# Patient Record
Sex: Female | Born: 1950 | Hispanic: No | Marital: Married | Smoking: Former smoker
Health system: Southern US, Community
[De-identification: ages and names within clinical notes are randomized; demographics above are authoritative.]

## PROBLEM LIST (undated history)

## (undated) DIAGNOSIS — Z87442 Personal history of urinary calculi: Secondary | ICD-10-CM

## (undated) DIAGNOSIS — I10 Essential (primary) hypertension: Secondary | ICD-10-CM

## (undated) DIAGNOSIS — F329 Major depressive disorder, single episode, unspecified: Secondary | ICD-10-CM

## (undated) DIAGNOSIS — F0280 Dementia in other diseases classified elsewhere without behavioral disturbance: Secondary | ICD-10-CM

## (undated) DIAGNOSIS — M069 Rheumatoid arthritis, unspecified: Secondary | ICD-10-CM

## (undated) DIAGNOSIS — C801 Malignant (primary) neoplasm, unspecified: Secondary | ICD-10-CM

## (undated) DIAGNOSIS — B029 Zoster without complications: Secondary | ICD-10-CM

## (undated) DIAGNOSIS — M1990 Unspecified osteoarthritis, unspecified site: Secondary | ICD-10-CM

## (undated) DIAGNOSIS — K769 Liver disease, unspecified: Secondary | ICD-10-CM

## (undated) DIAGNOSIS — H9190 Unspecified hearing loss, unspecified ear: Secondary | ICD-10-CM

## (undated) DIAGNOSIS — G309 Alzheimer's disease, unspecified: Secondary | ICD-10-CM

## (undated) DIAGNOSIS — E785 Hyperlipidemia, unspecified: Secondary | ICD-10-CM

## (undated) DIAGNOSIS — F32A Depression, unspecified: Secondary | ICD-10-CM

## (undated) DIAGNOSIS — N189 Chronic kidney disease, unspecified: Secondary | ICD-10-CM

## (undated) HISTORY — PX: WRIST SURGERY: SHX841

## (undated) HISTORY — PX: BUNIONECTOMY: SHX129

## (undated) HISTORY — DX: Dementia in other diseases classified elsewhere, unspecified severity, without behavioral disturbance, psychotic disturbance, mood disturbance, and anxiety: F02.80

## (undated) HISTORY — DX: Dementia in other diseases classified elsewhere, unspecified severity, without behavioral disturbance, psychotic disturbance, mood disturbance, and anxiety: G30.9

## (undated) HISTORY — PX: TONSILLECTOMY: SUR1361

## (undated) HISTORY — PX: URETHRAL DILATION: SUR417

## (undated) HISTORY — PX: FACIAL LACERATION REPAIR: SHX6589

## (undated) HISTORY — PX: ROTATOR CUFF REPAIR: SHX139

## (undated) HISTORY — PX: BREAST EXCISIONAL BIOPSY: SUR124

## (undated) HISTORY — PX: TRIGGER FINGER RELEASE: SHX641

## (undated) HISTORY — PX: FOOT SURGERY: SHX648

## (undated) HISTORY — PX: FACIAL FRACTURE SURGERY: SHX1570

## (undated) HISTORY — PX: KIDNEY SURGERY: SHX687

## (undated) HISTORY — PX: BACK SURGERY: SHX140

---

## 2004-04-24 ENCOUNTER — Ambulatory Visit: Payer: Self-pay | Admitting: Podiatry

## 2004-06-07 ENCOUNTER — Emergency Department: Payer: Self-pay | Admitting: Emergency Medicine

## 2005-12-15 ENCOUNTER — Ambulatory Visit: Payer: Self-pay | Admitting: General Practice

## 2007-05-09 ENCOUNTER — Ambulatory Visit: Payer: Self-pay | Admitting: Obstetrics and Gynecology

## 2007-05-30 ENCOUNTER — Ambulatory Visit: Payer: Self-pay | Admitting: Obstetrics and Gynecology

## 2007-12-21 ENCOUNTER — Ambulatory Visit: Payer: Self-pay | Admitting: Obstetrics and Gynecology

## 2008-02-28 ENCOUNTER — Emergency Department: Payer: Self-pay | Admitting: Emergency Medicine

## 2008-03-02 ENCOUNTER — Inpatient Hospital Stay: Payer: Self-pay | Admitting: Internal Medicine

## 2008-03-02 ENCOUNTER — Ambulatory Visit: Payer: Self-pay | Admitting: Cardiovascular Disease

## 2008-03-08 ENCOUNTER — Ambulatory Visit: Payer: Self-pay | Admitting: Family Medicine

## 2008-06-26 ENCOUNTER — Ambulatory Visit: Payer: Self-pay | Admitting: Unknown Physician Specialty

## 2009-07-01 ENCOUNTER — Ambulatory Visit: Payer: Self-pay | Admitting: Unknown Physician Specialty

## 2010-03-14 ENCOUNTER — Ambulatory Visit: Payer: Self-pay | Admitting: Sports Medicine

## 2010-03-27 ENCOUNTER — Ambulatory Visit: Payer: Self-pay | Admitting: Anesthesiology

## 2010-04-01 ENCOUNTER — Ambulatory Visit: Payer: Self-pay | Admitting: Unknown Physician Specialty

## 2010-07-06 ENCOUNTER — Ambulatory Visit: Payer: Self-pay | Admitting: Unknown Physician Specialty

## 2011-03-18 ENCOUNTER — Ambulatory Visit: Payer: Self-pay | Admitting: Anesthesiology

## 2011-03-18 LAB — URINALYSIS, COMPLETE
Bacteria: NONE SEEN
Blood: NEGATIVE
Glucose,UR: NEGATIVE mg/dL (ref 0–75)
Ph: 6 (ref 4.5–8.0)
Specific Gravity: 1.023 (ref 1.003–1.030)
Squamous Epithelial: 1

## 2011-03-18 LAB — CBC
HCT: 38.6 % (ref 35.0–47.0)
HGB: 13 g/dL (ref 12.0–16.0)
Platelet: 324 10*3/uL (ref 150–440)
WBC: 6.5 10*3/uL (ref 3.6–11.0)

## 2011-03-18 LAB — POTASSIUM: Potassium: 3.8 mmol/L (ref 3.5–5.1)

## 2011-03-22 ENCOUNTER — Ambulatory Visit: Payer: Self-pay | Admitting: Unknown Physician Specialty

## 2011-09-27 ENCOUNTER — Ambulatory Visit: Payer: Self-pay | Admitting: Gastroenterology

## 2012-07-12 ENCOUNTER — Ambulatory Visit: Payer: Self-pay | Admitting: Obstetrics and Gynecology

## 2014-06-03 ENCOUNTER — Encounter
Admit: 2014-06-03 | Disposition: A | Discharge status: Home / Self Care | Payer: Self-pay | Attending: Rheumatology | Admitting: Rheumatology

## 2014-06-23 NOTE — Op Note (Signed)
PATIENT NAME:  Meghan Myers, Meghan Myers MR#:  [id number] DATE OF BIRTH:  02-08-1951  DATE OF PROCEDURE:  03/22/2011  PREOPERATIVE DIAGNOSIS: Carpal tunnel syndrome, right wrist.   POSTOPERATIVE DIAGNOSIS: Carpal tunnel syndrome, right wrist.   PROCEDURE PERFORMED: Open carpal tunnel release on the right.   SURGEON: Kathrene Alu., M.D.   ANESTHESIA: General.   HISTORY: The patient has a long history of carpal tunnel-type symptoms referable to her right wrist. Nerve conductions verified the fact that there was compression of her median nerve, on the right. She was ultimately brought in for an open carpal tunnel release due to her refractoriness to conservative treatment.   DESCRIPTION OF PROCEDURE: The patient was taken to the Operating Room where satisfactory general anesthesia was achieved. A tourniquet was applied to the right upper arm. The right upper extremity was then prepped and draped in the usual fashion for a wrist procedure. Next, the right upper extremity was exsanguinated and the tourniquet was inflated. A slightly curved incision was made over the volar aspect of the distal half of the transverse carpal ligament. Dissection was carried down to the palmar fascia and the distal edge of the transverse carpal ligament. A hemostat was inserted underneath the ligament to protect the median nerve and then the ligament was completely divided. The distal half was divided with a knife and the proximal half was divided with scissors. The ligament was felt to have been completely divided. The nerve was somewhat flattened underneath the ligament. There was no evidence for proliferative synovitis or mass within the carpal tunnel space.   I went ahead and injected the subcutaneous tissue, in the incision, with about 10 mL of 0.5% Marcaine without epinephrine. The tourniquet was released. It had been up 9 minutes. Bleeding was controlled with digital pressure. The wound was irrigated with saline and then  closed the wound with 4-0 nylon sutures in vertical mattress fashion. Betadine was applied to the wound followed by a sterile dressing. A fiberglass volar splint was applied to the wrist slightly dorsiflexed.   The patient was then awakened and transferred to her stretcher bed. She was taken to the Recovery Room in satisfactory condition. Blood loss was negligible.  ____________________________ Kathrene Alu., MD hbk:slb D: 03/22/2011 14:20:47 ET T: 03/22/2011 16:15:01 ET JOB#: [id number]  cc: Kathrene Alu., MD, <Dictator> Vilinda Flake, Brooke Bonito MD ELECTRONICALLY SIGNED 03/28/2011 15:24

## 2014-10-08 ENCOUNTER — Observation Stay
Admission: EM | Admit: 2014-10-08 | Discharge: 2014-10-10 | Disposition: A | Discharge status: Home / Self Care | Payer: BLUE CROSS/BLUE SHIELD | Attending: Internal Medicine | Admitting: Internal Medicine

## 2014-10-08 ENCOUNTER — Emergency Department: Payer: BLUE CROSS/BLUE SHIELD

## 2014-10-08 ENCOUNTER — Encounter: Payer: Self-pay | Admitting: Urgent Care

## 2014-10-08 DIAGNOSIS — W5501XA Bitten by cat, initial encounter: Secondary | ICD-10-CM | POA: Diagnosis not present

## 2014-10-08 DIAGNOSIS — D72829 Elevated white blood cell count, unspecified: Secondary | ICD-10-CM | POA: Diagnosis not present

## 2014-10-08 DIAGNOSIS — Z87891 Personal history of nicotine dependence: Secondary | ICD-10-CM | POA: Diagnosis not present

## 2014-10-08 DIAGNOSIS — R51 Headache: Secondary | ICD-10-CM | POA: Diagnosis not present

## 2014-10-08 DIAGNOSIS — R600 Localized edema: Secondary | ICD-10-CM | POA: Diagnosis not present

## 2014-10-08 DIAGNOSIS — I10 Essential (primary) hypertension: Secondary | ICD-10-CM | POA: Insufficient documentation

## 2014-10-08 DIAGNOSIS — S51851A Open bite of right forearm, initial encounter: Secondary | ICD-10-CM | POA: Diagnosis not present

## 2014-10-08 DIAGNOSIS — S41151A Open bite of right upper arm, initial encounter: Secondary | ICD-10-CM | POA: Diagnosis present

## 2014-10-08 DIAGNOSIS — F329 Major depressive disorder, single episode, unspecified: Secondary | ICD-10-CM | POA: Insufficient documentation

## 2014-10-08 DIAGNOSIS — L03113 Cellulitis of right upper limb: Secondary | ICD-10-CM | POA: Insufficient documentation

## 2014-10-08 DIAGNOSIS — Z888 Allergy status to other drugs, medicaments and biological substances status: Secondary | ICD-10-CM | POA: Insufficient documentation

## 2014-10-08 DIAGNOSIS — M069 Rheumatoid arthritis, unspecified: Secondary | ICD-10-CM | POA: Insufficient documentation

## 2014-10-08 DIAGNOSIS — R509 Fever, unspecified: Secondary | ICD-10-CM | POA: Diagnosis not present

## 2014-10-08 DIAGNOSIS — Z882 Allergy status to sulfonamides status: Secondary | ICD-10-CM | POA: Insufficient documentation

## 2014-10-08 DIAGNOSIS — E785 Hyperlipidemia, unspecified: Secondary | ICD-10-CM | POA: Diagnosis present

## 2014-10-08 DIAGNOSIS — E876 Hypokalemia: Secondary | ICD-10-CM | POA: Diagnosis not present

## 2014-10-08 DIAGNOSIS — I891 Lymphangitis: Secondary | ICD-10-CM | POA: Diagnosis present

## 2014-10-08 DIAGNOSIS — R000 Tachycardia, unspecified: Secondary | ICD-10-CM | POA: Diagnosis not present

## 2014-10-08 DIAGNOSIS — F32A Depression, unspecified: Secondary | ICD-10-CM | POA: Diagnosis present

## 2014-10-08 DIAGNOSIS — Z7982 Long term (current) use of aspirin: Secondary | ICD-10-CM | POA: Insufficient documentation

## 2014-10-08 HISTORY — DX: Unspecified osteoarthritis, unspecified site: M19.90

## 2014-10-08 HISTORY — DX: Liver disease, unspecified: K76.9

## 2014-10-08 HISTORY — DX: Hyperlipidemia, unspecified: E78.5

## 2014-10-08 HISTORY — DX: Rheumatoid arthritis, unspecified: M06.9

## 2014-10-08 HISTORY — DX: Zoster without complications: B02.9

## 2014-10-08 HISTORY — DX: Major depressive disorder, single episode, unspecified: F32.9

## 2014-10-08 HISTORY — DX: Depression, unspecified: F32.A

## 2014-10-08 HISTORY — DX: Essential (primary) hypertension: I10

## 2014-10-08 LAB — COMPREHENSIVE METABOLIC PANEL
ALBUMIN: 4 g/dL (ref 3.5–5.0)
ALK PHOS: 62 U/L (ref 38–126)
ALT: 23 U/L (ref 14–54)
ANION GAP: 9 (ref 5–15)
AST: 37 U/L (ref 15–41)
BUN: 22 mg/dL — AB (ref 6–20)
CHLORIDE: 99 mmol/L — AB (ref 101–111)
CO2: 29 mmol/L (ref 22–32)
Calcium: 9.5 mg/dL (ref 8.9–10.3)
Creatinine, Ser: 0.71 mg/dL (ref 0.44–1.00)
GFR calc Af Amer: >60 mL/min (ref >60)
Glucose, Bld: 130 mg/dL — ABNORMAL HIGH (ref 65–99)
POTASSIUM: 3.7 mmol/L (ref 3.5–5.1)
Sodium: 137 mmol/L (ref 135–145)
Total Bilirubin: 0.6 mg/dL (ref 0.3–1.2)
Total Protein: 7.1 g/dL (ref 6.5–8.1)

## 2014-10-08 LAB — CBC WITH DIFFERENTIAL/PLATELET
Basophils Absolute: 0 10*3/uL (ref 0–0.1)
Basophils Relative: 0 %
EOS PCT: 0 %
Eosinophils Absolute: 0 10*3/uL (ref 0–0.7)
HEMATOCRIT: 39.8 % (ref 35.0–47.0)
Hemoglobin: 13.4 g/dL (ref 12.0–16.0)
LYMPHS ABS: 1 10*3/uL (ref 1.0–3.6)
Lymphocytes Relative: 8 %
MCH: 29.1 pg (ref 26.0–34.0)
MCHC: 33.7 g/dL (ref 32.0–36.0)
MCV: 86.3 fL (ref 80.0–100.0)
MONO ABS: 0.6 10*3/uL (ref 0.2–0.9)
MONOS PCT: 5 %
NEUTROS PCT: 87 %
Neutro Abs: 11.3 10*3/uL — ABNORMAL HIGH (ref 1.4–6.5)
PLATELETS: 247 10*3/uL (ref 150–440)
RBC: 4.62 MIL/uL (ref 3.80–5.20)
RDW: 13 % (ref 11.5–14.5)
WBC: 13 10*3/uL — ABNORMAL HIGH (ref 3.6–11.0)

## 2014-10-08 MED ORDER — PIPERACILLIN-TAZOBACTAM 3.375 G IVPB
3.3750 g | Freq: Once | INTRAVENOUS | Status: AC
  Administered 2014-10-08: 3.375 g via INTRAVENOUS

## 2014-10-08 MED ORDER — PIPERACILLIN SOD-TAZOBACTAM SO 3.375 (3-0.375) G IV SOLR
INTRAVENOUS | Status: AC
  Filled 2014-10-08: qty 3.38

## 2014-10-08 MED ORDER — CLINDAMYCIN PHOSPHATE 600 MG/50ML IV SOLN: 600.0000 mg | Freq: Once | INTRAVENOUS | Status: DC

## 2014-10-08 NOTE — ED Notes (Signed)
Patient presents with cat bite to RFA that happened yesterday. Arm with redness noted around puncture area. (+) fever at home with a tmax of 101.3. Patient was started on Augmentin this afternoon.

## 2014-10-08 NOTE — ED Notes (Signed)
Patient transported to Ultrasound 

## 2014-10-08 NOTE — ED Notes (Addendum)
Patient presents to with cat bite on right forearm. Patient reports her cat bit her yesterday evening, was seen at [facility] today and prescribed Augmentin. Patient reports redness and pain increasing this evening. Redness and swelling noted to right forearm, red streak noticed going up right arm. Skin hot to touch, reports fevers at home up to 101. Patient also c/o headache since today. Patient able to open and close hand but reports painful to do so. Patient states "my lymph nodes feel swollen," patient pointing to right armpit. Patient states cat has had his vaccines. Patient alert and oriented x 4, respirations even and unlabored. Call bell within reach, family at bedside.

## 2014-10-08 NOTE — H&P (Signed)
[facility] NAME: Felesha Moncrieffe    MR#:  [id number]  DATE OF BIRTH:  1950-03-09  DATE OF ADMISSION:  10/08/2014  PRIMARY CARE PHYSICIAN: Dion Body, MD   REQUESTING/REFERRING PHYSICIAN: Jacqualine Code, MD  CHIEF COMPLAINT:   Chief Complaint  Patient presents with  . Animal Bite    HISTORY OF PRESENT ILLNESS:  Meghan Myers  is a 64 y.o. female who presents with Cat Bite of the right upper extremity. Patient states that she was bitten by her cat just over 24 hours ago. She washed the puncture wound site with soap and water. However, earlier this morning she started to notice erythema around puncture sites. She went to a clinic to be seen, and was given Augmentin. However, her cellulitis progressed from a small area of about 2 cm in diameter this morning to a large confluent area covering his portion of her forearm and tracking up past her antecubital fossa to her proximal upper arm. She also noticed that she developed axillary lymph nodes on that side. So she decided to come to the ED for evaluation. She states that she did have a fever at home to 101.3, and here in the ED she was found to be mildly tachycardic at 103, although her temp here was only 99. Hospitalists were called for admission for cellulitis.  PAST MEDICAL HISTORY:   Past Medical History  Diagnosis Date  . Hypertension   . HLD (hyperlipidemia)   . Rheumatoid arthritis   . Liver disease   . Shingles   . Depression   . Osteoarthritis     PAST SURGICAL HISTORY:   Past Surgical History  Procedure Laterality Date  . Wrist surgery    . Bunionectomy Bilateral     SOCIAL HISTORY:   History  Substance Use Topics  . Smoking status: Former Smoker -- 0.50 packs/day for 4 years    Types: Cigarettes    Quit date: 10/07/1985  . Smokeless tobacco: Not on file  . Alcohol Use: 0.0 oz/week    0 Standard drinks or equivalent per week    FAMILY HISTORY:   Family  History  Problem Relation Age of Onset  . Hypertension Mother   . COPD Mother   . Stroke Mother   . Hypertension Father   . COPD Father   . Heart attack Father     DRUG ALLERGIES:   Allergies  Allergen Reactions  . Meloxicam Other (See Comments)    "disturbs my liver"   . Sulfasalazine     MEDICATIONS AT HOME:   Prior to Admission medications   Medication Sig Start Date End Date Taking? Authorizing Provider  amphetamine-dextroamphetamine (ADDERALL XR) 25 MG 24 hr capsule Take 25 mg by mouth every morning.   Yes Historical Provider, MD  aspirin 81 MG tablet Take 81 mg by mouth daily.   Yes Historical Provider, MD  atorvastatin (LIPITOR) 20 MG tablet Take 20 mg by mouth daily.   Yes Historical Provider, MD  hydrochlorothiazide (MICROZIDE) 12.5 MG capsule Take 12.5 mg by mouth daily.   Yes Historical Provider, MD  sertraline (ZOLOFT) 100 MG tablet Take 100 mg by mouth daily.   Yes Historical Provider, MD    REVIEW OF SYSTEMS:  Review of Systems  Constitutional: Positive for fever. Negative for chills, weight loss and malaise/fatigue.  HENT: Negative for ear pain, hearing loss and tinnitus.   Eyes: Negative for blurred vision, double vision, pain and redness.  Respiratory:  Negative for cough, hemoptysis and shortness of breath.   Cardiovascular: Negative for chest pain, palpitations, orthopnea and leg swelling.  Gastrointestinal: Negative for nausea, vomiting, abdominal pain, diarrhea and constipation.  Genitourinary: Negative for dysuria, frequency and hematuria.  Musculoskeletal: Negative for back pain, joint pain and neck pain.  Skin:       Right upper extremity erythema, No acne, or lesions  Neurological: Negative for dizziness, tremors, focal weakness and weakness.  Endo/Heme/Allergies: Negative for polydipsia. Does not bruise/bleed easily.  Psychiatric/Behavioral: Negative for depression. The patient is not nervous/anxious and does not have insomnia.      VITAL  SIGNS:   Filed Vitals:   10/08/14 2148  BP: 133/78  Pulse: 103  Temp: 99.4 F (37.4 C)  TempSrc: Oral  Resp: 18  Height: 5\' 2"  (1.575 m)  Weight: 63.504 kg (140 lb)  SpO2: 100%   Wt Readings from Last 3 Encounters:  10/08/14 63.504 kg (140 lb)    PHYSICAL EXAMINATION:  Physical Exam  Vitals reviewed. Constitutional: She is oriented to person, place, and time. She appears well-developed and well-nourished. No distress.  HENT:  Head: Normocephalic and atraumatic.  Mouth/Throat: Oropharynx is clear and moist.  Eyes: Conjunctivae and EOM are normal. Pupils are equal, round, and reactive to light. No scleral icterus.  Neck: Normal range of motion. Neck supple. No JVD present. No thyromegaly present.  Cardiovascular: Regular rhythm and intact distal pulses.  Exam reveals no gallop and no friction rub.   No murmur heard. Mild tachycardia  Respiratory: Effort normal and breath sounds normal. No respiratory distress. She has no wheezes. She has no rales.  GI: Soft. Bowel sounds are normal. She exhibits no distension. There is no tenderness.  Musculoskeletal: Normal range of motion. She exhibits no edema.  No arthritis, no gout  Lymphadenopathy:    She has no cervical adenopathy.  Neurological: She is alert and oriented to person, place, and time. No cranial nerve deficit.  No dysarthria, no aphasia  Skin: Skin is warm and dry. No rash noted. There is erythema (right upper extremity).  Psychiatric: She has a normal mood and affect. Her behavior is normal. Judgment and thought content normal.    LABORATORY PANEL:   CBC No results for input(s): WBC, HGB, HCT, PLT in the last 168 hours. ------------------------------------------------------------------------------------------------------------------  Chemistries  No results for input(s): NA, K, CL, CO2, GLUCOSE, BUN, CREATININE, CALCIUM, MG, AST, ALT, ALKPHOS, BILITOT in the last 168 hours.  Invalid input(s):  GFRCGP ------------------------------------------------------------------------------------------------------------------  Cardiac Enzymes No results for input(s): TROPONINI in the last 168 hours. ------------------------------------------------------------------------------------------------------------------  RADIOLOGY:  No results found.  EKG:   Orders placed or performed in visit on 03/27/10  . EKG 12-Lead    IMPRESSION AND PLAN:  Principal Problem:   Cellulitis of arm, right - ultrasound ordered in the ED to rule out any abscesses, Zosyn ordered, will continue this for now, blood culture sent. Active Problems:   Cat bite of right forearm - Zosyn to cover for feline oral bacteria   Depression - home meds   HLD (hyperlipidemia) - home meds   HTN (hypertension) - home dose HCTZ  All the records are reviewed and case discussed with ED provider. Management plans discussed with the patient and/or family.  DVT PROPHYLAXIS: mechanical only with early ambulation  ADMISSION STATUS: Observation  CODE STATUS: full  TOTAL TIME TAKING CARE OF THIS PATIENT: 40 minutes.    Eymi Lipuma FIELDING 10/08/2014, 11:07 PM  [facility]  Office  [phone]  CC: Primary care physician; Dion Body, MD

## 2014-10-08 NOTE — ED Provider Notes (Signed)
Medical screening examination/treatment/procedure(s) were conducted as a shared visit with non-physician practitioner(s) and myself.  I personally evaluated the patient during the encounter.  Personally saw to evaluate the patient. She has a low-grade fever associated with a cat bite. She has streaking cellulitis from the right forearm moving up into the left axilla with associated left axillary tenderness and adenopathy. Based on the rapid advancement sialitis, also already on Augmentin outpatient from a rabies immune cat. We will initiate her on IV abx admitted to the hospitalist for further observation of cellulitis.  Appears significant cellulitis likely due to cat scratch fever, with associated lymphangitic spread. Based on the rapid advancement of her illness, the patient's self report of fever to 101 and tachycardia in the ER we will admit her for IV antibiotic's.  Delman Kitten, MD 10/08/14 2239

## 2014-10-08 NOTE — ED Provider Notes (Signed)
[facility] Emergency Department Provider Note  ____________________________________________  Time seen: Approximately 10:36 PM  I have reviewed the triage vital signs and the nursing notes.   HISTORY  Chief Complaint Animal Bite   HPI Meghan Myers is a 64 y.o. female presenting to the emergency department with a cat bite to the right forearm sustained last night. She was seen at [facility] clinic earlier today and was started on Augmentin.  She noticed that the area around the bite was growing increasingly red and spreading as the day went on.  She has noticed a red streak up her forearm and pus coming from the initial wound.  She now has pain and swelling of the right axillary lymph node region, headache, and self-reported fever of 101.  She has full ROM of her hand and arm with some pain.   Past Medical History  Diagnosis Date  . Hypertension   . HLD (hyperlipidemia)   . Rheumatoid arthritis   . Liver disease   . Shingles   . Depression   . Osteoarthritis     Patient Active Problem List   Diagnosis Date Noted  . Cat bite of right forearm 10/08/2014  . Cellulitis of arm, right 10/08/2014  . Depression 10/08/2014  . HLD (hyperlipidemia) 10/08/2014  . HTN (hypertension) 10/08/2014    Past Surgical History  Procedure Laterality Date  . Wrist surgery    . Bunionectomy Bilateral     Current Outpatient Rx  Name  Route  Sig  Dispense  Refill  . amphetamine-dextroamphetamine (ADDERALL XR) 25 MG 24 hr capsule   Oral   Take 25 mg by mouth every morning.         Marland Kitchen aspirin 81 MG tablet   Oral   Take 81 mg by mouth daily.         Marland Kitchen atorvastatin (LIPITOR) 20 MG tablet   Oral   Take 20 mg by mouth daily.         . hydrochlorothiazide (MICROZIDE) 12.5 MG capsule   Oral   Take 12.5 mg by mouth daily.         . sertraline (ZOLOFT) 100 MG tablet   Oral   Take 100 mg by mouth daily.           Allergies Meloxicam and  Sulfasalazine  Family History  Problem Relation Age of Onset  . Hypertension Mother   . COPD Mother   . Stroke Mother   . Hypertension Father   . COPD Father   . Heart attack Father     Social History History  Substance Use Topics  . Smoking status: Former Smoker -- 0.50 packs/day for 4 years    Types: Cigarettes    Quit date: 10/07/1985  . Smokeless tobacco: Not on file  . Alcohol Use: 0.0 oz/week    0 Standard drinks or equivalent per week    Review of Systems Constitutional: Self-reported fever of 101 and headache. Eyes: No visual changes. ENT: No sore throat. Cardiovascular: Denies chest pain. Respiratory: Denies shortness of breath. Gastrointestinal: No abdominal pain.  No nausea, no vomiting.  No diarrhea.  No constipation. Genitourinary: Negative for dysuria. Musculoskeletal: Negative for back pain. Skin: Cat bite to the right forearm. Increasing redness and swelling of the right forearm with red streaking up toward the right armpit.  Neurological: Positive for headache. Negative for focal weakness or numbness.  10-point ROS otherwise negative.  ____________________________________________   PHYSICAL EXAM:  VITAL SIGNS: ED Triage Vitals  Enc Vitals Group     BP 10/08/14 2148 133/78 mmHg     Pulse Rate 10/08/14 2148 103     Resp 10/08/14 2148 18     Temp 10/08/14 2148 99.4 F (37.4 C)     Temp Source 10/08/14 2148 Oral     SpO2 10/08/14 2148 100 %     Weight 10/08/14 2148 140 lb (63.504 kg)     Height 10/08/14 2148 5\' 2"  (1.575 m)     Head Cir --      Peak Flow --      Pain Score 10/08/14 2149 6     Pain Loc --      Pain Edu? --      Excl. in [place]? --    Constitutional: Alert and oriented. Well appearing and in no acute distress. Eyes: Conjunctivae are normal. PERRL. EOMI. Head: Atraumatic. Nose: No congestion/rhinnorhea. Mouth/Throat: Mucous membranes are moist.  Oropharynx non-erythematous. Neck: No stridor.    Hematological/Lymphatic/Immunilogical: Right axillary lymphadenopathy with tenderness to palpation. Cardiovascular: Normal rate, regular rhythm. Grossly normal heart sounds.  Good peripheral circulation. Respiratory: Normal respiratory effort.  No retractions. Lungs CTAB. Gastrointestinal: Soft and nontender. No distention. No abdominal bruits. No CVA tenderness. Musculoskeletal: No lower extremity tenderness nor edema.  No joint effusions. Neurologic:  Normal speech and language. No gross focal neurologic deficits are appreciated. No gait instability. Skin:  Cat bite to the right forearm.  Cellulitis of the right forearm with erythema and warmth.  Lymphangitis noted up the right forearm toward the right axilla. Psychiatric: Mood and affect are normal. Speech and behavior are normal.  ____________________________________________   LABS (all labs ordered are listed, but only abnormal results are displayed)  Labs Reviewed  CULTURE, BLOOD (ROUTINE X 2)  CULTURE, BLOOD (ROUTINE X 2)  COMPREHENSIVE METABOLIC PANEL  CBC WITH DIFFERENTIAL/PLATELET   ____________________________________________   RADIOLOGY  Ultrasound right arm limited pending at the time of this note. ____________________________________________   PROCEDURES  Procedure(s) performed: None  Critical Care performed: No  ____________________________________________   INITIAL IMPRESSION / ASSESSMENT AND PLAN / ED COURSE  Pertinent labs & imaging results that were available during my care of the patient were reviewed by me and considered in my medical decision making (see chart for details).  Patient presented to the emergency room with worsening symptoms from a cat bite sustained last night. She started her first dose of Augmentin after being seen at [facility].  She now presents with cellulitis and lymphangitis of the right forearm.  Labs collected as above. IV Zosyn started in the emergency room, [place] of right  forearm completed to rule out abscess formation, and Hospitalist consulted for admission to the floor for further evaluation and observation. ____________________________________________   FINAL CLINICAL IMPRESSION(S) / ED DIAGNOSES  Final diagnoses:  Cat bite of right upper arm, initial encounter  Lymphangitis      Arlyss Repress, PA-C 10/08/14 [id number]  Delman Kitten, MD 10/08/14 2359

## 2014-10-09 LAB — CBC
HCT: 39.6 % (ref 35.0–47.0)
Hemoglobin: 13.2 g/dL (ref 12.0–16.0)
MCH: 28.9 pg (ref 26.0–34.0)
MCHC: 33.3 g/dL (ref 32.0–36.0)
MCV: 87 fL (ref 80.0–100.0)
Platelets: 232 10*3/uL (ref 150–440)
RBC: 4.55 MIL/uL (ref 3.80–5.20)
RDW: 12.9 % (ref 11.5–14.5)
WBC: 10.7 10*3/uL (ref 3.6–11.0)

## 2014-10-09 LAB — BASIC METABOLIC PANEL
Anion gap: 9 (ref 5–15)
BUN: 17 mg/dL (ref 6–20)
CO2: 29 mmol/L (ref 22–32)
CREATININE: 0.73 mg/dL (ref 0.44–1.00)
Calcium: 9.2 mg/dL (ref 8.9–10.3)
Chloride: 99 mmol/L — ABNORMAL LOW (ref 101–111)
GLUCOSE: 120 mg/dL — AB (ref 65–99)
Potassium: 3.1 mmol/L — ABNORMAL LOW (ref 3.5–5.1)
Sodium: 137 mmol/L (ref 135–145)

## 2014-10-09 LAB — MAGNESIUM: Magnesium: 1.6 mg/dL — ABNORMAL LOW (ref 1.7–2.4)

## 2014-10-09 MED ORDER — SERTRALINE HCL 50 MG PO TABS
100.0000 mg | ORAL_TABLET | Freq: Every day | ORAL | Status: DC
  Administered 2014-10-09: 09:00:00 100 mg via ORAL
  Filled 2014-10-09 (×2): qty 2

## 2014-10-09 MED ORDER — ONDANSETRON HCL 4 MG/2ML IJ SOLN: 4.0000 mg | Freq: Four times a day (QID) | INTRAMUSCULAR | Status: DC | PRN

## 2014-10-09 MED ORDER — HYDROCHLOROTHIAZIDE 12.5 MG PO CAPS
12.5000 mg | ORAL_CAPSULE | Freq: Every day | ORAL | Status: DC
  Administered 2014-10-09: 12.5 mg via ORAL
  Filled 2014-10-09 (×2): qty 1

## 2014-10-09 MED ORDER — ACETAMINOPHEN 325 MG PO TABS: 650.0000 mg | ORAL_TABLET | Freq: Four times a day (QID) | ORAL | Status: DC | PRN

## 2014-10-09 MED ORDER — POTASSIUM CHLORIDE 20 MEQ PO PACK
40.0000 meq | PACK | Freq: Once | ORAL | Status: AC
  Administered 2014-10-09: 10:00:00 40 meq via ORAL

## 2014-10-09 MED ORDER — PIPERACILLIN-TAZOBACTAM 4.5 G IVPB
4.5000 g | Freq: Three times a day (TID) | INTRAVENOUS | Status: DC
  Administered 2014-10-09 (×2): 4.5 g via INTRAVENOUS
  Filled 2014-10-09 (×5): qty 100

## 2014-10-09 MED ORDER — ATORVASTATIN CALCIUM 20 MG PO TABS
20.0000 mg | ORAL_TABLET | Freq: Every day | ORAL | Status: DC
  Administered 2014-10-09: 09:00:00 20 mg via ORAL
  Filled 2014-10-09 (×2): qty 1

## 2014-10-09 MED ORDER — HYDROCODONE-ACETAMINOPHEN 5-325 MG PO TABS: 1.0000 | ORAL_TABLET | ORAL | Status: DC | PRN

## 2014-10-09 MED ORDER — ONDANSETRON HCL 4 MG PO TABS: 4.0000 mg | ORAL_TABLET | Freq: Four times a day (QID) | ORAL | Status: DC | PRN

## 2014-10-09 MED ORDER — HYDROCODONE-ACETAMINOPHEN 5-325 MG PO TABS
ORAL_TABLET | ORAL | Status: AC
  Administered 2014-10-09: 01:00:00 2
  Filled 2014-10-09: qty 2

## 2014-10-09 MED ORDER — ASPIRIN 81 MG PO CHEW
81.0000 mg | CHEWABLE_TABLET | Freq: Every day | ORAL | Status: DC
  Administered 2014-10-09: 09:00:00 81 mg via ORAL
  Filled 2014-10-09: qty 1

## 2014-10-09 MED ORDER — PIPERACILLIN-TAZOBACTAM 3.375 G IVPB
3.3750 g | Freq: Three times a day (TID) | INTRAVENOUS | Status: DC
  Administered 2014-10-09 – 2014-10-10 (×2): 3.375 g via INTRAVENOUS
  Filled 2014-10-09 (×6): qty 50

## 2014-10-09 MED ORDER — SODIUM CHLORIDE 0.9 % IJ SOLN
3.0000 mL | Freq: Two times a day (BID) | INTRAMUSCULAR | Status: DC
  Administered 2014-10-09 (×3): 3 mL via INTRAVENOUS

## 2014-10-09 MED ORDER — ACETAMINOPHEN 650 MG RE SUPP: 650.0000 mg | Freq: Four times a day (QID) | RECTAL | Status: DC | PRN

## 2014-10-09 NOTE — Plan of Care (Signed)
Problem: Discharge Progression Outcomes Goal: Discharge plan in place and appropriate Individualism Outcome: Progressing Pt comes from home where her cat bit her. Arm redness and swelling in the 24 hours since she was bitten.    Goal: Other Discharge Outcomes/Goals Outcome: Progressing Plan of care progress to goal: Pain - pt has minimal pain Hemodynamically stable - VSS Complications - redness has not spread beyond marker lines as of this point in time Activity - pt up ad lib

## 2014-10-09 NOTE — Plan of Care (Signed)
Problem: Discharge Progression Outcomes Goal: Other Discharge Outcomes/Goals Outcome: Progressing Redness around cat bite has decreased and pain is minimal. Pt continued on IV abt and will change to PO tomorrow, likely discharge tomorrow home with husband.Vitals stable, minor elvated fever this afternoon 99.2. Increased PO intake of water and supplemental Potassium with  Po powder.

## 2014-10-09 NOTE — Progress Notes (Signed)
Pt Continued on Zosyn and will switch to oral Augmentin when clinically ready. No complaints of pain. Urine drug screen ordered, tech notified of UA order. VSS. Discharge home in 1-2 days per Dr's note.

## 2014-10-09 NOTE — Progress Notes (Signed)
[facility] NAME: Nona Gracey    MR#:  [id number]  DATE OF BIRTH:  [birth year]  SUBJECTIVE:  CHIEF COMPLAINT:   Chief Complaint  Patient presents with  . Animal Bite   Patient is a 64 year old female who presents to the hospital with fever to 101, tachycardia and leukocytosis after Bite in the Right Upper Extremity and Developing Erythema, Swelling and Painful Area around the Bite. Patient was initiated on antibiotic with Zosyn and admitted to the hospital. However, she did not notice he had significant improvement in erythema, swelling, site. But no significant progression was noted ROS  VITAL SIGNS: Blood pressure 119/50, pulse 85, temperature 98.7 F (37.1 C), temperature source Oral, resp. rate 18, height 5\' 2"  (1.575 m), weight 62.324 kg (137 lb 6.4 oz), SpO2 100 %.  PHYSICAL EXAMINATION:   GENERAL:  64 y.o.-year-old patient lying in the bed with no acute distress.  EYES: Pupils equal, round, reactive to light and accommodation. No scleral icterus. Extraocular muscles intact.  HEENT: Head atraumatic, normocephalic. Oropharynx and nasopharynx clear.  NECK:  Supple, no jugular venous distention. No thyroid enlargement, no tenderness.  LUNGS: Normal breath sounds bilaterally, no wheezing, rales,rhonchi or crepitation. No use of accessory muscles of respiration.  CARDIOVASCULAR: S1, S2 normal. No murmurs, rubs, or gallops.  ABDOMEN: Soft, nontender, nondistended. Bowel sounds present. No organomegaly or mass.  EXTREMITIES: No pedal edema, cyanosis, or clubbing. Right has extensive swelling around the antecubital area and forearm mental area with erythema, increased warmth as well as some discomfort on palpation extending tracking to the axilla, mild discomfort on active to palpation but no significant enlargement of lymph nodes were discovered. No purulence of fluctuation. Bite site was noted in forearm NEUROLOGIC: Cranial nerves  II through XII are intact. Muscle strength 5/5 in all extremities. Sensation intact. Gait not checked.  PSYCHIATRIC: The patient is alert and oriented x 3.  SKIN: No obvious rash, lesion, or ulcer.   ORDERS/RESULTS REVIEWED:   CBC  Recent Labs Lab 10/08/14 2255 10/09/14 0448  WBC 13.0* 10.7  HGB 13.4 13.2  HCT 39.8 39.6  PLT 247 232  MCV 86.3 87.0  MCH 29.1 28.9  MCHC 33.7 33.3  RDW 13.0 12.9  LYMPHSABS 1.0  --   MONOABS 0.6  --   EOSABS 0.0  --   BASOSABS 0.0  --    ------------------------------------------------------------------------------------------------------------------  Chemistries   Recent Labs Lab 10/08/14 2255 10/09/14 0448  NA 137 137  K 3.7 3.1*  CL 99* 99*  CO2 29 29  GLUCOSE 130* 120*  BUN 22* 17  CREATININE 0.71 0.73  CALCIUM 9.5 9.2  AST 37  --   ALT 23  --   ALKPHOS 62  --   BILITOT 0.6  --    ------------------------------------------------------------------------------------------------------------------ estimated creatinine clearance is 60.9 mL/min (by C-G formula based on Cr of 0.73). ------------------------------------------------------------------------------------------------------------------ No results for input(s): TSH, T4TOTAL, T3FREE, THYROIDAB in the last 72 hours.  Invalid input(s): FREET3  Cardiac Enzymes No results for input(s): CKMB, TROPONINI, MYOGLOBIN in the last 168 hours.  Invalid input(s): CK ------------------------------------------------------------------------------------------------------------------ Invalid input(s): POCBNP ---------------------------------------------------------------------------------------------------------------  RADIOLOGY: [place] Extrem Up Right Ltd  10/09/2014   CLINICAL DATA:  Swelling and redness right forearm due to cat bite. Inflammation extends from the bite site approximately 3 cm proximal to the wrist to the antecubital fossa  EXAM: ULTRASOUND RIGHT UPPER EXTREMITY LIMITED   TECHNIQUE: Ultrasound examination of the upper extremity soft tissues was  performed in the area of clinical concern in the right forearm region.  COMPARISON:  None  FINDINGS: There is no mass or abnormal fluid collection in the right forearm region. Focal soft tissue edema is noted at the site of the cat bite approximately 3 cm from the wrist. There is no appreciable air in the area of clinical concern.  IMPRESSION: Localized edema at the site of the bite approximately 3 cm from the wrist. No mass or abnormal fluid collection is seen in the right forearm. No abscess or soft tissue air appreciable.   Electronically Signed   By: Lowella Grip III M.D.   On: 10/09/2014 00:07    EKG:  Orders placed or performed in visit on 03/27/10  . EKG 12-Lead    ASSESSMENT AND PLAN:  Principal Problem:   Cellulitis of arm, right Active Problems:   Cat bite of right forearm   Depression   HLD (hyperlipidemia)   HTN (hypertension) 1. Right upper extremity cellulitis due to cats bite, continue patient on Zosyn. Follow clinically. Change her back to Augmentin orally when she is improving 2. Leukocytosis, resolved with therapy 3. Hypokalemia, supplement orally 4. History of essential hypertension, well-controlled on current medications   Management plans discussed with the patient, family and they are in agreement.   DRUG ALLERGIES:  Allergies  Allergen Reactions  . Meloxicam Other (See Comments)    "disturbs my liver"   . Sulfasalazine Nausea Only    CODE STATUS:     Code Status Orders        Start     Ordered   10/09/14 0021  Full code   Continuous     10/09/14 0020    Advance Directive Documentation        Most Recent Value   Type of Advance Directive  Living will, Healthcare Power of Attorney   Pre-existing out of facility DNR order (yellow form or pink MOST form)     "MOST" Form in Place?        TOTAL TIME TAKING CARE OF THIS PATIENT: 35 minutes.    Theodoro Grist M.D on  10/09/2014 at 9:10 AM  Between 7am to 6pm - Pager - [phone]  After 6pm go to [email or web address] - password [email or web address]  Office  [phone]  CC: Primary care physician; Dion Body, MD

## 2014-10-09 NOTE — Progress Notes (Signed)
ANTIBIOTIC CONSULT NOTE - FOLLOW UP  Pharmacy Consult for Zosyn Indication: cellulitis/cat bite  Allergies  Allergen Reactions  . Meloxicam Other (See Comments)    "disturbs my liver"   . Sulfasalazine Nausea Only    Patient Measurements: Height: 5\' 2"  (157.5 cm) Weight: 137 lb 6.4 oz (62.324 kg) IBW/kg (Calculated) : 50.1 Adjusted Body Weight:  Vital Signs: Temp: 99.3 F (37.4 C) (08/10 1225) Temp Source: Oral (08/10 1225) BP: 110/66 mmHg (08/10 1225) Pulse Rate: 88 (08/10 1225)  Labs:  Recent Labs  10/08/14 2255 10/09/14 0448  WBC 13.0* 10.7  HGB 13.4 13.2  PLT 247 232  CREATININE 0.71 0.73   Estimated Creatinine Clearance: 60.9 mL/min (by C-G formula based on Cr of 0.73).   Microbiology: Recent Results (from the past 720 hour(s))  Culture, blood (routine x 2)     Status: None (Preliminary result)   Collection Time: 10/08/14 10:56 PM  Result Value Ref Range Status   Specimen Description BLOOD LEFT ASSIST CONTROL  Final   Special Requests   Final    BOTTLES DRAWN AEROBIC AND ANAEROBIC 1CCANAEROBIC, 3CCAEROBIC   Culture NO GROWTH < 12 HOURS  Final   Report Status PENDING  Incomplete  Culture, blood (routine x 2)     Status: None (Preliminary result)   Collection Time: 10/08/14 11:07 PM  Result Value Ref Range Status   Specimen Description BLOOD LEFT ASSIST CONTROL  Final   Special Requests   Final    BOTTLES DRAWN AEROBIC AND ANAEROBIC 1CCANAEROBIC, 3CCAEROBIC   Culture NO GROWTH < 12 HOURS  Final   Report Status PENDING  Incomplete    Anti-infectives    Start     Dose/Rate Route Frequency Ordered Stop   10/09/14 2200  piperacillin-tazobactam (ZOSYN) IVPB 3.375 g     3.375 g 12.5 mL/hr over 240 Minutes Intravenous 3 times per day 10/09/14 1520     10/09/14 0600  piperacillin-tazobactam (ZOSYN) IVPB 4.5 g  Status:  Discontinued     4.5 g 200 mL/hr over 30 Minutes Intravenous 3 times per day 10/09/14 0329 10/09/14 1520   10/08/14 2307   piperacillin-tazobactam (ZOSYN) 3.375 (3-0.375) G injection    Comments:  rodriguez, jeanina: cabinet override      10/08/14 2307 10/08/14 2343   10/08/14 2245  clindamycin (CLEOCIN) IVPB 600 mg  Status:  Discontinued     600 mg 100 mL/hr over 30 Minutes Intravenous  Once 10/08/14 2235 10/08/14 2239   10/08/14 2245  piperacillin-tazobactam (ZOSYN) IVPB 3.375 g     3.375 g 12.5 mL/hr over 240 Minutes Intravenous  Once 10/08/14 2239 10/09/14 0341      Assessment: 64 yo F with cellulitis around cat bite on arm.  Plan:  Will transition to EI Zosyn 3.375gm IV Q8h for empiric coverage of animal bite. F/U cultures.  Chinita Greenland PharmD Clinical Pharmacist 10/09/2014

## 2014-10-09 NOTE — Progress Notes (Signed)
ANTIBIOTIC CONSULT NOTE - INITIAL  Pharmacy Consult for Zosyn dosing Indication: cellulitis  Allergies  Allergen Reactions  . Meloxicam Other (See Comments)    "disturbs my liver"   . Sulfasalazine Nausea Only    Patient Measurements: Height: 5\' 2"  (157.5 cm) Weight: 137 lb 6.4 oz (62.324 kg) IBW/kg (Calculated) : 50.1 Adjusted Body Weight: n/a  Vital Signs: Temp: 99.3 F (37.4 C) (08/10 0024) Temp Source: Oral (08/10 0024) BP: 149/56 mmHg (08/10 0024) Pulse Rate: 86 (08/10 0024) Intake/Output from previous day:   Intake/Output from this shift:    Labs:  Recent Labs  10/08/14 2255  WBC 13.0*  HGB 13.4  PLT 247  CREATININE 0.71   Estimated Creatinine Clearance: 60.9 mL/min (by C-G formula based on Cr of 0.71). No results for input(s): VANCOTROUGH, VANCOPEAK, VANCORANDOM, GENTTROUGH, GENTPEAK, GENTRANDOM, TOBRATROUGH, TOBRAPEAK, TOBRARND, AMIKACINPEAK, AMIKACINTROU, AMIKACIN in the last 72 hours.   Microbiology: No results found for this or any previous visit (from the past 720 hour(s)).  Medical History: Past Medical History  Diagnosis Date  . Hypertension   . HLD (hyperlipidemia)   . Rheumatoid arthritis   . Liver disease   . Shingles   . Depression   . Osteoarthritis     Medications:   Assessment: Blood cx pending  Goal of Therapy:  Resolve infection  Plan:  Zosyn 4.5 grams q 8 hours ordered for Pseudomonas risk of recent abx use as outpatient.   Sim Boast, PharmD, BCPS  10/09/2014

## 2014-10-10 DIAGNOSIS — E876 Hypokalemia: Secondary | ICD-10-CM

## 2014-10-10 DIAGNOSIS — D72829 Elevated white blood cell count, unspecified: Secondary | ICD-10-CM

## 2014-10-10 MED ORDER — HYDROCODONE-ACETAMINOPHEN 5-325 MG PO TABS: 1.0000 | ORAL_TABLET | ORAL | Status: DC | PRN

## 2014-10-10 MED ORDER — MAGNESIUM SULFATE 2 GM/50ML IV SOLN
2.0000 g | Freq: Once | INTRAVENOUS | Status: AC
  Administered 2014-10-10: 08:00:00 2 g via INTRAVENOUS
  Filled 2014-10-10: qty 50

## 2014-10-10 NOTE — Discharge Instructions (Signed)
Animal Bite Animal bite wounds can get infected. It is important to get proper medical treatment. Ask your doctor if you need a rabies shot. HOME CARE   Follow your doctor's instructions for taking care of your wound.  Only take medicine as told by your doctor.  Take your medicine (antibiotics) as told. Finish them even if you start to feel better.  Keep all doctor visits as told. You may need a tetanus shot if:   You cannot remember when you had your last tetanus shot.  You have never had a tetanus shot.  The injury broke your skin. If you need a tetanus shot and you choose not to have one, you may get tetanus. Sickness from tetanus can be serious. GET HELP RIGHT AWAY IF:   Your wound is warm, red, sore, or puffy (swollen).  You notice yellowish-white fluid (pus) or a bad smell coming from the wound.  You see a red line on the skin coming from the wound.  You have a fever, chills, or you feel sick.  You feel sick to your stomach (nauseous), or you throw up (vomit).  Your pain does not go away, or it gets worse.  You have trouble moving the injured part.  You have questions or concerns. MAKE SURE YOU:   Understand these instructions.  Will watch your condition.  Will get help right away if you are not doing well or get worse. Document Released: 02/15/2005 Document Revised: 05/10/2011 Document Reviewed: 10/07/2010 [facility] Patient Information 2015 [place], [place]. This information is not intended to replace advice given to you by your health care provider. Make sure you discuss any questions you have with your health care provider.  Cat Scratch Disease Cats often injure people by scratching or biting. This site of injury can become infected with a particular germ or bacteria present in the mouth of or on the cat. This germ is called Bartonella henselae. This infection is identified by the common name cat scratch disease (CSD).  SYMPTOMS  A red and sore pimple or bump,  with or without pus, on the skin where the cat scratched or bit. The pimple or sore may be present for as long as three weeks after the scratch or bite occurred.  One or more enlarged lymph glands located toward the center of the body from where the injury occurred.  Less common symptoms include low-grade fever, tiredness, fatigue, headache and/or sore throat. DIAGNOSIS  The diagnosis is typically made by your caregiver who notes the history of a scratch or bite from a cat, and finds the skin sore and swollen lymph glands in the described area.  Culture of any drainage or pus from the injury site, or a needle aspiration or piece of tissue (biopsy) from a swollen lymph gland may also be done to confirm the diagnosis and assure that a different infection or disease is not causing your illness. Rare but serious complications may occur, they include:  Parinaud's syndrome - fever, swollen lymph glands and inflammation of the eye (conjunctivitis).  Infection of the brain (encephalitis).  Infection of the nerve of the eye (neuroretinitis).  Infection of the bone (osteomyelitis). TREATMENT  Usually treatment is not necessary or helpful, especially if you have a normal immune system. When infection is very severe, it may be treated with a medicine that kills the bacteria (antibiotic).  People with immune system problems (such as having AIDS or an organ transplant, or being on steroids or other immune modifying drugs) should be  treated with antibiotics. HOME CARE INSTRUCTIONS   Avoid injury while playing with cats.  Wash well after playing with cats.  Do not let your cat lick sores on your body.  Do not let your cat roam around outside of your house.  Keep the area of the cat scratch clean. Wash it with soap and water or apply an antiseptic solution such as povidone iodine.  You should get a tetanus shot if you have not had one in the past 5 or 10 years. If you receive one, your arm may get  swollen and red and warm to the touch at the shot site. This is a common response to the medication in the shot. If you did not receive a tetanus shot here because you did not recall when your last one was given, make sure to check with your caregiver's office and determine if one is needed. Generally, for a "dirty" wound, you should receive a tetanus booster if you have not had one in the last five years. If you have a "clean" wound, you should receive a tetanus booster if you have not had one in the last ten years. SEEK IMMEDIATE MEDICAL CARE IF:   You have worsening signs of infection, such as more redness, increased pain, red streaking or pus coming from the wound, or warmth or swelling around the area of the scratch.  You develop worsening swollen lymph glands.  You develop abdominal pain, have problems with your vision or develop a skin rash.  You have a fever.  You become more tired or dizzy, or have a worsening headache.  You develop inflammation of your eye or have increasing vision problems.  You have pain in one of your bones.  You develop a stiff neck.  You pass out. MAKE SURE YOU:   Understand these instructions.  Will watch your condition.  Will get help right away if you are not doing well or get worse. Document Released: 02/13/2000 Document Revised: 05/10/2011 Document Reviewed: 03/27/2008 [facility] Patient Information 2015 [place], [place]. This information is not intended to replace advice given to you by your health care provider. Make sure you discuss any questions you have with your health care provider.

## 2014-10-10 NOTE — Discharge Summary (Signed)
[facility] NAME: Meghan Myers    MR#:  [id number]  DATE OF BIRTH:  11/07/1950  DATE OF ADMISSION:  10/08/2014 ADMITTING PHYSICIAN: Lance Coon, MD  DATE OF DISCHARGE: 10/10/2014 12:51 PM  PRIMARY CARE PHYSICIAN: Dion Body, MD     ADMISSION DIAGNOSIS:  Lymphangitis [I89.1] Cat bite [W55.01XA] Cat bite of right upper arm, initial encounter [S41.151A, W55.01XA]  DISCHARGE DIAGNOSIS:  Principal Problem:   Cellulitis of arm, right Active Problems:   Cat bite of right forearm   Leukocytosis   Hypokalemia   Depression   HLD (hyperlipidemia)   HTN (hypertension)   SECONDARY DIAGNOSIS:   Past Medical History  Diagnosis Date  . Hypertension   . HLD (hyperlipidemia)   . Rheumatoid arthritis   . Liver disease   . Shingles   . Depression   . Osteoarthritis     .pro HOSPITAL COURSE:   This tap is 64 year old Caucasian female who was bitten by a cat in her right forearm and presents to the hospital with complaints of erythema, swelling and pain in the area of bite and surrounding area. She was diagnosed with cellulitis and admitted to the hospital for Zosyn infusion. On arrival to the hospital, she was noted to have mildly elevated white blood cell count to 13,000. With Zosyn, patient's white blood cell count normalized by the day of discharge. Erythema also subsided. However, patient still had some swelling in forearm on the day of discharge. It was felt that patient is stable to be discharged home for Augmentin therapy today on 10/10/2014. Patient was advised to continue Augmentin for 10 more days to complete course Discussion by problem 1. Right upper extremity cellulitis due to cat's bite, discontinue Zosyn. Some improvement clinically. Continue Augmentin orally for 10 more days to complete course 2. Leukocytosis, resolved with therapy 3. Hypokalemia, supplemented orally 4. History of essential hypertension,  relatively well controlled on current medications  DISCHARGE CONDITIONS:   Fair  CONSULTS OBTAINED:     DRUG ALLERGIES:   Allergies  Allergen Reactions  . Meloxicam Other (See Comments)    "disturbs my liver"   . Sulfasalazine Nausea Only    DISCHARGE MEDICATIONS:   Discharge Medication List as of 10/10/2014  8:43 AM    START taking these medications   Details  HYDROcodone-acetaminophen (NORCO/VICODIN) 5-325 MG per tablet Take 1-2 tablets by mouth every 4 (four) hours as needed for moderate pain., Starting 10/10/2014, Until Discontinued, Print      CONTINUE these medications which have NOT CHANGED   Details  amoxicillin-clavulanate (AUGMENTIN) 875-125 MG per tablet Take 1 tablet by mouth 2 (two) times daily. Just got rx today., Until Discontinued, Historical Med    amphetamine-dextroamphetamine (ADDERALL XR) 25 MG 24 hr capsule Take 25 mg by mouth every morning., Until Discontinued, Historical Med    aspirin 81 MG chewable tablet Chew 81 mg by mouth daily., Until Discontinued, Historical Med    atorvastatin (LIPITOR) 20 MG tablet Take 20 mg by mouth daily., Until Discontinued, Historical Med    calcium carbonate (TUMS - DOSED IN MG ELEMENTAL CALCIUM) 500 MG chewable tablet Chew 1 tablet by mouth daily., Until Discontinued, Historical Med    hydrochlorothiazide (MICROZIDE) 12.5 MG capsule Take 12.5 mg by mouth daily., Until Discontinued, Historical Med    Omega-3 Fatty Acids (FISH OIL) 1000 MG CAPS Take 1 capsule by mouth daily., Until Discontinued, Historical Med    sertraline (ZOLOFT) 100 MG tablet Take 100  mg by mouth daily., Until Discontinued, Historical Med      STOP taking these medications     aspirin 81 MG tablet          DISCHARGE INSTRUCTIONS:    Patient is to follow-up with her primary care physician, Dr. Richarda Overlie in the next 2 or 3 days after discharge  If you experience worsening of your admission symptoms, develop shortness of breath, life  threatening emergency, suicidal or homicidal thoughts you must seek medical attention immediately by calling 911 or calling your MD immediately  if symptoms less severe.  You Must read complete instructions/literature along with all the possible adverse reactions/side effects for all the Medicines you take and that have been prescribed to you. Take any new Medicines after you have completely understood and accept all the possible adverse reactions/side effects.   Please note  You were cared for by a hospitalist during your hospital stay. If you have any questions about your discharge medications or the care you received while you were in the hospital after you are discharged, you can call the unit and asked to speak with the hospitalist on call if the hospitalist that took care of you is not available. Once you are discharged, your primary care physician will handle any further medical issues. Please note that NO REFILLS for any discharge medications will be authorized once you are discharged, as it is imperative that you return to your primary care physician (or establish a relationship with a primary care physician if you do not have one) for your aftercare needs so that they can reassess your need for medications and monitor your lab values.    Today   CHIEF COMPLAINT:   Chief Complaint  Patient presents with  . Animal Bite    HISTORY OF PRESENT ILLNESS:  Meghan Myers  is a 64 y.o. female with a known history of essential hypertension who presents to the hospital with complaints of right upper extremity swelling, redness as well as pain after cat's bite in the forearm. She was diagnosed with cellulitis and admitted to the hospital for Zosyn infusion. On arrival to the hospital, she was noted to have mildly elevated white blood cell count to 13,000. With Zosyn, patient's white blood cell count normalized by the day of discharge. Erythema also subsided. However, patient still had some swelling in  forearm on the day of discharge. It was felt that patient is stable to be discharged home for Augmentin therapy today on 10/10/2014. Patient was advised to continue Augmentin for 10 more days to complete course Discussion by problem 1. Right upper extremity cellulitis due to cat's bite, discontinue Zosyn. Some improvement clinically. Continue Augmentin orally for 10 more days to complete course 2. Leukocytosis, resolved with therapy 3. Hypokalemia, supplemented orally 4. History of essential hypertension, relatively well controlled on current medications   VITAL SIGNS:  Blood pressure 147/49, pulse 80, temperature 98.1 F (36.7 C), temperature source Oral, resp. rate 16, height 5\' 2"  (1.575 m), weight 62.324 kg (137 lb 6.4 oz), SpO2 97 %.  I/O:   Intake/Output Summary (Last 24 hours) at 10/10/14 1429 Last data filed at 10/10/14 0900  Gross per 24 hour  Intake    480 ml  Output      0 ml  Net    480 ml    PHYSICAL EXAMINATION:  GENERAL:  64 y.o.-year-old patient lying in the bed with no acute distress.  EYES: Pupils equal, round, reactive to light and accommodation. No scleral  icterus. Extraocular muscles intact.  HEENT: Head atraumatic, normocephalic. Oropharynx and nasopharynx clear.  NECK:  Supple, no jugular venous distention. No thyroid enlargement, no tenderness.  LUNGS: Normal breath sounds bilaterally, no wheezing, rales,rhonchi or crepitation. No use of accessory muscles of respiration.  CARDIOVASCULAR: S1, S2 normal. No murmurs, rubs, or gallops.  ABDOMEN: Soft, non-tender, non-distended. Bowel sounds present. No organomegaly or mass.  EXTREMITIES: No pedal edema, cyanosis, or clubbing. Right upper extremity revealed forearm swelling and redness which is subsiding proximally, increased warmth was noted NEUROLOGIC: Cranial nerves II through XII are intact. Muscle strength 5/5 in all extremities. Sensation intact. Gait not checked.  PSYCHIATRIC: The patient is alert and  oriented x 3.  SKIN: No obvious rash, lesion, or ulcer.   DATA REVIEW:   CBC  Recent Labs Lab 10/09/14 0448  WBC 10.7  HGB 13.2  HCT 39.6  PLT 232    Chemistries   Recent Labs Lab 10/08/14 2255 10/09/14 0448  NA 137 137  K 3.7 3.1*  CL 99* 99*  CO2 29 29  GLUCOSE 130* 120*  BUN 22* 17  CREATININE 0.71 0.73  CALCIUM 9.5 9.2  MG  --  1.6*  AST 37  --   ALT 23  --   ALKPHOS 62  --   BILITOT 0.6  --     Cardiac Enzymes No results for input(s): TROPONINI in the last 168 hours.  Microbiology Results  Results for orders placed or performed during the hospital encounter of 10/08/14  Culture, blood (routine x 2)     Status: None (Preliminary result)   Collection Time: 10/08/14 10:56 PM  Result Value Ref Range Status   Specimen Description BLOOD LEFT ASSIST CONTROL  Final   Special Requests   Final    BOTTLES DRAWN AEROBIC AND ANAEROBIC 1CCANAEROBIC, 3CCAEROBIC   Culture NO GROWTH 2 DAYS  Final   Report Status PENDING  Incomplete  Culture, blood (routine x 2)     Status: None (Preliminary result)   Collection Time: 10/08/14 11:07 PM  Result Value Ref Range Status   Specimen Description BLOOD LEFT ASSIST CONTROL  Final   Special Requests   Final    BOTTLES DRAWN AEROBIC AND ANAEROBIC 1CCANAEROBIC, 3CCAEROBIC   Culture NO GROWTH 2 DAYS  Final   Report Status PENDING  Incomplete    RADIOLOGY:  [place] Extrem Up Right Ltd  10/09/2014   CLINICAL DATA:  Swelling and redness right forearm due to cat bite. Inflammation extends from the bite site approximately 3 cm proximal to the wrist to the antecubital fossa  EXAM: ULTRASOUND RIGHT UPPER EXTREMITY LIMITED  TECHNIQUE: Ultrasound examination of the upper extremity soft tissues was performed in the area of clinical concern in the right forearm region.  COMPARISON:  None  FINDINGS: There is no mass or abnormal fluid collection in the right forearm region. Focal soft tissue edema is noted at the site of the cat bite approximately  3 cm from the wrist. There is no appreciable air in the area of clinical concern.  IMPRESSION: Localized edema at the site of the bite approximately 3 cm from the wrist. No mass or abnormal fluid collection is seen in the right forearm. No abscess or soft tissue air appreciable.   Electronically Signed   By: Lowella Grip III M.D.   On: 10/09/2014 00:07    EKG:   Orders placed or performed in visit on 03/27/10  . EKG 12-Lead      Management plans discussed  with the patient, family and they are in agreement.  CODE STATUS:     Code Status Orders        Start     Ordered   10/09/14 0021  Full code   Continuous     10/09/14 0020    Advance Directive Documentation        Most Recent Value   Type of Advance Directive  Living will, Healthcare Power of Attorney   Pre-existing out of facility DNR order (yellow form or pink MOST form)     "MOST" Form in Place?        TOTAL TIME TAKING CARE OF THIS PATIENT: 40 minutes.    Theodoro Grist M.D on 10/10/2014 at 2:29 PM  Between 7am to 6pm - Pager - [phone]  After 6pm go to [email or web address] - password [email or web address] [facility]  Office  [phone]  CC: Primary care physician; Dion Body, MD

## 2014-10-10 NOTE — Plan of Care (Signed)
Problem: Discharge Progression Outcomes Goal: Other Discharge Outcomes/Goals Outcome: Completed/Met Date Met:  10/10/14 Cellulitis in right arm still remains red and slightly warm but does not complain of any discomfort in that area. Eating well and no problems with voiding or bowel movements. Planning for discharge. Reviewed discharge information and understands all information.

## 2014-10-10 NOTE — Plan of Care (Signed)
Problem: Discharge Progression Outcomes Goal: Discharge plan in place and appropriate Individualism Outcome: Progressing Pt comes from home where her cat bit her. Swelling and redness has decreased since admission.    Goal: Other Discharge Outcomes/Goals Outcome: Progressing Plan of care progress to goal: Pain - pt complains of no pain Complications - redness and swelling have improved Activity - pt up ad lib

## 2014-10-13 LAB — CULTURE, BLOOD (ROUTINE X 2)
CULTURE: NO GROWTH
Culture: NO GROWTH

## 2015-01-27 DIAGNOSIS — C4492 Squamous cell carcinoma of skin, unspecified: Secondary | ICD-10-CM

## 2015-01-27 HISTORY — DX: Squamous cell carcinoma of skin, unspecified: C44.92

## 2016-03-19 DIAGNOSIS — F900 Attention-deficit hyperactivity disorder, predominantly inattentive type: Secondary | ICD-10-CM | POA: Insufficient documentation

## 2016-10-30 DIAGNOSIS — C801 Malignant (primary) neoplasm, unspecified: Secondary | ICD-10-CM

## 2016-10-30 HISTORY — DX: Malignant (primary) neoplasm, unspecified: C80.1

## 2016-11-12 ENCOUNTER — Emergency Department: Payer: Medicare Other

## 2016-11-12 ENCOUNTER — Emergency Department
Admission: EM | Admit: 2016-11-12 | Discharge: 2016-11-12 | Disposition: A | Discharge status: Other Acute Inpatient Hospital | Payer: Medicare Other | Attending: Emergency Medicine | Admitting: Emergency Medicine

## 2016-11-12 ENCOUNTER — Encounter: Payer: Self-pay | Admitting: Medical Oncology

## 2016-11-12 DIAGNOSIS — W01190A Fall on same level from slipping, tripping and stumbling with subsequent striking against furniture, initial encounter: Secondary | ICD-10-CM | POA: Insufficient documentation

## 2016-11-12 DIAGNOSIS — Y9389 Activity, other specified: Secondary | ICD-10-CM | POA: Diagnosis not present

## 2016-11-12 DIAGNOSIS — S27322A Contusion of lung, bilateral, initial encounter: Secondary | ICD-10-CM

## 2016-11-12 DIAGNOSIS — Y999 Unspecified external cause status: Secondary | ICD-10-CM | POA: Diagnosis not present

## 2016-11-12 DIAGNOSIS — I10 Essential (primary) hypertension: Secondary | ICD-10-CM | POA: Diagnosis not present

## 2016-11-12 DIAGNOSIS — Y92000 Kitchen of unspecified non-institutional (private) residence as  the place of occurrence of the external cause: Secondary | ICD-10-CM | POA: Diagnosis not present

## 2016-11-12 DIAGNOSIS — S270XXA Traumatic pneumothorax, initial encounter: Secondary | ICD-10-CM

## 2016-11-12 DIAGNOSIS — S36039A Unspecified laceration of spleen, initial encounter: Secondary | ICD-10-CM

## 2016-11-12 DIAGNOSIS — Z79899 Other long term (current) drug therapy: Secondary | ICD-10-CM | POA: Diagnosis not present

## 2016-11-12 DIAGNOSIS — J942 Hemothorax: Secondary | ICD-10-CM | POA: Diagnosis not present

## 2016-11-12 DIAGNOSIS — Z87891 Personal history of nicotine dependence: Secondary | ICD-10-CM | POA: Diagnosis not present

## 2016-11-12 DIAGNOSIS — S299XXA Unspecified injury of thorax, initial encounter: Secondary | ICD-10-CM | POA: Diagnosis present

## 2016-11-12 DIAGNOSIS — S32009A Unspecified fracture of unspecified lumbar vertebra, initial encounter for closed fracture: Secondary | ICD-10-CM

## 2016-11-12 DIAGNOSIS — Z7982 Long term (current) use of aspirin: Secondary | ICD-10-CM | POA: Diagnosis not present

## 2016-11-12 DIAGNOSIS — S2242XA Multiple fractures of ribs, left side, initial encounter for closed fracture: Secondary | ICD-10-CM | POA: Diagnosis not present

## 2016-11-12 LAB — CBC WITH DIFFERENTIAL/PLATELET
BASOS PCT: 0 %
Basophils Absolute: 0 10*3/uL (ref 0–0.1)
EOS ABS: 0 10*3/uL (ref 0–0.7)
EOS PCT: 0 %
HCT: 38.6 % (ref 35.0–47.0)
HEMOGLOBIN: 13.2 g/dL (ref 12.0–16.0)
Lymphocytes Relative: 7 %
Lymphs Abs: 0.8 10*3/uL — ABNORMAL LOW (ref 1.0–3.6)
MCH: 30.5 pg (ref 26.0–34.0)
MCHC: 34.3 g/dL (ref 32.0–36.0)
MCV: 89.1 fL (ref 80.0–100.0)
Monocytes Absolute: 0.6 10*3/uL (ref 0.2–0.9)
Monocytes Relative: 5 %
NEUTROS PCT: 88 %
Neutro Abs: 11.5 10*3/uL — ABNORMAL HIGH (ref 1.4–6.5)
PLATELETS: 289 10*3/uL (ref 150–440)
RBC: 4.33 MIL/uL (ref 3.80–5.20)
RDW: 13.1 % (ref 11.5–14.5)
WBC: 13 10*3/uL — AB (ref 3.6–11.0)

## 2016-11-12 LAB — HEPATIC FUNCTION PANEL
ALBUMIN: 3.8 g/dL (ref 3.5–5.0)
ALT: 30 U/L (ref 14–54)
AST: 39 U/L (ref 15–41)
Alkaline Phosphatase: 64 U/L (ref 38–126)
Bilirubin, Direct: <0.1 mg/dL — ABNORMAL LOW (ref 0.1–0.5)
TOTAL PROTEIN: 6.6 g/dL (ref 6.5–8.1)
Total Bilirubin: 0.6 mg/dL (ref 0.3–1.2)

## 2016-11-12 LAB — BASIC METABOLIC PANEL
Anion gap: 10 (ref 5–15)
BUN: 25 mg/dL — AB (ref 6–20)
CALCIUM: 9.3 mg/dL (ref 8.9–10.3)
CO2: 28 mmol/L (ref 22–32)
CREATININE: 0.58 mg/dL (ref 0.44–1.00)
Chloride: 100 mmol/L — ABNORMAL LOW (ref 101–111)
GFR calc non Af Amer: >60 mL/min (ref >60)
Glucose, Bld: 115 mg/dL — ABNORMAL HIGH (ref 65–99)
Potassium: 3.5 mmol/L (ref 3.5–5.1)
SODIUM: 138 mmol/L (ref 135–145)

## 2016-11-12 LAB — PROTIME-INR
INR: 1.07
PROTHROMBIN TIME: 13.8 s (ref 11.4–15.2)

## 2016-11-12 MED ORDER — KETAMINE HCL 10 MG/ML IJ SOLN
100.0000 mg | Freq: Once | INTRAMUSCULAR | Status: AC
  Administered 2016-11-12: 6 mg via INTRAVENOUS
  Filled 2016-11-12: qty 1

## 2016-11-12 MED ORDER — MORPHINE SULFATE (PF) 4 MG/ML IV SOLN
4.0000 mg | Freq: Once | INTRAVENOUS | Status: AC
  Administered 2016-11-12: 4 mg via INTRAVENOUS
  Filled 2016-11-12: qty 1

## 2016-11-12 MED ORDER — IOPAMIDOL (ISOVUE-300) INJECTION 61%
100.0000 mL | Freq: Once | INTRAVENOUS | Status: AC | PRN
  Administered 2016-11-12: 100 mL via INTRAVENOUS

## 2016-11-12 MED ORDER — KETAMINE HCL 10 MG/ML IJ SOLN
INTRAMUSCULAR | Status: AC | PRN
  Administered 2016-11-12 (×2): 4 mg via INTRAVENOUS

## 2016-11-12 MED ORDER — OXYCODONE-ACETAMINOPHEN 5-325 MG PO TABS
1.0000 | ORAL_TABLET | Freq: Once | ORAL | Status: AC
  Administered 2016-11-12: 1 via ORAL
  Filled 2016-11-12: qty 1

## 2016-11-12 MED ORDER — FENTANYL CITRATE (PF) 100 MCG/2ML IJ SOLN
75.0000 ug | Freq: Once | INTRAMUSCULAR | Status: AC
  Administered 2016-11-12: 17:00:00 via INTRAVENOUS
  Filled 2016-11-12: qty 2

## 2016-11-12 MED ORDER — ONDANSETRON HCL 4 MG/2ML IJ SOLN
4.0000 mg | Freq: Once | INTRAMUSCULAR | Status: AC
  Administered 2016-11-12: 4 mg via INTRAVENOUS
  Filled 2016-11-12: qty 2

## 2016-11-12 MED ORDER — SODIUM CHLORIDE 0.9 % IV BOLUS (SEPSIS)
1000.0000 mL | Freq: Once | INTRAVENOUS | Status: AC
  Administered 2016-11-12: 1000 mL via INTRAVENOUS

## 2016-11-12 MED ORDER — LIDOCAINE HCL (PF) 1 % IJ SOLN
INTRAMUSCULAR | Status: AC
  Filled 2016-11-12: qty 5

## 2016-11-12 MED ORDER — KETOROLAC TROMETHAMINE 30 MG/ML IJ SOLN
10.0000 mg | Freq: Once | INTRAMUSCULAR | Status: AC
  Administered 2016-11-12: 9.9 mg via INTRAVENOUS
  Filled 2016-11-12: qty 1

## 2016-11-12 MED ORDER — LIDOCAINE-EPINEPHRINE 1 %-1:100000 IJ SOLN
10.0000 mL | Freq: Once | INTRAMUSCULAR | Status: AC
  Administered 2016-11-12: 10 mL via INTRADERMAL
  Filled 2016-11-12: qty 10

## 2016-11-12 NOTE — ED Provider Notes (Signed)
[facility] Emergency Department Provider Note  ____________________________________________  Time seen: Approximately 2:01 PM  I have reviewed the triage vital signs and the nursing notes.   HISTORY  Chief Complaint Fall and rib pain    HPI Meghan Myers is a 66 y.o. female that presents to emergency department with left rib pain after falling backwards onto a [employer]. Patient was trying to open a window and when it gave way she lost her balance. Pain is on her left side and is worse with deep breathing. She did not hit her head or lose consciousness. She is not on any blood thinners. No nausea, vomiting, abdominal pain.   Past Medical History:  Diagnosis Date  . Depression   . HLD (hyperlipidemia)   . Hypertension   . Liver disease   . Osteoarthritis   . Rheumatoid arthritis (Early)   . Shingles     Patient Active Problem List   Diagnosis Date Noted  . Leukocytosis 10/10/2014  . Hypokalemia 10/10/2014  . Cat bite of right forearm 10/08/2014  . Cellulitis of arm, right 10/08/2014  . Depression 10/08/2014  . HLD (hyperlipidemia) 10/08/2014  . HTN (hypertension) 10/08/2014    Past Surgical History:  Procedure Laterality Date  . BUNIONECTOMY Bilateral   . WRIST SURGERY      Prior to Admission medications   Medication Sig Start Date End Date Taking? Authorizing Provider  amphetamine-dextroamphetamine (ADDERALL XR) 25 MG 24 hr capsule Take 25 mg by mouth every morning.   Yes [provider]  aspirin 81 MG chewable tablet Chew 81 mg by mouth daily.   Yes [provider]  atorvastatin (LIPITOR) 20 MG tablet Take 20 mg by mouth daily.   Yes [provider]  hydrochlorothiazide (MICROZIDE) 12.5 MG capsule Take 12.5 mg by mouth daily.   Yes [provider]  Omega-3 Fatty Acids (FISH OIL) 1000 MG CAPS Take 1 capsule by mouth daily.   Yes [provider]  sertraline (ZOLOFT) 100 MG tablet Take 100 mg by  mouth daily.   Yes [provider]  HYDROcodone-acetaminophen (NORCO/VICODIN) 5-325 MG per tablet Take 1-2 tablets by mouth every 4 (four) hours as needed for moderate pain. Patient not taking: Reported on 11/12/2016 10/10/14   Theodoro Grist, MD    Allergies Meloxicam and Sulfasalazine  Family History  Problem Relation Age of Onset  . Hypertension Mother   . COPD Mother   . Stroke Mother   . Hypertension Father   . COPD Father   . Heart attack Father     Social History Social History  Substance Use Topics  . Smoking status: Former Smoker    Packs/day: 0.50    Years: 4.00    Types: Cigarettes    Quit date: 10/07/1985  . Smokeless tobacco: Not on file  . Alcohol use 0.0 oz/week     Review of Systems  Gastrointestinal: No abdominal pain.  No nausea, no vomiting.  Musculoskeletal: Positive for rib pain. Skin: Negative for rash, abrasions, lacerations, ecchymosis. Neurological: Negative for headaches   ____________________________________________   PHYSICAL EXAM:  VITAL SIGNS: ED Triage Vitals  Enc Vitals Group     BP 11/12/16 1253 139/75     Pulse Rate 11/12/16 1253 89     Resp 11/12/16 1253 16     Temp 11/12/16 1253 97.9 F (36.6 C)     Temp Source 11/12/16 1253 Oral     SpO2 11/12/16 1253 98 %     Weight  11/12/16 1251 137 lb (62.1 kg)     Height --      Head Circumference --      Peak Flow --      Pain Score 11/12/16 1251 8     Pain Loc --      Pain Edu? --      Excl. in [place]? --      Constitutional: Alert and oriented. Well appearing and in no acute distress. Eyes: Conjunctivae are normal. PERRL. EOMI. Head: Atraumatic. ENT:      Ears:      Nose: No congestion/rhinnorhea.      Mouth/Throat: Mucous membranes are moist.  Neck: No stridor.  Cardiovascular: Normal rate, regular rhythm.  Good peripheral circulation. Respiratory: Normal respiratory effort without tachypnea or retractions. Lungs CTAB.  Gastrointestinal: Bowel sounds 4 quadrants.  Soft and nontender to palpation. Musculoskeletal: Full range of motion to all extremities. No gross deformities appreciated. Neurologic:  Normal speech and language. No gross focal neurologic deficits are appreciated.  Skin:  Skin is warm, dry and intact. No rash noted.   ____________________________________________   LABS (all labs ordered are listed, but only abnormal results are displayed)  Labs Reviewed  CBC WITH DIFFERENTIAL/PLATELET   ____________________________________________  EKG   ____________________________________________  RADIOLOGY Robinette Haines, personally viewed and evaluated these images (plain radiographs) as part of my medical decision making, as well as reviewing the written report by the radiologist.    IMPRESSION:  Left pneumothorax, 10-20%. Atelectasis or infiltrate at the left  base.    Mildly displaced fractures of the left ninth, tenth and eleventh  ribs posterolaterally.     ________________________________________    PROCEDURES  Procedure(s) performed:    Procedures    Medications  lidocaine-EPINEPHrine (XYLOCAINE W/EPI) 1 %-1:100000 (with pres) injection 10 mL (not administered)  ketamine (KETALAR) injection 100 mg (not administered)  oxyCODONE-acetaminophen (PERCOCET/ROXICET) 5-325 MG per tablet 1 tablet (1 tablet Oral Given 11/12/16 1434)  fentaNYL (SUBLIMAZE) injection 75 mcg ( Intravenous Given 11/12/16 1645)  ketorolac (TORADOL) 30 MG/ML injection 9.9 mg (9.9 mg Intravenous Given 11/12/16 1646)  lidocaine (PF) (XYLOCAINE) 1 % injection (  Return to [facility] 11/12/16 1649)  iopamidol (ISOVUE-300) 61 % injection 100 mL (100 mLs Intravenous Contrast Given 11/12/16 1812)     ____________________________________________   INITIAL IMPRESSION / ASSESSMENT AND PLAN / ED COURSE  Pertinent labs & imaging results that were available during my care of the patient were reviewed by me and considered in my medical decision making (see  chart for details).  Review of the [place] CSRS was performed in accordance of the [place] prior to dispensing any controlled drugs.   Patient presented to the emergency department for evaluation of left rib pain and shortness of breath after fall. Chest x-ray consistent with 10-20% pneumothorax and multiple rib fractures. Patient will be transferred to main site ED to Dr. Mable Paris  for evaluation and chest tube placement prior to admission.     ____________________________________________  FINAL CLINICAL IMPRESSION(S) / ED DIAGNOSES  Final diagnoses:  Traumatic pneumothorax, initial encounter  Hemothorax on left  Laceration of spleen, initial encounter  Closed fracture dislocation of lumbar spine, initial encounter ([place])  Contusion of both lungs, initial encounter      NEW MEDICATIONS STARTED DURING THIS VISIT:  New Prescriptions   No medications on file        This chart was dictated using voice recognition software/Dragon. Despite best efforts to proofread, errors can occur which can change the meaning.  Any change was purely unintentional.    Laban Emperor, PA-C 11/12/16 1910    Earleen Newport, MD 11/13/16 1452

## 2016-11-12 NOTE — ED Triage Notes (Addendum)
Pt reports she was cleaning her windows when she fell backward onto her [place] and then to the floor. Pt denies hitting head. Denies use of blood thinners. Reports left sided rib pain.

## 2016-11-12 NOTE — ED Notes (Signed)
I have reviewed and the EMTALA is complete.

## 2016-11-12 NOTE — ED Notes (Signed)
Pt fell backwards today around 1pm off kitchen cabinet onto her counter top and broke ribs on left side. Family at bedside

## 2016-11-12 NOTE — Progress Notes (Signed)
Patient status post fall now with multiple left rib fractures and a left hemopneumothorax. CT scan personal review. There is a grade 1 splenic laceration and one poorly there is multiple upper lumbar fractures. Since we'll have a neurosurgery support  and multiple injuries we will have to asked her to a trauma center. Discussed with the year in detail

## 2016-11-12 NOTE — ED Notes (Signed)
Alvah Gilder (daughter) [phone]

## 2016-11-12 NOTE — ED Provider Notes (Signed)
[facility] Emergency Department Provider Note  ____________________________________________   First MD Initiated Contact with Patient 11/12/16 1520     (approximate)  I have reviewed the triage vital signs and the nursing notes.   HISTORY  Chief Complaint Fall and rib pain    HPI Meghan Myers is a 66 y.o. female who comes to the emergency department with severe sudden onset left-sided pleuritic chest pain that began at 1 PM today when she slipped backwards and fell onto a carpet. She was seen initially in our fast track area where she was diagnosed with a 10-20% pneumothorax along with 3 left-sided rib fractures. Thoracic surgery was consultation recommended a chest tube and inpatient admission. The patient takes no blood thinning medication. She last had food or water this morning with breakfast. She is in a significant amount of pain. She has no abdominal pain nausea or vomiting.   Past Medical History:  Diagnosis Date  . Depression   . HLD (hyperlipidemia)   . Hypertension   . Liver disease   . Osteoarthritis   . Rheumatoid arthritis ([place])   . Shingles     Patient Active Problem List   Diagnosis Date Noted  . Leukocytosis 10/10/2014  . Hypokalemia 10/10/2014  . Cat bite of right forearm 10/08/2014  . Cellulitis of arm, right 10/08/2014  . Depression 10/08/2014  . HLD (hyperlipidemia) 10/08/2014  . HTN (hypertension) 10/08/2014    Past Surgical History:  Procedure Laterality Date  . BUNIONECTOMY Bilateral   . WRIST SURGERY      Prior to Admission medications   Medication Sig Start Date End Date Taking? Authorizing Provider  amphetamine-dextroamphetamine (ADDERALL XR) 25 MG 24 hr capsule Take 25 mg by mouth every morning.   Yes [provider]  aspirin 81 MG chewable tablet Chew 81 mg by mouth daily.   Yes [provider]  atorvastatin (LIPITOR) 20 MG tablet Take 20 mg by mouth daily.   Yes [provider]    hydrochlorothiazide (MICROZIDE) 12.5 MG capsule Take 12.5 mg by mouth daily.   Yes [provider]  Omega-3 Fatty Acids (FISH OIL) 1000 MG CAPS Take 1 capsule by mouth daily.   Yes [provider]  sertraline (ZOLOFT) 100 MG tablet Take 100 mg by mouth daily.   Yes [provider]  HYDROcodone-acetaminophen (NORCO/VICODIN) 5-325 MG per tablet Take 1-2 tablets by mouth every 4 (four) hours as needed for moderate pain. Patient not taking: Reported on 11/12/2016 10/10/14   Theodoro Grist, MD    Allergies Meloxicam and Sulfasalazine  Family History  Problem Relation Age of Onset  . Hypertension Mother   . COPD Mother   . Stroke Mother   . Hypertension Father   . COPD Father   . Heart attack Father     Social History Social History  Substance Use Topics  . Smoking status: Former Smoker    Packs/day: 0.50    Years: 4.00    Types: Cigarettes    Quit date: 10/07/1985  . Smokeless tobacco: Not on file  . Alcohol use 0.0 oz/week    Review of Systems Constitutional: No fever/chills Eyes: No visual changes. ENT: No sore throat. Cardiovascular: Positive chest pain. Respiratory: Positive shortness of breath. Gastrointestinal: No abdominal pain.  No nausea, no vomiting.  No diarrhea.  No constipation. Genitourinary: Negative for dysuria. Musculoskeletal: Negative for back pain. Skin: Negative for rash. Neurological: Negative for headaches, focal weakness or numbness.   ____________________________________________   PHYSICAL  EXAM:  VITAL SIGNS: ED Triage Vitals  Enc Vitals Group     BP 11/12/16 1253 139/75     Pulse Rate 11/12/16 1253 89     Resp 11/12/16 1253 16     Temp 11/12/16 1253 97.9 F (36.6 C)     Temp Source 11/12/16 1253 Oral     SpO2 11/12/16 1253 98 %     Weight 11/12/16 1251 137 lb (62.1 kg)     Height --      Head Circumference --      Peak Flow --      Pain Score 11/12/16 1251 8     Pain Loc --      Pain Edu? --      Excl. in  [place]? --     Constitutional: Alert and oriented 4 appears quite uncomfortable nontoxic no diaphoresis Eyes: PERRL EOMI. Head: Atraumatic. Nose: No congestion/rhinnorhea. Mouth/Throat: No trismus Neck: No stridor.  No midline tenderness Cardiovascular: Normal rate, regular rhythm. Grossly normal heart sounds.  Good peripheral circulation. Exquisite tenderness to left lateral chest wall Respiratory: Normal respiratory effort.  No retractions. Lungs CTAB and moving good air Gastrointestinal: Soft nontender no rebound or guarding no peritonitis Musculoskeletal: No lower extremity edema   Neurologic:  Normal speech and language. No gross focal neurologic deficits are appreciated. Skin:  Skin is warm, dry and intact. No rash noted. Psychiatric: Mood and affect are normal. Speech and behavior are normal.    ____________________________________________   DIFFERENTIAL includes but not limited to  Pneumothorax, tension with eyes, hemothorax, splenic laceration, spinal fracture ____________________________________________   LABS (all labs ordered are listed, but only abnormal results are displayed)  Labs Reviewed  CBC WITH DIFFERENTIAL/PLATELET - Abnormal; Notable for the following:       Result Value   WBC 13.0 (*)    Neutro Abs 11.5 (*)    Lymphs Abs 0.8 (*)    All other components within normal limits  CBC WITH DIFFERENTIAL/PLATELET  BASIC METABOLIC PANEL  HEPATIC FUNCTION PANEL  PROTIME-INR    Blood work unremarkable __________________________________________  EKG   ____________________________________________  RADIOLOGY  CT scan with multiple traumatic injuries including grade 1 splenic laceration, hemopneumothorax, multiple lumbar spinal fractures, pulmonary contusion, and paraspinal hematoma ____________________________________________   PROCEDURES  Procedure(s) performed: yes  Angiocath insertion Performed by: Darel Hong  Consent: Verbal consent  obtained. Risks and benefits: risks, benefits and alternatives were discussed Time out: Immediately prior to procedure a "time out" was called to verify the correct patient, procedure, equipment, support staff and site/side marked as required.  Preparation: Patient was prepped and draped in the usual sterile fashion.  Vein Location: Right antecubital fossa  Ultrasound Guided  Gauge: 18  Normal blood return and flush without difficulty Patient tolerance: Patient tolerated the procedure well with no immediate complications.   CHEST TUBE INSERTION Date/Time: 11/12/2016 at 7:41 PM Performed by: Darel Hong Consent: The procedure was performed in an emergent situation. Imaging studies: imaging studies available Required items: required blood products, implants, devices, and special equipment available Patient identity confirmed: arm band and available demographic data Time out: Immediately prior to procedure a "time out" was called to verify the correct patient, procedure, equipment, support staff and site/side marked as required.  Indications: hemopneumothorax  Patient sedated: yes Anesthesia: yes Preparation: skin prepped with Betadine  Placement location: left  Scalpel size: 10 Tube size: 24 [place] Dissection instrument: finger and Kelly clamp  Tension pneumothorax heard: no Tube connected to: water seal  Suture material: -0 silk  Post-insertion x-ray findings: Good position  Patient tolerance: Patient tolerated the procedure well with no immediate complications   PROCEDURAL SEDATION:  The patient was verbally consented for procedural sedation for chest tube insertion. She received a total of 140 mg of intravenous ketamine during the procedure and never stopped breathing, never desaturated, and had no vital sign abnormalities. She tolerated the procedure well and awoke appropriately.     Procedures  Critical Care performed: no  Observation:  no ____________________________________________   INITIAL IMPRESSION / ASSESSMENT AND PLAN / ED COURSE  Pertinent labs & imaging results that were available during my care of the patient were reviewed by me and considered in my medical decision making (see chart for details).  The patient arrives in my room and appearing extremely uncomfortable. She does have multiple left-sided rib fractures and while she does obviously have a pneumothorax there is some sort of infiltrate at the bases well which I'm concerned could be blood. Also worried that she may have a splenic injury given the location. CT scan chest abdomen pelvis is pending prior chest tube insertion unless the patient decompensates. I'm also placing her on 15 L now.    ----------------------------------------- 6:00 PM on 11/12/2016 -----------------------------------------  The patient's blood at has clotted multiple times in the lab. At this point my clinical suspicion for acute traumatic injury is high so we will CT her without a creatinine.  ____________________________________________ ----------------------------------------- 7:04 PM on 11/12/2016 -----------------------------------------  I was called by radiology that the patient has the following injuries: Grade 1 splenic lack, hemopneumothorax, pulmonary contusion, paraspinal hematoma, and multiple transverse process fractures. I then discussed the case with our general surgeon Dr. Dahlia Byes who recommended transfer to a trauma center.  ----------------------------------------- 7:40 PM on 11/12/2016 -----------------------------------------  I discussed the case with [facility] who accepted the patient as a geriatric red trauma auto accept.  I then sedated the patient with ketamine and placed a 24 french left sided chest tube without complication.  The patient remains stable for transfer.  FINAL CLINICAL IMPRESSION(S) / ED DIAGNOSES  Final diagnoses:  Traumatic  pneumothorax, initial encounter  Hemothorax on left  Laceration of spleen, initial encounter  Closed fracture dislocation of lumbar spine, initial encounter ([place])  Contusion of both lungs, initial encounter  Closed fracture of multiple ribs of left side, initial encounter      NEW MEDICATIONS STARTED DURING THIS VISIT:  New Prescriptions   No medications on file     Note:  This document was prepared using Dragon voice recognition software and may include unintentional dictation errors.     Darel Hong, MD 11/12/16 1943

## 2016-11-14 DIAGNOSIS — M069 Rheumatoid arthritis, unspecified: Secondary | ICD-10-CM | POA: Insufficient documentation

## 2016-11-14 DIAGNOSIS — S2242XA Multiple fractures of ribs, left side, initial encounter for closed fracture: Secondary | ICD-10-CM | POA: Insufficient documentation

## 2016-11-14 DIAGNOSIS — J939 Pneumothorax, unspecified: Secondary | ICD-10-CM | POA: Insufficient documentation

## 2016-11-14 DIAGNOSIS — M1990 Unspecified osteoarthritis, unspecified site: Secondary | ICD-10-CM | POA: Insufficient documentation

## 2017-03-22 DIAGNOSIS — Z85528 Personal history of other malignant neoplasm of kidney: Secondary | ICD-10-CM | POA: Insufficient documentation

## 2017-04-19 ENCOUNTER — Other Ambulatory Visit: Payer: Self-pay | Admitting: Family Medicine

## 2017-04-19 DIAGNOSIS — Z1231 Encounter for screening mammogram for malignant neoplasm of breast: Secondary | ICD-10-CM

## 2017-05-05 ENCOUNTER — Ambulatory Visit
Admission: RE | Admit: 2017-05-05 | Discharge: 2017-05-05 | Disposition: A | Discharge status: Home / Self Care | Payer: Medicare Other | Source: Ambulatory Visit | Attending: Family Medicine | Admitting: Family Medicine

## 2017-05-05 DIAGNOSIS — Z1231 Encounter for screening mammogram for malignant neoplasm of breast: Secondary | ICD-10-CM | POA: Diagnosis not present

## 2017-05-05 HISTORY — DX: Malignant (primary) neoplasm, unspecified: C80.1

## 2017-05-12 ENCOUNTER — Emergency Department
Admission: EM | Admit: 2017-05-12 | Discharge: 2017-05-12 | Disposition: A | Discharge status: Home / Self Care | Payer: Medicare Other | Attending: Emergency Medicine | Admitting: Emergency Medicine

## 2017-05-12 ENCOUNTER — Emergency Department: Payer: Medicare Other

## 2017-05-12 ENCOUNTER — Other Ambulatory Visit: Payer: Self-pay

## 2017-05-12 DIAGNOSIS — Z85528 Personal history of other malignant neoplasm of kidney: Secondary | ICD-10-CM | POA: Insufficient documentation

## 2017-05-12 DIAGNOSIS — M79661 Pain in right lower leg: Secondary | ICD-10-CM | POA: Diagnosis present

## 2017-05-12 DIAGNOSIS — Z79899 Other long term (current) drug therapy: Secondary | ICD-10-CM | POA: Insufficient documentation

## 2017-05-12 DIAGNOSIS — Z7982 Long term (current) use of aspirin: Secondary | ICD-10-CM | POA: Diagnosis not present

## 2017-05-12 DIAGNOSIS — I803 Phlebitis and thrombophlebitis of lower extremities, unspecified: Secondary | ICD-10-CM | POA: Diagnosis not present

## 2017-05-12 DIAGNOSIS — Z87891 Personal history of nicotine dependence: Secondary | ICD-10-CM | POA: Insufficient documentation

## 2017-05-12 DIAGNOSIS — L03115 Cellulitis of right lower limb: Secondary | ICD-10-CM | POA: Insufficient documentation

## 2017-05-12 DIAGNOSIS — I809 Phlebitis and thrombophlebitis of unspecified site: Secondary | ICD-10-CM

## 2017-05-12 DIAGNOSIS — I10 Essential (primary) hypertension: Secondary | ICD-10-CM | POA: Insufficient documentation

## 2017-05-12 MED ORDER — CEPHALEXIN 500 MG PO CAPS: 500.0000 mg | ORAL_CAPSULE | Freq: Three times a day (TID) | ORAL | 0 refills | Status: DC

## 2017-05-12 NOTE — ED Notes (Signed)
Pt states she just noticed the redness on her right lower leg and now some swollen areas that have spread from the original area

## 2017-05-12 NOTE — ED Provider Notes (Signed)
[facility] Emergency Department Provider Note  ____________________________________________   First MD Initiated Contact with Patient 05/12/17 1326     (approximate)  I have reviewed the triage vital signs and the nursing notes.   HISTORY  Chief Complaint Leg Pain    HPI Meghan Myers is a 67 y.o. female presents emergency department with concerns of right lower leg redness and pain.  She is concerned she has a clot.  She had renal cell carcinoma and had a partial nephrectomy in January.  She also had been in the ICU couple months before that for a fall for which she punctured her long break ribs.  She denies any chest pain or shortness of breath at this time.  She is a non-smoker.  No hormone replacement therapy  Past Medical History:  Diagnosis Date  . Cancer ([place]) 10/2016   kidney ca  . Depression   . HLD (hyperlipidemia)   . Hypertension   . Liver disease   . Osteoarthritis   . Rheumatoid arthritis ([place])   . Shingles     Patient Active Problem List   Diagnosis Date Noted  . Leukocytosis 10/10/2014  . Hypokalemia 10/10/2014  . Cat bite of right forearm 10/08/2014  . Cellulitis of arm, right 10/08/2014  . Depression 10/08/2014  . HLD (hyperlipidemia) 10/08/2014  . HTN (hypertension) 10/08/2014    Past Surgical History:  Procedure Laterality Date  . BREAST EXCISIONAL BIOPSY Right 2000?   benign  . BUNIONECTOMY Bilateral   . WRIST SURGERY      Prior to Admission medications   Medication Sig Start Date End Date Taking? Authorizing Provider  amphetamine-dextroamphetamine (ADDERALL XR) 25 MG 24 hr capsule Take 25 mg by mouth every morning.    [provider]  aspirin 81 MG chewable tablet Chew 81 mg by mouth daily.    [provider]  atorvastatin (LIPITOR) 20 MG tablet Take 20 mg by mouth daily.    [provider]  cephALEXin (KEFLEX) 500 MG capsule Take 1 capsule (500 mg total) by mouth 3 (three) times daily.  05/12/17   Fisher, Linden Dolin, PA-C  hydrochlorothiazide (MICROZIDE) 12.5 MG capsule Take 12.5 mg by mouth daily.    [provider]  Omega-3 Fatty Acids (FISH OIL) 1000 MG CAPS Take 1 capsule by mouth daily.    [provider]  sertraline (ZOLOFT) 100 MG tablet Take 100 mg by mouth daily.    [provider]    Allergies Meloxicam and Sulfasalazine  Family History  Problem Relation Age of Onset  . Hypertension Mother   . COPD Mother   . Stroke Mother   . Hypertension Father   . COPD Father   . Heart attack Father     Social History Social History   Tobacco Use  . Smoking status: Former Smoker    Packs/day: 0.50    Years: 4.00    Pack years: 2.00    Types: Cigarettes    Last attempt to quit: 10/07/1985    Years since quitting: 31.6  Substance Use Topics  . Alcohol use: Yes    Alcohol/week: 0.0 oz  . Drug use: No    Review of Systems  Constitutional: No fever/chills Eyes: No visual changes. ENT: No sore throat. Respiratory: Denies cough Genitourinary: Negative for dysuria. Musculoskeletal: Negative for back pain.  Positive for right lower leg redness and swelling Skin: Negative for rash.    ____________________________________________   PHYSICAL EXAM:  VITAL SIGNS: ED Triage  Vitals  Enc Vitals Group     BP 05/12/17 1254 (!) 154/75     Pulse Rate 05/12/17 1254 82     Resp 05/12/17 1254 18     Temp 05/12/17 1254 98.3 F (36.8 C)     Temp Source 05/12/17 1254 Oral     SpO2 05/12/17 1254 100 %     Weight 05/12/17 1255 137 lb (62.1 kg)     Height --      Head Circumference --      Peak Flow --      Pain Score 05/12/17 1255 9     Pain Loc --      Pain Edu? --      Excl. in [place]? --     Constitutional: Alert and oriented. Well appearing and in no acute distress. Eyes: Conjunctivae are normal.  Head: Atraumatic. Nose: No congestion/rhinnorhea. Mouth/Throat: Mucous membranes are moist.   Cardiovascular: Normal rate, regular rhythm.   Heart sounds are normal Respiratory: Normal respiratory effort.  No retractions, lungs clear to auscultation the right lower leg does have a red swollen area along the anterior GU: deferred Musculoskeletal: FROM all extremities, warm and well perfused, going into the ankle.  Negative Homans sign.  Neurovascular is intact Neurologic:  Normal speech and language.  Skin:  Skin is warm, dry and intact.  Positive for redness of the right lower leg. Psychiatric: Mood and affect are normal. Speech and behavior are normal.  ____________________________________________   LABS (all labs ordered are listed, but only abnormal results are displayed)  Labs Reviewed - No data to display ____________________________________________   ____________________________________________  RADIOLOGY  Ultrasound right leg is negative  ____________________________________________   PROCEDURES  Procedure(s) performed: No  Procedures    ____________________________________________   INITIAL IMPRESSION / ASSESSMENT AND PLAN / ED COURSE  Pertinent labs & imaging results that were available during my care of the patient were reviewed by me and considered in my medical decision making (see chart for details).  Patient 67 year old female with concerns of right lower leg swelling and pain.  She is concerned she has a blood clot because she had surgery to remove a renal cell carcinoma in January.  She denies any known injury.  She denies fever or chills.  Symptoms for 2 to 3 days  On physical exam the area is red and warm with some swelling of the ankle.  All of this is anterior.  Explained to the patient that this would not be for a DVT would be.  She still has great concerns of having a DVT.  Therefore ultrasound of the right lower extremity was ordered.    ----------------------------------------- 3:31 PM on 05/12/2017 -----------------------------------------  Ultrasound of the right lower leg is  negative for DVT  Ultrasound results were discussed with patient.  She is given a prescription for Keflex 500 mg for cellulitis.  Explained to her that it would be important to elevate the leg.  She states she understands.  She will comply with our instructions.  She is discharged in stable condition.  She is to follow-up with her regular doctor if not better in 3-5 days.  Return emergency department if worsening  As part of my medical decision making, I reviewed the following data within the [place] notes reviewed and incorporated, Radiograph reviewed ultrasound negative, Notes from prior ED visits and [place] Controlled Substance Database  ____________________________________________   FINAL CLINICAL IMPRESSION(S) / ED DIAGNOSES  Final diagnoses:  Phlebitis  Cellulitis of right lower extremity      NEW MEDICATIONS STARTED DURING THIS VISIT:  Discharge Medication List as of 05/12/2017  2:51 PM    START taking these medications   Details  cephALEXin (KEFLEX) 500 MG capsule Take 1 capsule (500 mg total) by mouth 3 (three) times daily., Starting Thu 05/12/2017, Print         Note:  This document was prepared using Dragon voice recognition software and may include unintentional dictation errors.    Versie Starks, PA-C 05/12/17 1532    Schuyler Amor, MD 05/12/17 1540

## 2017-05-12 NOTE — ED Triage Notes (Addendum)
Right sided lower leg pain, redness to lower half of lower leg. No injury. No pain in calf or knee/behind knee. No injury. Pt alert and oriented X4, active, cooperative, pt in NAD. RR even and unlabored, color WNL.   No pain with dorsiflexion, pain with plantar flexion

## 2017-05-12 NOTE — Discharge Instructions (Signed)
Follow-up with your regular doctor if the pain in your leg continues.  Take the medication as prescribed.  Keep the leg elevated.  Return to emergency department if you are worsening or the pain, redness, or  swelling goes into the posterior part of the leg

## 2017-07-28 ENCOUNTER — Other Ambulatory Visit: Payer: Self-pay | Admitting: Family Medicine

## 2017-07-28 DIAGNOSIS — M5431 Sciatica, right side: Secondary | ICD-10-CM

## 2017-08-08 ENCOUNTER — Other Ambulatory Visit: Payer: Self-pay | Admitting: Family Medicine

## 2017-08-08 DIAGNOSIS — M25511 Pain in right shoulder: Secondary | ICD-10-CM

## 2017-08-08 DIAGNOSIS — F330 Major depressive disorder, recurrent, mild: Secondary | ICD-10-CM | POA: Insufficient documentation

## 2017-08-15 ENCOUNTER — Ambulatory Visit
Admission: RE | Admit: 2017-08-15 | Discharge: 2017-08-15 | Disposition: A | Discharge status: Home / Self Care | Payer: Medicare Other | Source: Ambulatory Visit | Attending: Family Medicine | Admitting: Family Medicine

## 2017-08-15 DIAGNOSIS — M4186 Other forms of scoliosis, lumbar region: Secondary | ICD-10-CM | POA: Insufficient documentation

## 2017-08-15 DIAGNOSIS — M48061 Spinal stenosis, lumbar region without neurogenic claudication: Secondary | ICD-10-CM | POA: Insufficient documentation

## 2017-08-15 DIAGNOSIS — M5136 Other intervertebral disc degeneration, lumbar region: Secondary | ICD-10-CM | POA: Diagnosis not present

## 2017-08-15 DIAGNOSIS — M5431 Sciatica, right side: Secondary | ICD-10-CM | POA: Insufficient documentation

## 2017-08-19 ENCOUNTER — Ambulatory Visit: Admission: RE | Admit: 2017-08-19 | Payer: Medicare Other | Source: Ambulatory Visit

## 2017-08-26 ENCOUNTER — Ambulatory Visit
Admission: RE | Admit: 2017-08-26 | Discharge: 2017-08-26 | Disposition: A | Discharge status: Home / Self Care | Payer: Medicare Other | Source: Ambulatory Visit | Attending: Family Medicine | Admitting: Family Medicine

## 2017-08-26 DIAGNOSIS — M75101 Unspecified rotator cuff tear or rupture of right shoulder, not specified as traumatic: Secondary | ICD-10-CM | POA: Diagnosis not present

## 2017-08-26 DIAGNOSIS — M7551 Bursitis of right shoulder: Secondary | ICD-10-CM | POA: Insufficient documentation

## 2017-08-26 DIAGNOSIS — M25511 Pain in right shoulder: Secondary | ICD-10-CM | POA: Diagnosis not present

## 2017-08-26 DIAGNOSIS — M19011 Primary osteoarthritis, right shoulder: Secondary | ICD-10-CM | POA: Insufficient documentation

## 2017-08-26 DIAGNOSIS — R937 Abnormal findings on diagnostic imaging of other parts of musculoskeletal system: Secondary | ICD-10-CM | POA: Insufficient documentation

## 2018-01-31 ENCOUNTER — Other Ambulatory Visit: Payer: Self-pay

## 2018-01-31 ENCOUNTER — Ambulatory Visit
Admission: RE | Admit: 2018-01-31 | Discharge: 2018-01-31 | Disposition: A | Discharge status: Home / Self Care | Payer: Medicare Other | Source: Ambulatory Visit | Attending: Neurosurgery | Admitting: Neurosurgery

## 2018-01-31 ENCOUNTER — Encounter
Admission: RE | Admit: 2018-01-31 | Discharge: 2018-01-31 | Disposition: A | Discharge status: Home / Self Care | Payer: Medicare Other | Source: Ambulatory Visit | Attending: Neurosurgery | Admitting: Neurosurgery

## 2018-01-31 DIAGNOSIS — Z419 Encounter for procedure for purposes other than remedying health state, unspecified: Secondary | ICD-10-CM | POA: Insufficient documentation

## 2018-01-31 HISTORY — DX: Personal history of urinary calculi: Z87.442

## 2018-01-31 LAB — BASIC METABOLIC PANEL
Anion gap: 7 (ref 5–15)
BUN: 31 mg/dL — ABNORMAL HIGH (ref 8–23)
CHLORIDE: 102 mmol/L (ref 98–111)
CO2: 33 mmol/L — ABNORMAL HIGH (ref 22–32)
Calcium: 10.1 mg/dL (ref 8.9–10.3)
Creatinine, Ser: 0.63 mg/dL (ref 0.44–1.00)
GFR calc Af Amer: >60 mL/min (ref >60)
GFR calc non Af Amer: >60 mL/min (ref >60)
Glucose, Bld: 112 mg/dL — ABNORMAL HIGH (ref 70–99)
Potassium: 4 mmol/L (ref 3.5–5.1)
Sodium: 142 mmol/L (ref 135–145)

## 2018-01-31 LAB — URINALYSIS, ROUTINE W REFLEX MICROSCOPIC
BILIRUBIN URINE: NEGATIVE
Glucose, UA: NEGATIVE mg/dL
Hgb urine dipstick: NEGATIVE
KETONES UR: NEGATIVE mg/dL
Leukocytes, UA: NEGATIVE
NITRITE: NEGATIVE
Protein, ur: NEGATIVE mg/dL
Specific Gravity, Urine: 1.021 (ref 1.005–1.030)
pH: 8 (ref 5.0–8.0)

## 2018-01-31 LAB — CBC
HCT: 42.5 % (ref 36.0–46.0)
Hemoglobin: 13.9 g/dL (ref 12.0–15.0)
MCH: 28.9 pg (ref 26.0–34.0)
MCHC: 32.7 g/dL (ref 30.0–36.0)
MCV: 88.4 fL (ref 80.0–100.0)
Platelets: 307 10*3/uL (ref 150–400)
RBC: 4.81 MIL/uL (ref 3.87–5.11)
RDW: 13.2 % (ref 11.5–15.5)
WBC: 4.2 10*3/uL (ref 4.0–10.5)
nRBC: 0 % (ref 0.0–0.2)

## 2018-01-31 LAB — PROTIME-INR
INR: 0.91
PROTHROMBIN TIME: 12.2 s (ref 11.4–15.2)

## 2018-01-31 LAB — SURGICAL PCR SCREEN
MRSA, PCR: NEGATIVE
Staphylococcus aureus: NEGATIVE

## 2018-01-31 LAB — APTT: aPTT: 30 seconds (ref 24–36)

## 2018-01-31 NOTE — Pre-Procedure Instructions (Signed)
Faxed abnormal labs (BUN 31) to Dr. Lacinda Axon. Fax confirmation received.

## 2018-01-31 NOTE — Patient Instructions (Signed)
Your procedure is scheduled on: Monday 02/06/18.  Report to DAY SURGERY DEPARTMENT LOCATED ON 2ND FLOOR MEDICAL MALL ENTRANCE. To find out your arrival time please call [phone] between 1PM - 3PM on Friday 02/03/18.    Remember: Instructions that are not followed completely may result in serious medical risk, up to and including death, or upon the discretion of your surgeon and anesthesiologist your surgery may need to be rescheduled.     _X__ 1. Do not eat food after midnight the night before your procedure.                 No gum chewing or hard candies. You may drink clear liquids up to 2 hours                 before you are scheduled to arrive for your surgery- DO NOT drink clear                 liquids within 2 hours of the start of your surgery.                 Clear Liquids include:  water, apple juice without pulp, clear carbohydrate                 drink such as Clearfast or Gatorade, Black Coffee or Tea (Do not add                 anything to coffee or tea).  __X__2.  On the morning of surgery brush your teeth with toothpaste and water, you may rinse your mouth with mouthwash if you wish.  Do not swallow any toothpaste or mouthwash.     _X__ 3.  No Alcohol for 24 hours before or after surgery.  __X__4.  Notify your doctor if there is any change in your medical condition      (cold, fever, infections).     Do not wear jewelry, make-up, hairpins, clips or nail polish. Do not wear lotions, powders, or perfumes.  Do not shave 48 hours prior to surgery. Men may shave face and neck. Do not bring valuables to the hospital.    [facility] is not responsible for any belongings or valuables.   Contacts, dentures/partials or body piercings may not be worn into surgery. Bring a case for your contacts, glasses or hearing aids, a denture cup will be supplied.   Leave your suitcase in the car. After surgery it may be brought to your room.   For patients admitted to the hospital,  discharge time is determined by your treatment team.    Patients discharged the day of surgery will not be allowed to drive home.   Please read over the following fact sheets that you were given:   MRSA Information  __X__ Take these medicines the morning of surgery with A SIP OF WATER:     1. acetaminophen (TYLENOL) 650 MG CR tablet  2. amphetamine-dextroamphetamine (ADDERALL XR) 25 MG 24 hr capsule  3. sertraline (ZOLOFT) 100 MG tablet     __X__ Use CHG Soap as directed   __X__ Stop Anti-inflammatories 7 days before surgery such as Advil, Ibuprofen, Motrin, BC or Goodies Powder, Naprosyn, Naproxen, Aleve, Aspirin, Excedrin Meloxicam. May take Tylenol if needed for pain or discomfort.   __X__ Stop the following herbal supplements today:  Omega-3 Fatty Acids (FISH OIL) 1000 MG CAPS Hyaluronic Acid 20-60 MG CAPS

## 2018-02-06 ENCOUNTER — Encounter: Payer: Self-pay | Admitting: *Deleted

## 2018-02-06 ENCOUNTER — Encounter
Admission: RE | Disposition: A | Discharge status: Home / Self Care | Payer: Self-pay | Source: Ambulatory Visit | Attending: Neurosurgery

## 2018-02-06 ENCOUNTER — Observation Stay
Admission: RE | Admit: 2018-02-06 | Discharge: 2018-02-07 | Disposition: A | Discharge status: Home / Self Care | Payer: Medicare Other | Source: Ambulatory Visit | Attending: Neurosurgery | Admitting: Neurosurgery

## 2018-02-06 ENCOUNTER — Ambulatory Visit: Payer: Medicare Other

## 2018-02-06 ENCOUNTER — Ambulatory Visit: Payer: Medicare Other | Admitting: Anesthesiology

## 2018-02-06 ENCOUNTER — Other Ambulatory Visit: Payer: Self-pay

## 2018-02-06 DIAGNOSIS — G9782 Other postprocedural complications and disorders of nervous system: Secondary | ICD-10-CM | POA: Diagnosis not present

## 2018-02-06 DIAGNOSIS — Z79899 Other long term (current) drug therapy: Secondary | ICD-10-CM | POA: Insufficient documentation

## 2018-02-06 DIAGNOSIS — G960 Cerebrospinal fluid leak: Secondary | ICD-10-CM | POA: Insufficient documentation

## 2018-02-06 DIAGNOSIS — M48061 Spinal stenosis, lumbar region without neurogenic claudication: Principal | ICD-10-CM | POA: Insufficient documentation

## 2018-02-06 DIAGNOSIS — E785 Hyperlipidemia, unspecified: Secondary | ICD-10-CM | POA: Insufficient documentation

## 2018-02-06 DIAGNOSIS — Z905 Acquired absence of kidney: Secondary | ICD-10-CM | POA: Diagnosis not present

## 2018-02-06 DIAGNOSIS — Z7982 Long term (current) use of aspirin: Secondary | ICD-10-CM | POA: Insufficient documentation

## 2018-02-06 DIAGNOSIS — I10 Essential (primary) hypertension: Secondary | ICD-10-CM | POA: Diagnosis not present

## 2018-02-06 DIAGNOSIS — Y838 Other surgical procedures as the cause of abnormal reaction of the patient, or of later complication, without mention of misadventure at the time of the procedure: Secondary | ICD-10-CM | POA: Diagnosis not present

## 2018-02-06 DIAGNOSIS — Z87891 Personal history of nicotine dependence: Secondary | ICD-10-CM | POA: Diagnosis not present

## 2018-02-06 DIAGNOSIS — F329 Major depressive disorder, single episode, unspecified: Secondary | ICD-10-CM | POA: Diagnosis not present

## 2018-02-06 DIAGNOSIS — M069 Rheumatoid arthritis, unspecified: Secondary | ICD-10-CM | POA: Insufficient documentation

## 2018-02-06 DIAGNOSIS — M5117 Intervertebral disc disorders with radiculopathy, lumbosacral region: Secondary | ICD-10-CM | POA: Diagnosis not present

## 2018-02-06 DIAGNOSIS — Z419 Encounter for procedure for purposes other than remedying health state, unspecified: Secondary | ICD-10-CM

## 2018-02-06 DIAGNOSIS — Z85528 Personal history of other malignant neoplasm of kidney: Secondary | ICD-10-CM | POA: Diagnosis not present

## 2018-02-06 DIAGNOSIS — M5416 Radiculopathy, lumbar region: Secondary | ICD-10-CM | POA: Diagnosis present

## 2018-02-06 HISTORY — PX: LUMBAR LAMINECTOMY/DECOMPRESSION MICRODISCECTOMY: SHX5026

## 2018-02-06 SURGERY — LUMBAR LAMINECTOMY/DECOMPRESSION MICRODISCECTOMY 1 LEVEL
Anesthesia: General | Laterality: Right

## 2018-02-06 MED ORDER — PHENOL 1.4 % MT LIQD: 1.0000 | OROMUCOSAL | Status: DC | PRN

## 2018-02-06 MED ORDER — ONDANSETRON HCL 4 MG/2ML IJ SOLN
INTRAMUSCULAR | Status: AC
  Filled 2018-02-06: qty 2

## 2018-02-06 MED ORDER — SERTRALINE HCL 50 MG PO TABS
100.0000 mg | ORAL_TABLET | Freq: Every day | ORAL | Status: DC
  Administered 2018-02-07: 100 mg via ORAL
  Filled 2018-02-06: qty 2

## 2018-02-06 MED ORDER — BUPIVACAINE HCL 0.5 % IJ SOLN
INTRAMUSCULAR | Status: DC | PRN
  Administered 2018-02-06: 3 mL

## 2018-02-06 MED ORDER — SUCCINYLCHOLINE CHLORIDE 20 MG/ML IJ SOLN
INTRAMUSCULAR | Status: AC
  Filled 2018-02-06: qty 1

## 2018-02-06 MED ORDER — FAMOTIDINE 20 MG PO TABS
ORAL_TABLET | ORAL | Status: AC
  Administered 2018-02-06: 20 mg via ORAL
  Filled 2018-02-06: qty 1

## 2018-02-06 MED ORDER — SODIUM CHLORIDE FLUSH 0.9 % IV SOLN
INTRAVENOUS | Status: AC
  Filled 2018-02-06: qty 10

## 2018-02-06 MED ORDER — LIDOCAINE-EPINEPHRINE 1 %-1:100000 IJ SOLN
INTRAMUSCULAR | Status: AC
  Filled 2018-02-06: qty 1

## 2018-02-06 MED ORDER — SODIUM CHLORIDE 0.9 % IV SOLN
INTRAVENOUS | Status: DC
  Administered 2018-02-06 – 2018-02-07 (×2): via INTRAVENOUS

## 2018-02-06 MED ORDER — ONDANSETRON HCL 4 MG/2ML IJ SOLN: 4.0000 mg | Freq: Once | INTRAMUSCULAR | Status: DC | PRN

## 2018-02-06 MED ORDER — HYDROMORPHONE HCL 1 MG/ML IJ SOLN: 0.5000 mg | INTRAMUSCULAR | Status: DC | PRN

## 2018-02-06 MED ORDER — PROPOFOL 10 MG/ML IV BOLUS
INTRAVENOUS | Status: DC | PRN
  Administered 2018-02-06: 110 mg via INTRAVENOUS

## 2018-02-06 MED ORDER — METHOCARBAMOL 1000 MG/10ML IJ SOLN
500.0000 mg | Freq: Four times a day (QID) | INTRAVENOUS | Status: DC | PRN
  Filled 2018-02-06: qty 5

## 2018-02-06 MED ORDER — CEFAZOLIN SODIUM-DEXTROSE 1-4 GM/50ML-% IV SOLN
INTRAVENOUS | Status: AC
  Filled 2018-02-06: qty 50

## 2018-02-06 MED ORDER — FAMOTIDINE 20 MG PO TABS
20.0000 mg | ORAL_TABLET | Freq: Once | ORAL | Status: AC
  Administered 2018-02-06: 20 mg via ORAL

## 2018-02-06 MED ORDER — ONDANSETRON HCL 4 MG/2ML IJ SOLN
INTRAMUSCULAR | Status: DC | PRN
  Administered 2018-02-06: 4 mg via INTRAVENOUS

## 2018-02-06 MED ORDER — HYDROCHLOROTHIAZIDE 12.5 MG PO CAPS: 12.5000 mg | ORAL_CAPSULE | Freq: Every day | ORAL | Status: DC

## 2018-02-06 MED ORDER — CEFAZOLIN SODIUM-DEXTROSE 1-4 GM/50ML-% IV SOLN
1.0000 g | Freq: Once | INTRAVENOUS | Status: AC
  Administered 2018-02-06: 1 g via INTRAVENOUS

## 2018-02-06 MED ORDER — ATORVASTATIN CALCIUM 20 MG PO TABS
20.0000 mg | ORAL_TABLET | Freq: Every day | ORAL | Status: DC
  Administered 2018-02-06 – 2018-02-07 (×2): 20 mg via ORAL
  Filled 2018-02-06 (×2): qty 1

## 2018-02-06 MED ORDER — SODIUM CHLORIDE 0.9 % IV SOLN: 250.0000 mL | INTRAVENOUS | Status: DC

## 2018-02-06 MED ORDER — SODIUM CHLORIDE 0.9% FLUSH: 3.0000 mL | Freq: Two times a day (BID) | INTRAVENOUS | Status: DC

## 2018-02-06 MED ORDER — ACETAMINOPHEN 325 MG PO TABS: 650.0000 mg | ORAL_TABLET | ORAL | Status: DC | PRN

## 2018-02-06 MED ORDER — ACETAMINOPHEN 500 MG PO TABS
1000.0000 mg | ORAL_TABLET | Freq: Four times a day (QID) | ORAL | Status: AC
  Administered 2018-02-06 – 2018-02-07 (×4): 1000 mg via ORAL
  Filled 2018-02-06 (×4): qty 2

## 2018-02-06 MED ORDER — LACTATED RINGERS IV SOLN
INTRAVENOUS | Status: DC
  Administered 2018-02-06: 07:00:00 via INTRAVENOUS

## 2018-02-06 MED ORDER — LIDOCAINE HCL (PF) 2 % IJ SOLN
INTRAMUSCULAR | Status: AC
  Filled 2018-02-06: qty 10

## 2018-02-06 MED ORDER — BUPIVACAINE HCL (PF) 0.5 % IJ SOLN
INTRAMUSCULAR | Status: AC
  Filled 2018-02-06: qty 30

## 2018-02-06 MED ORDER — PHENYLEPHRINE HCL 10 MG/ML IJ SOLN
INTRAMUSCULAR | Status: DC | PRN
  Administered 2018-02-06 (×2): 50 ug via INTRAVENOUS

## 2018-02-06 MED ORDER — THROMBIN 5000 UNITS EX SOLR
CUTANEOUS | Status: DC | PRN
  Administered 2018-02-06: 5000 [IU] via TOPICAL

## 2018-02-06 MED ORDER — FENTANYL CITRATE (PF) 250 MCG/5ML IJ SOLN
INTRAMUSCULAR | Status: AC
  Filled 2018-02-06: qty 5

## 2018-02-06 MED ORDER — DEXAMETHASONE SODIUM PHOSPHATE 10 MG/ML IJ SOLN
INTRAMUSCULAR | Status: DC | PRN
  Administered 2018-02-06: 8 mg via INTRAVENOUS

## 2018-02-06 MED ORDER — THROMBIN 5000 UNITS EX SOLR
CUTANEOUS | Status: AC
  Filled 2018-02-06: qty 5000

## 2018-02-06 MED ORDER — ACETAMINOPHEN 650 MG RE SUPP: 650.0000 mg | RECTAL | Status: DC | PRN

## 2018-02-06 MED ORDER — SUGAMMADEX SODIUM 200 MG/2ML IV SOLN
INTRAVENOUS | Status: AC
  Filled 2018-02-06: qty 2

## 2018-02-06 MED ORDER — BACITRACIN 50000 UNITS IM SOLR
INTRAMUSCULAR | Status: AC
  Filled 2018-02-06: qty 1

## 2018-02-06 MED ORDER — METHOCARBAMOL 500 MG PO TABS
500.0000 mg | ORAL_TABLET | Freq: Four times a day (QID) | ORAL | Status: DC | PRN
  Administered 2018-02-06: 500 mg via ORAL
  Filled 2018-02-06: qty 1

## 2018-02-06 MED ORDER — ROCURONIUM BROMIDE 100 MG/10ML IV SOLN
INTRAVENOUS | Status: DC | PRN
  Administered 2018-02-06: 5 mg via INTRAVENOUS
  Administered 2018-02-06: 20 mg via INTRAVENOUS

## 2018-02-06 MED ORDER — SUCCINYLCHOLINE CHLORIDE 20 MG/ML IJ SOLN
INTRAMUSCULAR | Status: DC | PRN
  Administered 2018-02-06: 60 mg via INTRAVENOUS

## 2018-02-06 MED ORDER — ONDANSETRON HCL 4 MG/2ML IJ SOLN: 4.0000 mg | Freq: Four times a day (QID) | INTRAMUSCULAR | Status: DC | PRN

## 2018-02-06 MED ORDER — FENTANYL CITRATE (PF) 100 MCG/2ML IJ SOLN
INTRAMUSCULAR | Status: DC | PRN
  Administered 2018-02-06: 25 ug via INTRAVENOUS
  Administered 2018-02-06: 100 ug via INTRAVENOUS
  Administered 2018-02-06: 25 ug via INTRAVENOUS

## 2018-02-06 MED ORDER — METHYLPREDNISOLONE ACETATE 40 MG/ML IJ SUSP
INTRAMUSCULAR | Status: AC
  Filled 2018-02-06: qty 1

## 2018-02-06 MED ORDER — SODIUM CHLORIDE 0.9% FLUSH
INTRAVENOUS | Status: DC | PRN
  Administered 2018-02-06: 10 mL

## 2018-02-06 MED ORDER — PROPOFOL 10 MG/ML IV BOLUS
INTRAVENOUS | Status: AC
  Filled 2018-02-06: qty 20

## 2018-02-06 MED ORDER — ONDANSETRON HCL 4 MG PO TABS: 4.0000 mg | ORAL_TABLET | Freq: Four times a day (QID) | ORAL | Status: DC | PRN

## 2018-02-06 MED ORDER — SODIUM CHLORIDE 0.9 % IR SOLN
Status: DC | PRN
  Administered 2018-02-06: 1000 mL

## 2018-02-06 MED ORDER — LIDOCAINE-EPINEPHRINE (PF) 1 %-1:200000 IJ SOLN
INTRAMUSCULAR | Status: DC | PRN
  Administered 2018-02-06: 3 mL

## 2018-02-06 MED ORDER — AMPHETAMINE-DEXTROAMPHET ER 5 MG PO CP24
25.0000 mg | ORAL_CAPSULE | ORAL | Status: DC
  Administered 2018-02-07: 25 mg via ORAL
  Filled 2018-02-06: qty 5

## 2018-02-06 MED ORDER — LIDOCAINE HCL (CARDIAC) PF 100 MG/5ML IV SOSY
PREFILLED_SYRINGE | INTRAVENOUS | Status: DC | PRN
  Administered 2018-02-06: 50 mg via INTRAVENOUS

## 2018-02-06 MED ORDER — ROCURONIUM BROMIDE 50 MG/5ML IV SOLN
INTRAVENOUS | Status: AC
  Filled 2018-02-06: qty 1

## 2018-02-06 MED ORDER — OXYCODONE HCL 5 MG PO TABS
10.0000 mg | ORAL_TABLET | ORAL | Status: DC | PRN
  Filled 2018-02-06: qty 2

## 2018-02-06 MED ORDER — OXYCODONE HCL 5 MG PO TABS
5.0000 mg | ORAL_TABLET | ORAL | Status: DC | PRN
  Administered 2018-02-06 (×3): 5 mg via ORAL
  Filled 2018-02-06 (×2): qty 1

## 2018-02-06 MED ORDER — PHENYLEPHRINE HCL 10 MG/ML IJ SOLN
INTRAMUSCULAR | Status: AC
  Filled 2018-02-06: qty 1

## 2018-02-06 MED ORDER — SENNOSIDES-DOCUSATE SODIUM 8.6-50 MG PO TABS: 1.0000 | ORAL_TABLET | Freq: Every evening | ORAL | Status: DC | PRN

## 2018-02-06 MED ORDER — FENTANYL CITRATE (PF) 100 MCG/2ML IJ SOLN: 25.0000 ug | INTRAMUSCULAR | Status: DC | PRN

## 2018-02-06 MED ORDER — LIDOCAINE HCL 4 % MT SOLN
OROMUCOSAL | Status: DC | PRN
  Administered 2018-02-06: 2 mL via TOPICAL

## 2018-02-06 MED ORDER — MENTHOL 3 MG MT LOZG: 1.0000 | LOZENGE | OROMUCOSAL | Status: DC | PRN

## 2018-02-06 MED ORDER — SODIUM CHLORIDE 0.9 % IV SOLN
INTRAVENOUS | Status: DC | PRN
  Administered 2018-02-06: 10 ug/min via INTRAVENOUS

## 2018-02-06 MED ORDER — SODIUM CHLORIDE 0.9% FLUSH: 3.0000 mL | INTRAVENOUS | Status: DC | PRN

## 2018-02-06 SURGICAL SUPPLY — 63 items
BUR NEURO DRILL SOFT 3.0X3.8M (BURR) ×3 IMPLANT
CANISTER SUCT 1200ML W/VALVE (MISCELLANEOUS) ×6 IMPLANT
CHLORAPREP W/TINT 26ML (MISCELLANEOUS) ×6 IMPLANT
COUNTER NEEDLE 20/40 LG (NEEDLE) ×3 IMPLANT
COVER LIGHT HANDLE STERIS (MISCELLANEOUS) ×6 IMPLANT
COVER WAND RF STERILE (DRAPES) ×3 IMPLANT
CUP MEDICINE 2OZ PLAST GRAD ST (MISCELLANEOUS) ×3 IMPLANT
DERMABOND ADVANCED (GAUZE/BANDAGES/DRESSINGS) ×2
DERMABOND ADVANCED .7 DNX12 (GAUZE/BANDAGES/DRESSINGS) ×1 IMPLANT
DRAPE C-ARM 42X72 X-RAY (DRAPES) ×6 IMPLANT
DRAPE LAPAROTOMY 100X77 ABD (DRAPES) ×3 IMPLANT
DRAPE MICROSCOPE SPINE 48X150 (DRAPES) IMPLANT
DRAPE SURG 17X11 SM STRL (DRAPES) ×3 IMPLANT
DRSG TEGADERM 2-3/8X2-3/4 SM (GAUZE/BANDAGES/DRESSINGS) ×3 IMPLANT
DRSG TELFA 4X3 1S NADH ST (GAUZE/BANDAGES/DRESSINGS) ×3 IMPLANT
DURASEAL APPLICATOR TIP (TIP) ×3 IMPLANT
DURASEAL SPINE SEALANT 3ML (MISCELLANEOUS) ×3 IMPLANT
ELECT CAUTERY BLADE TIP 2.5 (TIP) ×3
ELECT EZSTD 165MM 6.5IN (MISCELLANEOUS) ×3
ELECT REM PT RETURN 9FT ADLT (ELECTROSURGICAL) ×3
ELECTRODE CAUTERY BLDE TIP 2.5 (TIP) ×1 IMPLANT
ELECTRODE EZSTD 165MM 6.5IN (MISCELLANEOUS) ×1 IMPLANT
ELECTRODE REM PT RTRN 9FT ADLT (ELECTROSURGICAL) ×1 IMPLANT
GAUZE SPONGE 4X4 12PLY STRL (GAUZE/BANDAGES/DRESSINGS) ×3 IMPLANT
GLOVE BIOGEL PI IND STRL 7.0 (GLOVE) ×1 IMPLANT
GLOVE BIOGEL PI IND STRL 8 (GLOVE) ×1 IMPLANT
GLOVE BIOGEL PI INDICATOR 7.0 (GLOVE) ×2
GLOVE BIOGEL PI INDICATOR 8 (GLOVE) ×2
GLOVE SURG SYN 6.5 ES PF (GLOVE) ×6 IMPLANT
GLOVE SURG SYN 8.0 (GLOVE) ×9 IMPLANT
GOWN STRL REUS W/ TWL XL LVL3 (GOWN DISPOSABLE) ×1 IMPLANT
GOWN STRL REUS W/TWL MED LVL3 (GOWN DISPOSABLE) ×3 IMPLANT
GOWN STRL REUS W/TWL XL LVL3 (GOWN DISPOSABLE) ×2
GRADUATE 1200CC STRL 31836 (MISCELLANEOUS) ×3 IMPLANT
GRAFT DURAGEN MATRIX 1WX1L (Tissue) ×3 IMPLANT
KIT TURNOVER KIT A (KITS) ×3 IMPLANT
KIT WILSON FRAME (KITS) ×3 IMPLANT
KNIFE BAYONET SHORT DISCETOMY (MISCELLANEOUS) IMPLANT
MARKER SKIN DUAL TIP RULER LAB (MISCELLANEOUS) ×6 IMPLANT
NDL SAFETY ECLIPSE 18X1.5 (NEEDLE) ×1 IMPLANT
NEEDLE HYPO 18GX1.5 SHARP (NEEDLE) ×2
NEEDLE HYPO 22GX1.5 SAFETY (NEEDLE) ×3 IMPLANT
NS IRRIG 1000ML POUR BTL (IV SOLUTION) ×3 IMPLANT
PACK LAMINECTOMY NEURO (CUSTOM PROCEDURE TRAY) ×3 IMPLANT
PAD ARMBOARD 7.5X6 YLW CONV (MISCELLANEOUS) ×3 IMPLANT
SPOGE SURGIFLO 8M (HEMOSTASIS) ×2
SPONGE SURGIFLO 8M (HEMOSTASIS) ×1 IMPLANT
STAPLER SKIN PROX 35W (STAPLE) IMPLANT
SUT ETHILON 4-0 (SUTURE) ×2
SUT ETHILON 4-0 FS2 18XMFL BLK (SUTURE) ×1
SUT NURALON 4 0 TR CR/8 (SUTURE) IMPLANT
SUT POLYSORB 2-0 5X18 GS-10 (SUTURE) ×3 IMPLANT
SUT VIC AB 0 CT1 18XCR BRD 8 (SUTURE) ×1 IMPLANT
SUT VIC AB 0 CT1 8-18 (SUTURE) ×2
SUTURE ETHLN 4-0 FS2 18XMF BLK (SUTURE) ×1 IMPLANT
SYR 10ML LL (SYRINGE) ×6 IMPLANT
SYR 30ML LL (SYRINGE) ×3 IMPLANT
SYR 3ML LL SCALE MARK (SYRINGE) ×3 IMPLANT
TOWEL OR 17X26 4PK STRL BLUE (TOWEL DISPOSABLE) ×6 IMPLANT
TUBE MATRX SPINL 26MM 5CM DISP (INSTRUMENTS) ×2
TUBE METRX SPINAL 26X5 DISP (INSTRUMENTS) ×1 IMPLANT
TUBING CONNECTING 10 (TUBING) ×2 IMPLANT
TUBING CONNECTING 10' (TUBING) ×1

## 2018-02-06 NOTE — Anesthesia Postprocedure Evaluation (Signed)
Anesthesia Post Note  Patient: Meghan Myers  Procedure(s) Performed: LUMBAR HEMI-LAMINECTOMY AND FORAMINOTOMYWITH DISCECTOMY 1 LEVEL L4-5 (Right )  Patient location during evaluation: PACU Anesthesia Type: General Level of consciousness: awake and alert Pain management: pain level controlled Vital Signs Assessment: post-procedure vital signs reviewed and stable Respiratory status: spontaneous breathing and respiratory function stable Cardiovascular status: stable Anesthetic complications: no     Last Vitals:  Vitals:   02/06/18 1006 02/06/18 1019  BP: (!) 133/47 136/64  Pulse: 85 80  Resp: 19 10  Temp:    SpO2: 98% 95%    Last Pain:  Vitals:   02/06/18 1019  TempSrc:   PainSc: 0-No pain                 KEPHART,WILLIAM K

## 2018-02-06 NOTE — Discharge Instructions (Signed)
. Your surgeon has performed an operation on your lumbar spine (low back) to relieve pressure on one or more nerves. Many times, patients feel better immediately after surgery and can overdo it. Even if you feel well, it is important that you follow these activity guidelines. If you do not let your back heal properly from the surgery, you can increase the chance of a disc herniation and/or return of your symptoms. The following are instructions to help in your recovery once you have been discharged from the hospital.  * Do not take anti-inflammatory medications for 3 days after surgery (naproxen [Aleve], ibuprofen [Advil, Motrin], celecoxib [Celebrex], etc.) * do not take aspirin or Omega 3 supplement for 7 days  Activity    No bending, lifting, or twisting (BLT). Avoid lifting objects heavier than 10 pounds (gallon milk jug).  Where possible, avoid household activities that involve lifting, bending, pushing, or pulling such as laundry, vacuuming, grocery shopping, and childcare. Try to arrange for help from friends and family for these activities while your back heals.  Increase physical activity slowly as tolerated.  Taking short walks is encouraged, but avoid strenuous exercise. Do not jog, run, bicycle, lift weights, or participate in any other exercises unless specifically allowed by your doctor. Avoid prolonged sitting, including car rides.  Talk to your doctor before resuming sexual activity.  You should not drive until cleared by your doctor.  Until released by your doctor, you should not return to work or school.  You should rest at home and let your body heal.   You may shower two days after your surgery.  After showering, lightly dab your incision dry. Do not take a tub bath or go swimming for 3 weeks, or until approved by your doctor at your follow-up appointment.  If you smoke, we strongly recommend that you quit.  Smoking has been proven to interfere with normal healing in your  back and will dramatically reduce the success rate of your surgery. Please contact [facility] (800-QUIT-NOW) and use the resources at www.[facility].com for assistance in stopping smoking.  Surgical Incision   If you have a dressing on your incision, you may remove it three days after your surgery. Keep your incision area clean and dry.  If you have staples or stitches on your incision, you should have a follow up scheduled for removal. If you do not have staples or stitches, you will have steri-strips (small pieces of surgical tape) or Dermabond glue. The steri-strips/glue should begin to peel away within about a week (it is fine if the steri-strips fall off before then). If the strips are still in place one week after your surgery, you may gently remove them.  Diet            You may return to your usual diet. Be sure to stay hydrated.  When to Contact us  Although your surgery and recovery will likely be uneventful, you may have some residual numbness, aches, and pains in your back and/or legs. This is normal and should improve in the next few weeks.  However, should you experience any of the following, contact us immediately:  New numbness or weakness  Pain that is progressively getting worse, and is not relieved by your pain medications or rest  Bleeding, redness, swelling, pain, or drainage from surgical incision  Chills or flu-like symptoms  Fever greater than 101.0 F (38.3 C)  Problems with bowel or bladder functions  Difficulty breathing or shortness of breath  Warmth, tenderness,  or swelling in your calf  Contact Information  During office hours (Monday-Friday 9 am to 5 pm), please call your physician at [phone]  After hours and weekends, please call the [facility] Operator at [phone] and ask for the Neurosurgery Resident On Call   For a life-threatening emergency, call 911

## 2018-02-06 NOTE — Transfer of Care (Signed)
Immediate Anesthesia Transfer of Care Note  Patient: Meghan Myers  Procedure(s) Performed: LUMBAR HEMI-LAMINECTOMY AND FORAMINOTOMYWITH DISCECTOMY 1 LEVEL L4-5 (Right )  Patient Location: PACU  Anesthesia Type:General  Level of Consciousness: awake and responds to stimulation  Airway & Oxygen Therapy: Patient Spontanous Breathing and Patient connected to face mask oxygen  Post-op Assessment: Report given to RN and Post -op Vital signs reviewed and stable  Post vital signs: Reviewed and stable  Last Vitals:  Vitals Value Taken Time  BP 139/69 02/06/2018  9:49 AM  Temp    Pulse 84 02/06/2018  9:49 AM  Resp 11 02/06/2018  9:49 AM  SpO2 100 % 02/06/2018  9:49 AM    Last Pain:  Vitals:   02/06/18 0613  TempSrc: Oral  PainSc: 3       Patients Stated Pain Goal: 2 (34/03/52 4818)  Complications: No apparent anesthesia complications

## 2018-02-06 NOTE — H&P (Signed)
Meghan Myers is an 67 y.o. female.   Chief Complaint: Back and right leg pain   HPI:Ms. Meghan Myers is here with approximate 6 months of ongoing back and right leg pain she does feel the right hip pain in the leg is worse than the back. She does not endorse any left leg pain. The pain will travel down the lateral side of the right thigh into the foot into the plantar surface. There is no associated numbness or weakness. The pain is worse with sitting but also exacerbates with walking. She does feel that if she walks for prolonged period of time that it was slightly improved. She did recently undergo a series of epidural steroid injections which did give her approximately a week of relief. She has been to physical therapy before. She has not tried Restaurant manager, fast food or acupuncture. She has never had any other surgery on her back.   Past Medical History:  Diagnosis Date  . Cancer ([place]) 10/2016   kidney ca  . Depression   . History of kidney stones   . HLD (hyperlipidemia)   . Hypertension   . Liver disease   . Osteoarthritis   . Rheumatoid arthritis ([place])   . Shingles     Past Surgical History:  Procedure Laterality Date  . BREAST EXCISIONAL BIOPSY Right 2000?   benign  . BUNIONECTOMY Bilateral   . FACIAL FRACTURE SURGERY    . FACIAL LACERATION REPAIR    . FOOT SURGERY    . KIDNEY SURGERY    . TONSILLECTOMY    . WRIST SURGERY      Family History  Problem Relation Age of Onset  . Hypertension Mother   . COPD Mother   . Stroke Mother   . Hypertension Father   . COPD Father   . Heart attack Father    Social History:  reports that she quit smoking about 32 years ago. Her smoking use included cigarettes. She has a 2.00 pack-year smoking history. She has never used smokeless tobacco. She reports that she drinks alcohol. She reports that she does not use drugs.  Allergies:  Allergies  Allergen Reactions  . Meloxicam Other (See Comments)    "disturbs my liver"   . Sulfasalazine Nausea Only     Medications Prior to Admission  Medication Sig Dispense Refill  . acetaminophen (TYLENOL) 650 MG CR tablet Take 1,300 mg by mouth every 8 (eight) hours as needed for pain.    Marland Kitchen amphetamine-dextroamphetamine (ADDERALL XR) 25 MG 24 hr capsule Take 25 mg by mouth every morning.    Marland Kitchen aspirin 81 MG tablet Take 81 mg by mouth daily.     Marland Kitchen atorvastatin (LIPITOR) 20 MG tablet Take 20 mg by mouth daily.    . hydrochlorothiazide (MICROZIDE) 12.5 MG capsule Take 12.5 mg by mouth daily.    . Multiple Minerals-Vitamins (CALCIUM-MAGNESIUM-ZINC-D3) TABS Take 1 tablet by mouth daily.    . Multiple Vitamin (MULTIVITAMIN WITH MINERALS) TABS tablet Take 1 tablet by mouth daily.    . naproxen sodium (ALEVE) 220 MG tablet Take 440 mg by mouth daily.    . Omega-3 Fatty Acids (FISH OIL) 1000 MG CAPS Take 1,000 mg by mouth daily.     . sertraline (ZOLOFT) 100 MG tablet Take 100 mg by mouth daily.    . cephALEXin (KEFLEX) 500 MG capsule Take 1 capsule (500 mg total) by mouth 3 (three) times daily. (Patient not taking: Reported on 01/24/2018) 21 capsule 0  . Hyaluronic Acid 20-60 MG  CAPS Take by mouth.      No results found for this or any previous visit (from the past 48 hour(s)). No results found.  ROS  General ROS: Negative Psychological ROS: Negative Ophthalmic ROS: Negative ENT ROS: Negative Hematological and Lymphatic ROS: Negative  Endocrine ROS: Negative Respiratory ROS: Negative Cardiovascular ROS: Negative Gastrointestinal ROS: Negative Genito-Urinary ROS: Negative Musculoskeletal ROS: Positive for back pain Neurological ROS: Positive right leg pain Dermatological ROS: Negative   Blood pressure 129/67, pulse 75, temperature (!) 97.5 F (36.4 C), temperature source Oral, resp. rate 16, height 5' 0.5" (1.537 m), weight 55.3 kg, SpO2 100 %. Physical Exam  General appearance: Alert, cooperative, in no acute distress Head: Normocephalic, atraumatic Eyes: Normal, EOM intact Oropharynx: Moist  without lesions Back: No tenderness to palpation of lumbar spine CV: Regular rate and rhythm Pulm: Clear to auscultation Ext: No edema in LE bilaterally, warm extremities  Neurologic exam:  Mental status: alertness: alert, affect: normal Speech: fluent and clear Motor:strength symmetric 5/5, normal muscle mass and tone in bilateral lower extremities Sensory: intact to light touch in the bilateral lower extremities Reflexes: 2+ and symmetric bilaterally for patella Gait: normal   MRI lumbar spine:Lumbar scoliosis with advanced multilevel disc and facet degeneration, most notable at L4-5 where there is moderate to severe spinal stenosis and severe right neural foraminal stenosis. There is milder stenosis at right L5/S1   Assessment/Plan We will plan on right side decompression at L4/5 and L5/S1  Deetta Perla, MD 02/06/2018, 6:45 AM

## 2018-02-06 NOTE — Anesthesia Preprocedure Evaluation (Signed)
Anesthesia Evaluation  Patient identified by MRN, date of birth, ID band Patient awake    Reviewed: Allergy & Precautions, NPO status   History of Anesthesia Complications Negative for: history of anesthetic complications  Airway Mallampati: III       Dental   Pulmonary neg sleep apnea, neg COPD, former smoker,           Cardiovascular hypertension, Pt. on medications (-) Past MI and (-) CHF (-) dysrhythmias (-) Valvular Problems/Murmurs     Neuro/Psych neg Seizures Depression    GI/Hepatic Neg liver ROS, neg GERD  ,  Endo/Other  negative endocrine ROS  Renal/GU S/p renal CA, S/P partial nephrectomy     Musculoskeletal   Abdominal   Peds  Hematology   Anesthesia Other Findings   Reproductive/Obstetrics                            Anesthesia Physical Anesthesia Plan  ASA: III  Anesthesia Plan: General   Post-op Pain Management:    Induction: Intravenous  PONV Risk Score and Plan: 3 and Dexamethasone, Ondansetron and Midazolam  Airway Management Planned: Oral ETT  Additional Equipment:   Intra-op Plan:   Post-operative Plan:   Informed Consent: I have reviewed the patients History and Physical, chart, labs and discussed the procedure including the risks, benefits and alternatives for the proposed anesthesia with the patient or authorized representative who has indicated his/her understanding and acceptance.     Plan Discussed with:   Anesthesia Plan Comments:         Anesthesia Quick Evaluation

## 2018-02-06 NOTE — Progress Notes (Signed)
Notified Marin Olp, PA of unrelieved headache pain with oxycodone but pain has not gotten any worse. PA acknowledge and nursing to call if it gets worse

## 2018-02-06 NOTE — Progress Notes (Addendum)
Procedure: Right L4-5 lumbar decompression Procedure date: 02/06/2018 Diagnosis: Lumbar radiculopathy  History: Meghan Myers is POD0 s/p L4-5 Lumbar decompression for right-sided lumbar radiculopathy.  Tolerated procedure well.  Evaluated in recovery still disoriented from anesthesia, but able to obey commands and answer questions appropriately.  Denies any back pain or lower extremity pain/numbness/tingling.  Physical Exam: Vitals:   02/06/18 0956 02/06/18 1006  BP:  (!) 133/47  Pulse:  85  Resp:  19  Temp:    SpO2: 98% 98%    AA Ox3 Strength:5/5 throughout lower extremities Sensation: Intact and symmetric throughout lower extremities  Data:  Recent Labs  Lab 01/31/18 1036  NA 142  K 4.0  CL 102  CO2 33*  BUN 31*  CREATININE 0.63  GLUCOSE 112*  CALCIUM 10.1   No results for input(s): AST, ALT, ALKPHOS in the last 168 hours.  Invalid input(s): TBILI   Recent Labs  Lab 01/31/18 1036  WBC 4.2  HGB 13.9  HCT 42.5  PLT 307   Recent Labs  Lab 01/31/18 1036  APTT 30  INR 0.91         Other tests/results: No imaging reviewed  Assessment/Plan:  Meghan Myers is POD 0 s/p L4-5 lumbar decompression.  She is doing well and symptoms prior to surgery seem to be resolved at this time.  Due to small CSF leak will continue to observe to monitor potential headache and to ensure appropriate positioning.  -Positioning instructions.  Lay flat until approximately 6 PM when head can be raised up to 45 degrees.  Be late at 6 AM - pain control - DVT prophylaxis  Marin Olp PA-C Department of Neurosurgery

## 2018-02-06 NOTE — Anesthesia Procedure Notes (Signed)

## 2018-02-06 NOTE — Addendum Note (Signed)
Addendum  created 02/06/18 1613 by Lance Muss, CRNA   Intraprocedure Event edited

## 2018-02-06 NOTE — Anesthesia Post-op Follow-up Note (Signed)
Anesthesia QCDR form completed.        

## 2018-02-06 NOTE — Progress Notes (Signed)
Made a call to Marin Olp, PA due to not seeing any post-operative antibiotic. Per [name] they dose pre-operatively

## 2018-02-06 NOTE — Interval H&P Note (Signed)
History and Physical Interval Note:  02/06/2018 6:47 AM  Meghan Myers  has presented today for surgery, with the diagnosis of [place], RIGHT LEG PAIN  The various methods of treatment have been discussed with the patient and family. After consideration of risks, benefits and other options for treatment, the patient has consented to  Procedure(s): LUMBAR HEMI-LAMINECTOMY AND FORAMINOTOMYWITH DISCECTOMY 1 LEVEL (Right) as a surgical intervention .  The patient's history has been reviewed, patient examined, no change in status, stable for surgery.  I have reviewed the patient's chart and labs.  Questions were answered to the patient's satisfaction.     Deetta Perla

## 2018-02-06 NOTE — Care Management Note (Signed)
Case Management Note  Patient Details  Name: Meghan Myers MRN: [id number] Date of Birth: [birth year]  Subjective/Objective:                   RNCM met with patient regarding discharge planning. She asked me "if my name was [name]". Patient said "she received a text message from [name] stating that she was her home health agency".  [name] works for [facility] which was explained to patient and her surgeon's office prefers [facility] and that is probably where the referral came from.  She was not happy that [place] sent her a text message.  She also said that she does not feel that she will need home health PT and "feels that her privacy has been invaded".  Patient has no DME at home as she is independent with mobility at baseline.  [name] PA updated.  Action/Plan: List of home health care provided per CMS.gov with 3-5 star rating. She has also been notified that [facility]. RNCM will follow.  Expected Discharge Date:  02/07/18               Expected Discharge Plan:     In-House Referral:     Discharge planning Services  CM Consult  Post Acute Care Choice:    Choice offered to:  Patient  DME Arranged:    DME Agency:     HH Arranged:    [place] Agency:     Status of Service:  In process, will continue to follow  If discussed at Long Length of Stay Meetings, dates discussed:    Additional Comments:  Marshell Garfinkel, RN 02/06/2018, 2:40 PM

## 2018-02-06 NOTE — Op Note (Signed)
Operative Note  SURGERY DATE:02/06/2018  PRE-OP DIAGNOSIS: Lumbar Stenosis withLumbar Radiculopathy(m48.062)  POST-OP DIAGNOSIS:Post-Op Diagnosis Codes: Lumbar Stenosis withLumbar Radiculopathy(m48.062)  Procedure(s) with comments: Right L4/5Hemilaminectomywith Facetectomy and Foraminotomy  SURGEON:  * Malen Gauze, MD Marin Olp, PA Assistant  ANESTHESIA:General  OPERATIVE FINDINGS: Lateral recess stenosis at right L4/5  OPERATIVE REPORT:   Indication: Ms. Tapppresented to clinic on11/12with ongoingback and rightleg pain.She had failed conservative management including PT, steroid injections, and prescription medications. MRI revealed right L4/5stenosis compressingthetraversingnerve root with a small disc herniation.Therisks of surgery were explained to include hematoma, infection, damage to nerve roots, CSF leak, weakness, numbness, pain, need for future surgery including fusion, heart attack, and stroke.She elected to proceed with surgery for symptom relief.   Procedure The patient was brought to the OR after informed consent was obtained.She was given general anesthesia and intubated by the anesthesia service. Vascular access lines were placed.The patient was then placed prone on a Wilson frameensuring all pressure points were padded.Antibiotics were administered.A time-out was performed per protocol.   The patient was sterilely prepped and draped. Fluoroscopy confirmedL4/5interspaceandanincision was planned 1.5cm off midline on the right.The incision was instilled withlocal anesthetic with epinephrine. The skin was opened sharply and the dissection taken to the fascia. This was incised and initial dilator placed the spinous processes and lamina of L4 on the rightout to the medial edge of the facet. Serial dilators were inserted via fluoroscopy and the final 60mm tube was placed at depth of 5cm.   The microscope  was brought into the field. The overlying muscle was removed from lamina and medial facet. Next, a matchstickdrill bit was used to remove the L4lamina centrally and going laterally.  The underlying ligament was freed and removed with combination of rongeurs. The decompression was taken caudal to the superior border ofL5and then the medial facet was removed. The dura was seen to be full and intact. Once all ligament and soft tissue was removed, attention was turned to inspection of the nerve root. The nerve root and thecal sac was attempted to be retracted medially but was significantly tethered with scar tissue noted. Additional bone was removed rostrally and laterally. The dura was again decompressed with removal of additional soft tissue.  The disc space was palpated and fluoroscopy confirmed the level but no large disc herniation was seen. The dura was then gently retracted again but the scarring prevented mobilization. There was appearance of small amount of clear fluid with no obvious durotomy and the thecal sac remained full. A blunt instrument was passed along the nerve root and no further compression seen in the lateral recess or foramen.Multiple rounds of irrigation were used to remove all debris.Hemostasis was obtained.Duragen and duraseal exact was placed over dura. The microscope was removed.   The muscle andfasciawas then closed using 0 vicryl followed by thesubcutaneous and dermal layers with 2-0 vicryluntil the epidermis was well approximated. The skin was closed with running Nylon. A dressing was applied.  The patient was returned to supine position and extubated by the anesthesia service. The patient was then taken to the PACU for post-operative care whereshe was moving extremities symmetrically.   ESTIMATED BLOOD LOSS: 20cc  SPECIMENS None  IMPLANT None   I performed the case in its entiretywith assistance of PA, Corrie Mckusick, [employer]

## 2018-02-07 DIAGNOSIS — M48061 Spinal stenosis, lumbar region without neurogenic claudication: Secondary | ICD-10-CM | POA: Diagnosis not present

## 2018-02-07 MED ORDER — BACLOFEN 10 MG PO TABS: 10.0000 mg | ORAL_TABLET | Freq: Three times a day (TID) | ORAL | 0 refills | Status: DC

## 2018-02-07 MED ORDER — OXYCODONE HCL 5 MG PO TABS: 5.0000 mg | ORAL_TABLET | ORAL | 0 refills | Status: DC | PRN

## 2018-02-07 NOTE — Discharge Summary (Signed)
Procedure: Right L4-5 lumbar decompression Procedure date: 02/06/2018 Diagnosis: Lumbar radiculopathy  History:  POD1: complained of headache yesterday afternoon and evening but this has resolved. She has ambulated, voided, and eaten without issue. Symptoms prior to surgery have resolved except for some dull right hip/thigh pain that is significantly more tolerable than the [pain that was present prior to surgery. Also complains of right lateral ankle numbness.  Denies any other pain/numbness/tingling/weakness.   POD0: s/p L4-5 Lumbar decompression for right-sided lumbar radiculopathy.  Tolerated procedure well.  Evaluated in recovery still disoriented from anesthesia, but able to obey commands and answer questions appropriately.  Denies any back pain or lower extremity pain/numbness/tingling.  Physical Exam: Vitals:   02/07/18 0505 02/07/18 0748  BP: (!) 123/57 (!) 130/57  Pulse: 69 78  Resp: 19 18  Temp: 97.9 F (36.6 C) (!) 97.5 F (36.4 C)  SpO2: 97% 100%    AA Ox3 Strength:5/5 throughout lower extremities Sensation: Intact and symmetric throughout lower extremities, except at right lateral ankle  Skin: Dr. Lacinda Axon examined and reported findings: incision intact. Scant blood on dressing. Mild swelling but no fluid drainage. Dressing was changed.   Data:  Recent Labs  Lab 01/31/18 1036  NA 142  K 4.0  CL 102  CO2 33*  BUN 31*  CREATININE 0.63  GLUCOSE 112*  CALCIUM 10.1   No results for input(s): AST, ALT, ALKPHOS in the last 168 hours.  Invalid input(s): TBILI   Recent Labs  Lab 01/31/18 1036  WBC 4.2  HGB 13.9  HCT 42.5  PLT 307   Recent Labs  Lab 01/31/18 1036  APTT 30  INR 0.91         Other tests/results: No imaging reviewed  Assessment/Plan:  Meghan Myers is POD 1 s/p L4-5 lumbar decompression.  She is recovering well. Discussed wound care and physical activity restrictions. Will continue pain control with tylenol, baclofen, and oxycodone as  needed. She is scheduled to follow up in clinic on 02/17/2018. Advised to contact office if any questions or concerns arise before then.   Marin Olp PA-C Department of Neurosurgery

## 2018-02-07 NOTE — Progress Notes (Signed)
Discharge summary reviewed with verbal understanding. Rx given upon discharge. Escorted to personal vehicle via wc

## 2018-04-05 ENCOUNTER — Other Ambulatory Visit (HOSPITAL_COMMUNITY): Payer: Self-pay | Admitting: Neurosurgery

## 2018-04-05 ENCOUNTER — Other Ambulatory Visit: Payer: Self-pay | Admitting: Neurosurgery

## 2018-04-05 DIAGNOSIS — M5441 Lumbago with sciatica, right side: Secondary | ICD-10-CM

## 2018-04-14 ENCOUNTER — Ambulatory Visit
Admission: RE | Admit: 2018-04-14 | Discharge: 2018-04-14 | Disposition: A | Discharge status: Home / Self Care | Payer: Medicare Other | Source: Ambulatory Visit | Attending: Neurosurgery | Admitting: Neurosurgery

## 2018-04-14 DIAGNOSIS — M5441 Lumbago with sciatica, right side: Secondary | ICD-10-CM | POA: Diagnosis present

## 2018-04-14 MED ORDER — GADOBUTROL 1 MMOL/ML IV SOLN
5.0000 mL | Freq: Once | INTRAVENOUS | Status: AC | PRN
  Administered 2018-04-14: 5 mL via INTRAVENOUS

## 2019-03-08 IMAGING — MR MR SHOULDER*R* W/O CM
5 series · 40 of 40 positions shown · non-contrast
Comparison: None.

CLINICAL DATA: 67-year-old female with remote history of [place]
surgery in 6455 heard a pop 2 months ago while lifting. Right
shoulder pain more so superiorly with limited range of motion.

EXAM:
MRI OF THE RIGHT SHOULDER WITHOUT CONTRAST
TECHNIQUE: Multiplanar, multisequence MR imaging of the shoulder was performed.
No intravenous contrast was administered.

[Series 5: T2 fat-sat · axial · 4.0mm · 0.47mm/px · z∈[+67,+155]mm · 8 of 21 slices shown (1 of 3)]
[im 1/21]
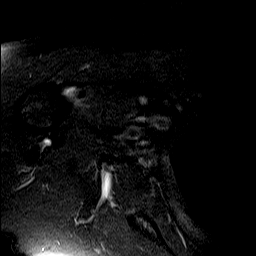
[im 3/21]
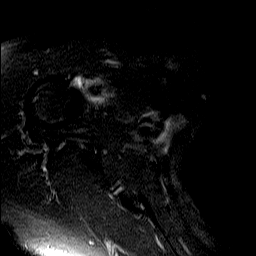
[im 6/21]
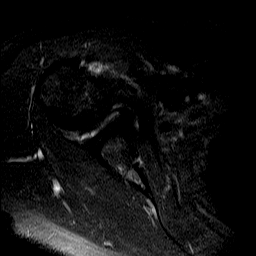
[im 9/21]
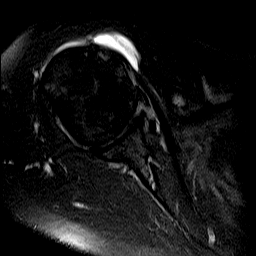
[im 12/21]
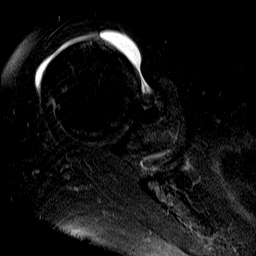
[im 15/21]
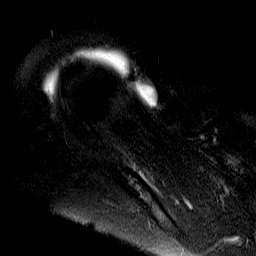
[im 18/21]
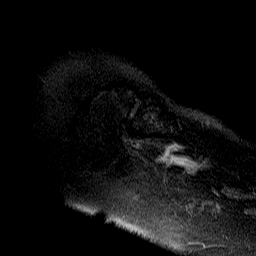
[im 21/21]
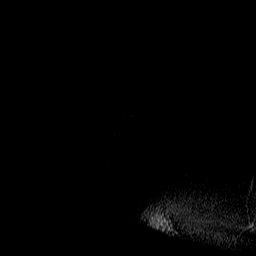

[Series 6: T2 fat-sat · oblique · 4.0mm · 0.62mm/px · 8 of 19 slices shown (2 of 3)]
[im 1/19]
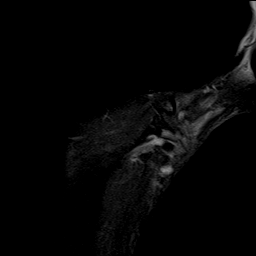
[im 3/19]
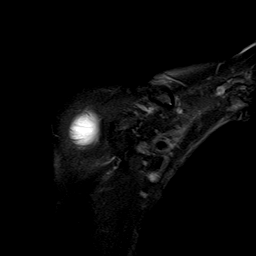
[im 6/19]
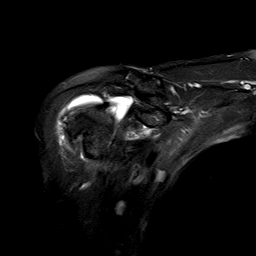
[im 8/19]
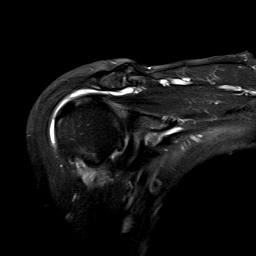
[im 11/19]
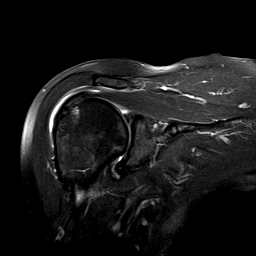
[im 13/19]
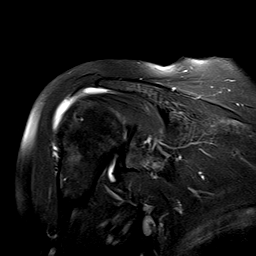
[im 16/19]
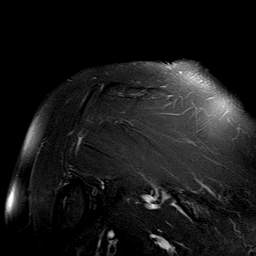
[im 19/19]
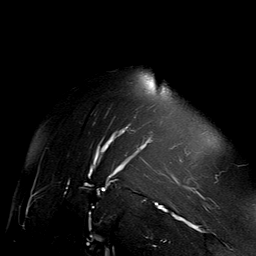

[Series 7: PD · oblique · 4.0mm · 0.62mm/px · 8 of 19 slices shown]
[im 1/19]
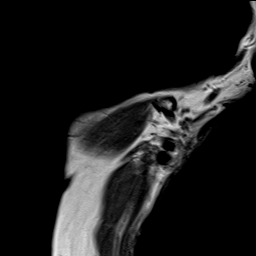
[im 3/19]
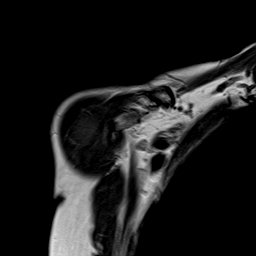
[im 6/19]
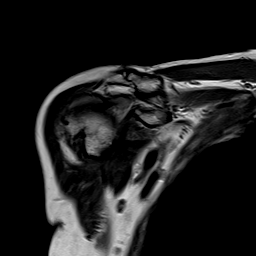
[im 8/19]
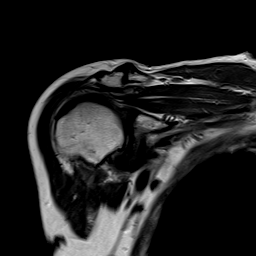
[im 11/19]
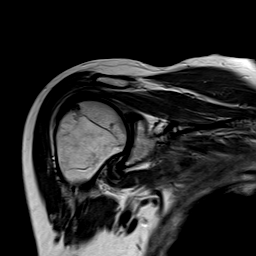
[im 13/19]
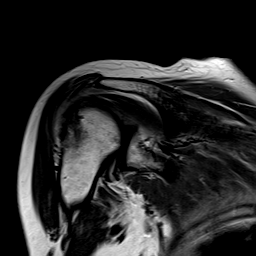
[im 16/19]
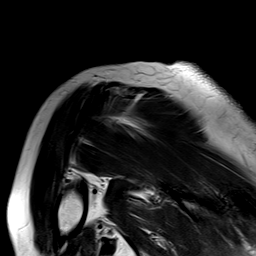
[im 19/19]
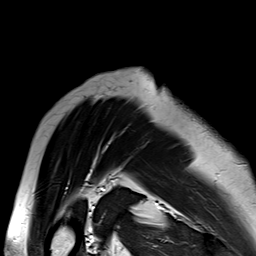

[Series 8: T1 · oblique · 4.0mm · 0.62mm/px · 8 of 19 slices shown]
[im 1/19]
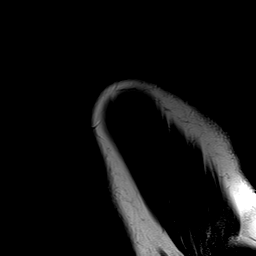
[im 3/19]
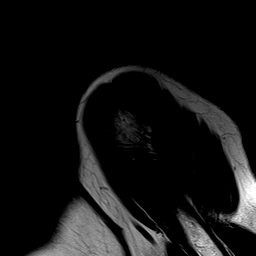
[im 6/19]
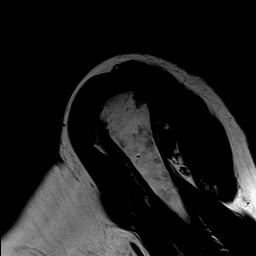
[im 8/19]
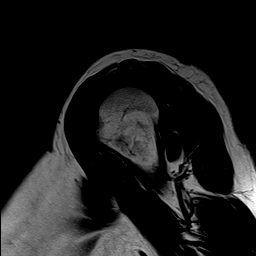
[im 11/19]
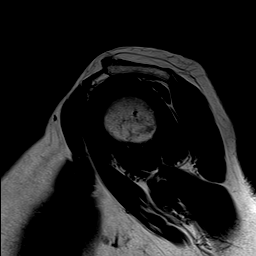
[im 13/19]
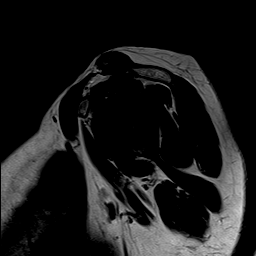
[im 16/19]
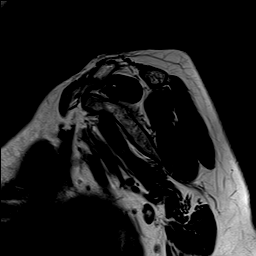
[im 19/19]
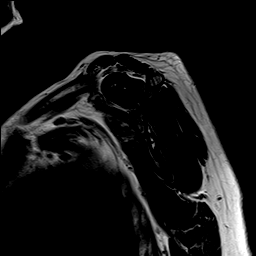

[Series 9: T2 fat-sat · oblique · 4.0mm · 0.62mm/px · 8 of 19 slices shown (3 of 3)]
[im 1/19]
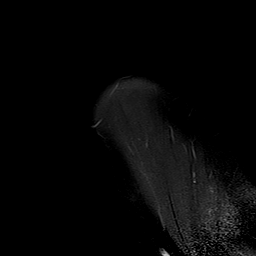
[im 3/19]
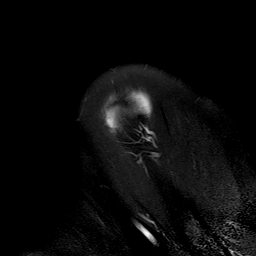
[im 6/19]
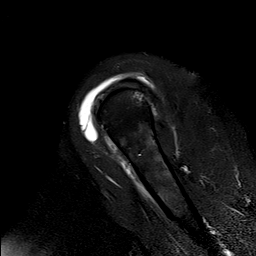
[im 8/19]
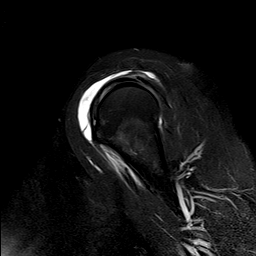
[im 11/19]
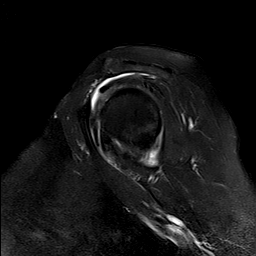
[im 13/19]
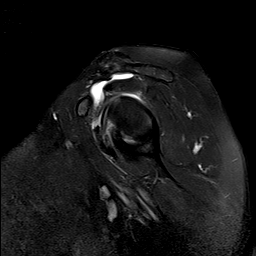
[im 16/19]
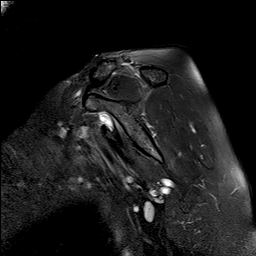
[im 19/19]
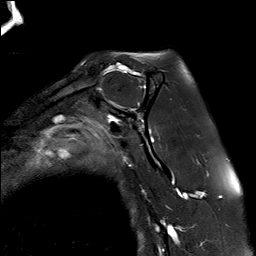

[40 of 40 positions shown; findings below may reference images not displayed]

FINDINGS: Rotator cuff: Tiny articular surface footplate tear along the
anterior fibers of the supraspinatus, series [DATE]. Tendinosis of the
supraspinatus and infraspinatus tendon is otherwise noted. The
subscapular and teres minor tendons are intact.

Muscles:  No muscle atrophy.

Biceps long head: Suspected tear of the vertical portion of the
biceps tendon within the biceps groove given what appears be an
empty biceps groove apart from fluid, series [DATE].

Acromioclavicular Joint: Postop deformity of the distal clavicle.
Moderate osteoarthritic spurring across the AC joint. Type 2 curved
acromion. Small amount of subacromial and subdeltoid bursal fluid
compatible with bursitis.

Glenohumeral Joint: No joint effusion. No chondral defect.

Labrum: Grossly intact, but evaluation is limited by lack of
intraarticular fluid.

Bones:  No marrow abnormality, fracture or dislocation.

Other: None
IMPRESSION: 1. The biceps tendon is not identified within the biceps groove and
findings are suspicious for a proximal biceps tendon tear along the
vertical portion.
2. Tiny footplate tear along the articular surface of the anterior
supraspinatus tendon. Tendinosis of the supraspinatus and
infraspinatus tendons.
3. Subacromial and subdeltoid bursitis.
4. AC joint osteoarthritis.

## 2019-03-23 ENCOUNTER — Other Ambulatory Visit: Payer: Self-pay | Admitting: Family Medicine

## 2019-03-23 DIAGNOSIS — Z1231 Encounter for screening mammogram for malignant neoplasm of breast: Secondary | ICD-10-CM

## 2019-04-04 ENCOUNTER — Other Ambulatory Visit: Payer: Self-pay

## 2019-04-04 ENCOUNTER — Ambulatory Visit
Admission: RE | Admit: 2019-04-04 | Discharge: 2019-04-04 | Disposition: A | Discharge status: Home / Self Care | Payer: Medicare Other | Source: Ambulatory Visit | Attending: Family Medicine | Admitting: Family Medicine

## 2019-04-04 DIAGNOSIS — Z1231 Encounter for screening mammogram for malignant neoplasm of breast: Secondary | ICD-10-CM | POA: Diagnosis not present

## 2020-02-19 ENCOUNTER — Ambulatory Visit (INDEPENDENT_AMBULATORY_CARE_PROVIDER_SITE_OTHER): Payer: Medicare Other | Admitting: Dermatology

## 2020-02-19 ENCOUNTER — Other Ambulatory Visit: Payer: Self-pay

## 2020-02-19 DIAGNOSIS — L820 Inflamed seborrheic keratosis: Secondary | ICD-10-CM | POA: Diagnosis not present

## 2020-02-19 DIAGNOSIS — L570 Actinic keratosis: Secondary | ICD-10-CM

## 2020-02-19 DIAGNOSIS — L821 Other seborrheic keratosis: Secondary | ICD-10-CM

## 2020-02-19 DIAGNOSIS — Z1283 Encounter for screening for malignant neoplasm of skin: Secondary | ICD-10-CM

## 2020-02-19 DIAGNOSIS — D1800 Hemangioma unspecified site: Secondary | ICD-10-CM

## 2020-02-19 DIAGNOSIS — W57XXXA Bitten or stung by nonvenomous insect and other nonvenomous arthropods, initial encounter: Secondary | ICD-10-CM

## 2020-02-19 DIAGNOSIS — D229 Melanocytic nevi, unspecified: Secondary | ICD-10-CM

## 2020-02-19 DIAGNOSIS — L720 Epidermal cyst: Secondary | ICD-10-CM | POA: Diagnosis not present

## 2020-02-19 DIAGNOSIS — Z86007 Personal history of in-situ neoplasm of skin: Secondary | ICD-10-CM

## 2020-02-19 DIAGNOSIS — S0086XA Insect bite (nonvenomous) of other part of head, initial encounter: Secondary | ICD-10-CM

## 2020-02-19 DIAGNOSIS — D692 Other nonthrombocytopenic purpura: Secondary | ICD-10-CM

## 2020-02-19 DIAGNOSIS — L814 Other melanin hyperpigmentation: Secondary | ICD-10-CM

## 2020-02-19 NOTE — Patient Instructions (Signed)

## 2020-02-19 NOTE — Progress Notes (Signed)
Follow-Up Visit   Subjective  Meghan Myers is a 69 y.o. 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 She does have a scab at right leg that has been there for about 1 month.).  She has a few scaly bumps on her arms that she picks at.  The patient presents for Total-Body Skin Exam (TBSE) for skin cancer screening and mole check.   The following portions of the chart were reviewed this encounter and updated as appropriate:       Review of Systems:  No other skin or systemic complaints except as noted in HPI or Assessment and Plan.  Objective  Well appearing patient in no apparent distress; mood and affect are within normal limits.  A full examination was performed including scalp, head, eyes, ears, nose, lips, neck, chest, axillae, abdomen, back, buttocks, bilateral upper extremities, bilateral lower extremities, hands, feet, fingers, toes, fingernails, and toenails. All findings within normal limits unless otherwise noted below.  Objective  Right lateral calf x 1, L lower pretibia x 1, L forearm x 1, R forearm x 1 (4): Erythematous keratotic or waxy stuck-on papule  Objective  Left nasal tip, upper lip, chest: Smooth white papule(s).   Objective  left mid cheek: Pink excoriated papule  Objective  Left posterior thigh: Waxy tan patch 1.0cm  Objective  nasal dorsum: Erythematous thin papules/macules with gritty scale.    Assessment & Plan  Inflamed seborrheic keratosis (4) Right lateral calf x 1, L lower pretibia x 1, L forearm x 1, R forearm x 1  Destruction of lesion - Right lateral calf x 1, L lower pretibia x 1, L forearm x 1, R forearm x 1  Destruction method: cryotherapy   Informed consent: discussed and consent obtained   Lesion destroyed using liquid nitrogen: Yes   Region frozen until ice ball extended beyond lesion: Yes   Outcome: patient tolerated procedure well with no complications    Post-procedure details: wound care instructions given    Milia Left nasal tip, upper lip, chest  Benign, observe.     Start Tazorac 0.05% at bedtime as tolerated. Sample given to patient.  Discussed extraction if symptomatic   Insect bite of face with local reaction, initial encounter left mid cheek  Benign-appearing.  Observation.  Call back if doesn't resolve.  Seborrheic keratosis Left posterior thigh  Benign, observe.     AK (actinic keratosis) nasal dorsum  Destruction of lesion - nasal dorsum  Destruction method: cryotherapy   Informed consent: discussed and consent obtained   Lesion destroyed using liquid nitrogen: Yes   Region frozen until ice ball extended beyond lesion: Yes   Outcome: patient tolerated procedure well with no complications   Post-procedure details: wound care instructions given    Lentigines - Scattered tan macules - Discussed due to sun exposure - Benign, observe - Call for any changes  Seborrheic Keratoses - Stuck-on, waxy, tan-brown papules and plaques  - Discussed benign etiology and prognosis. - Observe - Call for any changes  Melanocytic Nevi - Tan-brown and/or pink-flesh-colored symmetric macules and papules left foot - Benign appearing on exam today - Observation - Call clinic for new or changing moles - Recommend daily use of broad spectrum spf 30+ sunscreen to sun-exposed areas.   Hemangiomas - Red papules - Discussed benign nature - Observe - Call for any changes  Actinic Damage - Chronic, secondary to cumulative UV/sun exposure -  diffuse scaly erythematous macules with underlying dyspigmentation - Recommend daily broad spectrum sunscreen SPF 30+ to sun-exposed areas, reapply every 2 hours as needed.  - Call for new or changing lesions.  Skin cancer screening performed today.  History of Squamous Cell Carcinoma in Situ of the Skin - No evidence of recurrence today at right 3rd finger at base - Recommend  regular full body skin exams - Recommend daily broad spectrum sunscreen SPF 30+ to sun-exposed areas, reapply every 2 hours as needed.  - Call if any new or changing lesions are noted between office visits  Purpura - Chronic; persistent and recurrent.  Treatable, but not curable. - Violaceous macules and patches at arms - Benign - Related to age, sun damage and/or use of blood thinners - Observe - Can use OTC arnica containing moisturizer such as Dermend Bruise Formula if desired - Call for worsening or other concerns   Return in about 1 year (around 02/18/2021) for TBSE.  Graciella Belton, RMA, am acting as scribe for Brendolyn Patty, MD . Documentation: I have reviewed the above documentation for accuracy and completeness, and I agree with the above.  Brendolyn Patty MD

## 2020-03-18 ENCOUNTER — Other Ambulatory Visit: Payer: Self-pay | Admitting: Orthopedic Surgery

## 2020-03-18 DIAGNOSIS — M2392 Unspecified internal derangement of left knee: Secondary | ICD-10-CM

## 2020-03-18 DIAGNOSIS — G8929 Other chronic pain: Secondary | ICD-10-CM

## 2020-03-22 ENCOUNTER — Other Ambulatory Visit: Payer: Self-pay

## 2020-03-22 ENCOUNTER — Ambulatory Visit
Admission: RE | Admit: 2020-03-22 | Discharge: 2020-03-22 | Disposition: A | Discharge status: Home / Self Care | Payer: Medicare Other | Source: Ambulatory Visit | Attending: Orthopedic Surgery | Admitting: Orthopedic Surgery

## 2020-03-22 DIAGNOSIS — G8929 Other chronic pain: Secondary | ICD-10-CM | POA: Diagnosis present

## 2020-03-22 DIAGNOSIS — M25562 Pain in left knee: Secondary | ICD-10-CM | POA: Insufficient documentation

## 2020-03-22 DIAGNOSIS — M2392 Unspecified internal derangement of left knee: Secondary | ICD-10-CM | POA: Insufficient documentation

## 2020-04-29 ENCOUNTER — Encounter
Admission: RE | Admit: 2020-04-29 | Discharge: 2020-04-29 | Disposition: A | Discharge status: Home / Self Care | Payer: Medicare Other | Source: Ambulatory Visit | Attending: Orthopedic Surgery | Admitting: Orthopedic Surgery

## 2020-04-29 ENCOUNTER — Other Ambulatory Visit: Payer: Self-pay

## 2020-04-29 DIAGNOSIS — I10 Essential (primary) hypertension: Secondary | ICD-10-CM | POA: Insufficient documentation

## 2020-04-29 DIAGNOSIS — Z01818 Encounter for other preprocedural examination: Secondary | ICD-10-CM | POA: Insufficient documentation

## 2020-04-29 DIAGNOSIS — E785 Hyperlipidemia, unspecified: Secondary | ICD-10-CM | POA: Diagnosis not present

## 2020-04-29 LAB — CBC
HCT: 42.4 % (ref 36.0–46.0)
Hemoglobin: 14.7 g/dL (ref 12.0–15.0)
MCH: 30.9 pg (ref 26.0–34.0)
MCHC: 34.7 g/dL (ref 30.0–36.0)
MCV: 89.3 fL (ref 80.0–100.0)
Platelets: 288 10*3/uL (ref 150–400)
RBC: 4.75 MIL/uL (ref 3.87–5.11)
RDW: 12.1 % (ref 11.5–15.5)
WBC: 5.7 10*3/uL (ref 4.0–10.5)
nRBC: 0 % (ref 0.0–0.2)

## 2020-04-29 NOTE — Patient Instructions (Signed)
Your procedure is scheduled on: 05/05/20 Report to [place] first stop at Admitting Desk. To find out your arrival time please call [phone] between 1PM - 3PM on 05/02/20.  Remember: Instructions that are not followed completely may result in serious medical risk, up to and including death, or upon the discretion of your surgeon and anesthesiologist your surgery may need to be rescheduled.     _X__ 1. Do not eat food after midnight the night before your procedure.                 No gum chewing or hard candies. You may drink clear liquids up to 2 hours                 before you are scheduled to arrive for your surgery- DO not drink clear                 liquids within 2 hours of the start of your surgery.                 Clear Liquids include:  water, apple juice without pulp, clear carbohydrate                 drink such as Clearfast or Gatorade, Black Coffee or Tea (Do not add                 anything to coffee or tea). Diabetics water only  ENSURE PRE SURGERY DRINK 2 HOURS BEFORE ARRIVING TO THE SURGERY  __X__2.  On the morning of surgery brush your teeth with toothpaste and water, you                 may rinse your mouth with mouthwash if you wish.  Do not swallow any              toothpaste of mouthwash.     _X__ 3.  No Alcohol for 24 hours before or after surgery.   _X__ 4.  Do Not Smoke or use e-cigarettes For 24 Hours Prior to Your Surgery.                 Do not use any chewable tobacco products for at least 6 hours prior to                 surgery.  ____  5.  Bring all medications with you on the day of surgery if instructed.   __X__  6.  Notify your doctor if there is any change in your medical condition      (cold, fever, infections).     Do not wear jewelry, make-up, hairpins, clips or nail polish. Do not wear lotions, powders, or perfumes.  Do not shave 48 hours prior to surgery. Men may shave face and neck. Do  not bring valuables to the hospital.    [facility] is not responsible for any belongings or valuables.  Contacts, dentures/partials or body piercings may not be worn into surgery. Bring a case for your contacts, glasses or hearing aids, a denture cup will be supplied. Leave your suitcase in the car. After surgery it may be brought to your room. For patients admitted to the hospital, discharge time is determined by your treatment team.   Patients discharged the day of surgery will not be allowed to drive home.   Please read over the following fact sheets that you were given:   MRSA  Information, CHG soap  __X__ Take these medicines the morning of surgery with A SIP OF WATER:    1. amLODipine (NORVASC) 2.5 MG tablet  2. atorvastatin (LIPITOR) 20 MG tablet  3. sertraline (ZOLOFT) 100 MG tablet  4.  5.  6.  ____ Fleet Enema (as directed)   __X__ Use CHG Soap/SAGE wipes as directed  ____ Use inhalers on the day of surgery  ____ Stop metformin/Janumet/Farxiga 2 days prior to surgery    ____ Take 1/2 of usual insulin dose the night before surgery. No insulin the morning          of surgery.   ____ Stop Blood Thinners Coumadin/Plavix/Xarelto/Pleta/Pradaxa/Eliquis/Effient/Aspirin  on   Or contact your Surgeon, Cardiologist or Medical Doctor regarding  ability to stop your blood thinners  __X__ Stop Anti-inflammatories 7 days before surgery such as Advil, Ibuprofen, Motrin,  BC or Goodies Powder, Naprosyn, Naproxen, Aleve, Aspirin   Excedrin  __X__ Stop all herbal supplements, fish oil or vitamin E until after surgery.  Today 04/29/20  ____ Bring C-Pap to the hospital.

## 2020-05-01 ENCOUNTER — Other Ambulatory Visit: Payer: Self-pay

## 2020-05-01 ENCOUNTER — Other Ambulatory Visit
Admission: RE | Admit: 2020-05-01 | Discharge: 2020-05-01 | Disposition: A | Discharge status: Home / Self Care | Payer: Medicare Other | Source: Ambulatory Visit | Attending: Orthopedic Surgery | Admitting: Orthopedic Surgery

## 2020-05-01 DIAGNOSIS — Z01812 Encounter for preprocedural laboratory examination: Secondary | ICD-10-CM | POA: Insufficient documentation

## 2020-05-01 DIAGNOSIS — Z20822 Contact with and (suspected) exposure to covid-19: Secondary | ICD-10-CM | POA: Diagnosis not present

## 2020-05-01 LAB — SARS CORONAVIRUS 2 (TAT 6-24 HRS): SARS Coronavirus 2: NEGATIVE

## 2020-05-04 ENCOUNTER — Encounter: Payer: Self-pay | Admitting: Orthopedic Surgery

## 2020-05-04 DIAGNOSIS — E7800 Pure hypercholesterolemia, unspecified: Secondary | ICD-10-CM | POA: Insufficient documentation

## 2020-05-04 MED ORDER — LACTATED RINGERS IV SOLN: INTRAVENOUS | Status: DC

## 2020-05-04 MED ORDER — CHLORHEXIDINE GLUCONATE 0.12 % MT SOLN: 15.0000 mL | Freq: Once | OROMUCOSAL | Status: AC

## 2020-05-04 MED ORDER — ORAL CARE MOUTH RINSE: 15.0000 mL | Freq: Once | OROMUCOSAL | Status: AC

## 2020-05-04 MED ORDER — FAMOTIDINE 20 MG PO TABS: 20.0000 mg | ORAL_TABLET | Freq: Once | ORAL | Status: AC

## 2020-05-04 MED ORDER — CELECOXIB 200 MG PO CAPS: 400.0000 mg | ORAL_CAPSULE | Freq: Once | ORAL | Status: AC
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
## 2020-05-05 ENCOUNTER — Encounter
Admission: RE | Disposition: A | Discharge status: Home / Self Care | Payer: Self-pay | Source: Home / Self Care | Attending: Orthopedic Surgery

## 2020-05-05 ENCOUNTER — Ambulatory Visit: Payer: Medicare Other | Admitting: Certified Registered"

## 2020-05-05 ENCOUNTER — Ambulatory Visit
Admission: RE | Admit: 2020-05-05 | Discharge: 2020-05-05 | Disposition: A | Discharge status: Home / Self Care | Payer: Medicare Other | Attending: Orthopedic Surgery | Admitting: Orthopedic Surgery

## 2020-05-05 ENCOUNTER — Encounter: Payer: Self-pay | Admitting: Orthopedic Surgery

## 2020-05-05 ENCOUNTER — Other Ambulatory Visit: Payer: Self-pay

## 2020-05-05 DIAGNOSIS — M069 Rheumatoid arthritis, unspecified: Secondary | ICD-10-CM | POA: Insufficient documentation

## 2020-05-05 DIAGNOSIS — Z882 Allergy status to sulfonamides status: Secondary | ICD-10-CM | POA: Insufficient documentation

## 2020-05-05 DIAGNOSIS — Z85528 Personal history of other malignant neoplasm of kidney: Secondary | ICD-10-CM | POA: Insufficient documentation

## 2020-05-05 DIAGNOSIS — Z79899 Other long term (current) drug therapy: Secondary | ICD-10-CM | POA: Diagnosis not present

## 2020-05-05 DIAGNOSIS — Z87891 Personal history of nicotine dependence: Secondary | ICD-10-CM | POA: Insufficient documentation

## 2020-05-05 DIAGNOSIS — K769 Liver disease, unspecified: Secondary | ICD-10-CM | POA: Insufficient documentation

## 2020-05-05 DIAGNOSIS — M2392 Unspecified internal derangement of left knee: Secondary | ICD-10-CM | POA: Diagnosis not present

## 2020-05-05 DIAGNOSIS — Z8249 Family history of ischemic heart disease and other diseases of the circulatory system: Secondary | ICD-10-CM | POA: Diagnosis not present

## 2020-05-05 DIAGNOSIS — Z888 Allergy status to other drugs, medicaments and biological substances status: Secondary | ICD-10-CM | POA: Diagnosis not present

## 2020-05-05 DIAGNOSIS — M94262 Chondromalacia, left knee: Secondary | ICD-10-CM | POA: Diagnosis not present

## 2020-05-05 DIAGNOSIS — I10 Essential (primary) hypertension: Secondary | ICD-10-CM | POA: Insufficient documentation

## 2020-05-05 DIAGNOSIS — Z8616 Personal history of COVID-19: Secondary | ICD-10-CM | POA: Diagnosis not present

## 2020-05-05 DIAGNOSIS — Z905 Acquired absence of kidney: Secondary | ICD-10-CM | POA: Insufficient documentation

## 2020-05-05 DIAGNOSIS — E7800 Pure hypercholesterolemia, unspecified: Secondary | ICD-10-CM | POA: Insufficient documentation

## 2020-05-05 DIAGNOSIS — Z9889 Other specified postprocedural states: Secondary | ICD-10-CM

## 2020-05-05 HISTORY — PX: KNEE ARTHROSCOPY: SHX127

## 2020-05-05 SURGERY — ARTHROSCOPY, KNEE
Anesthesia: General | Site: Knee | Laterality: Left

## 2020-05-05 MED ORDER — ONDANSETRON HCL 4 MG/2ML IJ SOLN
INTRAMUSCULAR | Status: AC
  Filled 2020-05-05: qty 2

## 2020-05-05 MED ORDER — ONDANSETRON HCL 4 MG/2ML IJ SOLN: 4.0000 mg | Freq: Once | INTRAMUSCULAR | Status: DC | PRN

## 2020-05-05 MED ORDER — ONDANSETRON HCL 4 MG PO TABS: 4.0000 mg | ORAL_TABLET | Freq: Four times a day (QID) | ORAL | Status: DC | PRN

## 2020-05-05 MED ORDER — FENTANYL CITRATE (PF) 100 MCG/2ML IJ SOLN
INTRAMUSCULAR | Status: AC
  Filled 2020-05-05: qty 2

## 2020-05-05 MED ORDER — EPHEDRINE SULFATE 50 MG/ML IJ SOLN
INTRAMUSCULAR | Status: DC | PRN
  Administered 2020-05-05 (×2): 5 mg via INTRAVENOUS
  Administered 2020-05-05: 10 mg via INTRAVENOUS

## 2020-05-05 MED ORDER — HYDROCODONE-ACETAMINOPHEN 5-325 MG PO TABS: 1.0000 | ORAL_TABLET | ORAL | 0 refills | Status: DC | PRN

## 2020-05-05 MED ORDER — EPHEDRINE 5 MG/ML INJ
INTRAVENOUS | Status: AC
  Filled 2020-05-05: qty 10

## 2020-05-05 MED ORDER — PROPOFOL 10 MG/ML IV BOLUS
INTRAVENOUS | Status: AC
  Filled 2020-05-05: qty 20

## 2020-05-05 MED ORDER — MORPHINE SULFATE (PF) 4 MG/ML IV SOLN
INTRAVENOUS | Status: AC
  Filled 2020-05-05: qty 1

## 2020-05-05 MED ORDER — ONDANSETRON HCL 4 MG/2ML IJ SOLN: 4.0000 mg | Freq: Four times a day (QID) | INTRAMUSCULAR | Status: DC | PRN

## 2020-05-05 MED ORDER — FENTANYL CITRATE (PF) 100 MCG/2ML IJ SOLN
INTRAMUSCULAR | Status: DC | PRN
  Administered 2020-05-05 (×2): 50 ug via INTRAVENOUS

## 2020-05-05 MED ORDER — OXYCODONE HCL 5 MG PO TABS
5.0000 mg | ORAL_TABLET | Freq: Once | ORAL | Status: AC | PRN
  Administered 2020-05-05: 5 mg via ORAL

## 2020-05-05 MED ORDER — ACETAMINOPHEN 10 MG/ML IV SOLN
INTRAVENOUS | Status: DC | PRN
  Administered 2020-05-05: 1000 mg via INTRAVENOUS

## 2020-05-05 MED ORDER — LIDOCAINE HCL (CARDIAC) PF 100 MG/5ML IV SOSY
PREFILLED_SYRINGE | INTRAVENOUS | Status: DC | PRN
  Administered 2020-05-05: 60 mg via INTRAVENOUS

## 2020-05-05 MED ORDER — DEXAMETHASONE SODIUM PHOSPHATE 10 MG/ML IJ SOLN
INTRAMUSCULAR | Status: DC | PRN
  Administered 2020-05-05: 6 mg via INTRAVENOUS

## 2020-05-05 MED ORDER — GLYCOPYRROLATE 0.2 MG/ML IJ SOLN
INTRAMUSCULAR | Status: DC | PRN
  Administered 2020-05-05: .2 mg via INTRAVENOUS

## 2020-05-05 MED ORDER — ACETAMINOPHEN 10 MG/ML IV SOLN
INTRAVENOUS | Status: AC
  Filled 2020-05-05: qty 100

## 2020-05-05 MED ORDER — DEXAMETHASONE SODIUM PHOSPHATE 10 MG/ML IJ SOLN
INTRAMUSCULAR | Status: AC
  Filled 2020-05-05: qty 1

## 2020-05-05 MED ORDER — ONDANSETRON HCL 4 MG/2ML IJ SOLN
INTRAMUSCULAR | Status: DC | PRN
  Administered 2020-05-05: 4 mg via INTRAVENOUS

## 2020-05-05 MED ORDER — OXYCODONE HCL 5 MG/5ML PO SOLN: 5.0000 mg | Freq: Once | ORAL | Status: AC | PRN

## 2020-05-05 MED ORDER — CELECOXIB 200 MG PO CAPS
ORAL_CAPSULE | ORAL | Status: AC
  Administered 2020-05-05: 400 mg via ORAL
  Filled 2020-05-05: qty 2

## 2020-05-05 MED ORDER — BUPIVACAINE-EPINEPHRINE 0.25% -1:200000 IJ SOLN
INTRAMUSCULAR | Status: DC | PRN
  Administered 2020-05-05: 5 mL
  Administered 2020-05-05: 25 mL

## 2020-05-05 MED ORDER — CHLORHEXIDINE GLUCONATE 0.12 % MT SOLN
OROMUCOSAL | Status: AC
  Administered 2020-05-05: 15 mL via OROMUCOSAL
  Filled 2020-05-05: qty 15

## 2020-05-05 MED ORDER — FAMOTIDINE 20 MG PO TABS
ORAL_TABLET | ORAL | Status: AC
  Administered 2020-05-05: 20 mg via ORAL
  Filled 2020-05-05: qty 1

## 2020-05-05 MED ORDER — GLYCOPYRROLATE 0.2 MG/ML IJ SOLN
INTRAMUSCULAR | Status: AC
  Filled 2020-05-05: qty 1

## 2020-05-05 MED ORDER — MORPHINE SULFATE 4 MG/ML IJ SOLN
INTRAMUSCULAR | Status: DC | PRN
  Administered 2020-05-05: 4 mg via INTRAVENOUS

## 2020-05-05 MED ORDER — OXYCODONE HCL 5 MG PO TABS
ORAL_TABLET | ORAL | Status: AC
  Filled 2020-05-05: qty 1

## 2020-05-05 MED ORDER — FENTANYL CITRATE (PF) 100 MCG/2ML IJ SOLN
25.0000 ug | INTRAMUSCULAR | Status: DC | PRN
Start: 2020-05-05 — End: 2020-05-06
  Administered 2020-05-05 (×2): 25 ug via INTRAVENOUS

## 2020-05-05 MED ORDER — SODIUM CHLORIDE 0.9 % IV SOLN: INTRAVENOUS | Status: DC

## 2020-05-05 MED ORDER — METOCLOPRAMIDE HCL 10 MG PO TABS: 5.0000 mg | ORAL_TABLET | Freq: Three times a day (TID) | ORAL | Status: DC | PRN

## 2020-05-05 MED ORDER — METOCLOPRAMIDE HCL 5 MG/ML IJ SOLN: 5.0000 mg | Freq: Three times a day (TID) | INTRAMUSCULAR | Status: DC | PRN

## 2020-05-05 MED ORDER — BUPIVACAINE-EPINEPHRINE (PF) 0.25% -1:200000 IJ SOLN
INTRAMUSCULAR | Status: AC
  Filled 2020-05-05: qty 30

## 2020-05-05 MED ORDER — ACETAMINOPHEN 10 MG/ML IV SOLN: 1000.0000 mg | Freq: Once | INTRAVENOUS | Status: DC | PRN

## 2020-05-05 MED ORDER — PROPOFOL 10 MG/ML IV BOLUS
INTRAVENOUS | Status: DC | PRN
  Administered 2020-05-05: 40 mg via INTRAVENOUS
  Administered 2020-05-05: 10 mg via INTRAVENOUS
  Administered 2020-05-05: 110 mg via INTRAVENOUS
  Administered 2020-05-05: 40 mg via INTRAVENOUS

## 2020-05-05 SURGICAL SUPPLY — 32 items
ADAPTER IRRIG TUBE 2 SPIKE SOL (ADAPTER) ×4 IMPLANT
ADPR TBG 2 SPK PMP STRL ASCP (ADAPTER) ×2
BLADE SHAVER 4.5 DBL SERAT CV (CUTTER) IMPLANT
COVER WAND RF STERILE (DRAPES) ×2 IMPLANT
CUFF TOURN SGL QUICK 24 (TOURNIQUET CUFF) ×2
CUFF TRNQT CYL 24X4X16.5-23 (TOURNIQUET CUFF) IMPLANT
DRAPE ARTHRO LIMB 89X125 STRL (DRAPES) ×2 IMPLANT
DRSG DERMACEA 8X12 NADH (GAUZE/BANDAGES/DRESSINGS) ×2 IMPLANT
DURAPREP 26ML APPLICATOR (WOUND CARE) ×4 IMPLANT
GAUZE SPONGE 4X4 12PLY STRL (GAUZE/BANDAGES/DRESSINGS) ×2 IMPLANT
GLOVE INDICATOR 8.0 STRL GRN (GLOVE) ×2 IMPLANT
GLOVE SURG ENC TEXT LTX SZ7.5 (GLOVE) ×2 IMPLANT
GOWN STRL REUS W/ TWL LRG LVL3 (GOWN DISPOSABLE) ×2 IMPLANT
GOWN STRL REUS W/TWL LRG LVL3 (GOWN DISPOSABLE) ×4
IV LACTATED RINGER IRRG 3000ML (IV SOLUTION) ×12
IV LR IRRIG 3000ML ARTHROMATIC (IV SOLUTION) ×6 IMPLANT
KIT TURNOVER KIT A (KITS) ×2 IMPLANT
MANIFOLD NEPTUNE II (INSTRUMENTS) ×4 IMPLANT
PACK ARTHROSCOPY KNEE (MISCELLANEOUS) ×2 IMPLANT
PAD CAST 4YDX4 CTTN HI CHSV (CAST SUPPLIES) IMPLANT
PADDING CAST COTTON 4X4 STRL (CAST SUPPLIES) ×2
SET TUBE SUCT SHAVER OUTFL 24K (TUBING) ×2 IMPLANT
SET TUBE TIP INTRA-ARTICULAR (MISCELLANEOUS) ×2 IMPLANT
SOL PREP PVP 2OZ (MISCELLANEOUS) ×2
SOLUTION PREP PVP 2OZ (MISCELLANEOUS) ×1 IMPLANT
STOCKINETTE BIAS CUT 6 980064 (GAUZE/BANDAGES/DRESSINGS) ×1 IMPLANT
SUT ETHILON 3-0 FS-10 30 BLK (SUTURE) ×2
SUTURE EHLN 3-0 FS-10 30 BLK (SUTURE) ×1 IMPLANT
TUBING ARTHRO INFLOW-ONLY STRL (TUBING) ×2 IMPLANT
WAND HAND CNTRL MULTIVAC 50 (MISCELLANEOUS) ×2 IMPLANT
WAND HAND CNTRL MULTIVAC 90 (MISCELLANEOUS) IMPLANT
WRAP KNEE W/COLD PACKS 25.5X14 (SOFTGOODS) ×2 IMPLANT

## 2020-05-05 NOTE — Transfer of Care (Signed)
Immediate Anesthesia Transfer of Care Note  Patient: Meghan Myers  Procedure(s) Performed: ARTHROSCOPY KNEE (Left Knee)  Patient Location: PACU  Anesthesia Type:General  Level of Consciousness: awake, alert  and oriented  Airway & Oxygen Therapy: Patient connected to face mask oxygen  Post-op Assessment: Post -op Vital signs reviewed and stable  Post vital signs: stable  Last Vitals:  Vitals Value Taken Time  BP 174/68 05/05/20 1736  Temp    Pulse 83 05/05/20 1737  Resp 21 05/05/20 1737  SpO2 100 % 05/05/20 1737  Vitals shown include unvalidated device data.  Last Pain:  Vitals:   05/05/20 1411  TempSrc: Oral  PainSc: 4          Complications: No complications documented.

## 2020-05-05 NOTE — Anesthesia Preprocedure Evaluation (Signed)
Anesthesia Evaluation  Patient identified by MRN, date of birth, ID band Patient awake    Reviewed: Allergy & Precautions, NPO status , Patient's Chart, lab work & pertinent test results  History of Anesthesia Complications Negative for: history of anesthetic complications  Airway Mallampati: II  TM Distance: >3 FB Neck ROM: Full    Dental no notable dental hx. (+) Teeth Intact   Pulmonary neg pulmonary ROS, neg sleep apnea, neg COPD, Patient abstained from smoking.Not current smoker, former smoker,    Pulmonary exam normal breath sounds clear to auscultation       Cardiovascular Exercise Tolerance: Good METShypertension, (-) CAD and (-) Past MI (-) dysrhythmias  Rhythm:Regular Rate:Normal - Systolic murmurs    Neuro/Psych PSYCHIATRIC DISORDERS Depression negative neurological ROS     GI/Hepatic neg GERD  ,(+)     (-) substance abuse  ,   Endo/Other  neg diabetes  Renal/GU negative Renal ROS     Musculoskeletal  (+) Arthritis , Osteoarthritis,    Abdominal   Peds  Hematology   Anesthesia Other Findings Past Medical History: 10/2016: Cancer ([place])     Comment:  kidney ca No date: Depression No date: History of kidney stones No date: HLD (hyperlipidemia) No date: Hypertension No date: Liver disease No date: Osteoarthritis No date: Rheumatoid arthritis ([place]) No date: Shingles 01/27/2015: Squamous cell carcinoma of skin     Comment:  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   Reproductive/Obstetrics                            Anesthesia Physical Anesthesia Plan  ASA: II  Anesthesia Plan: General   Post-op Pain Management:    Induction: Intravenous  PONV Risk Score and Plan: 3 and Ondansetron, Dexamethasone and Treatment may vary due to age or medical condition  Airway Management Planned: LMA  Additional Equipment: None  Intra-op Plan:   Post-operative Plan:  Extubation in OR  Informed Consent: I have reviewed the patients History and Physical, chart, labs and discussed the procedure including the risks, benefits and alternatives for the proposed anesthesia with the patient or authorized representative who has indicated his/her understanding and acceptance.     Dental advisory given  Plan Discussed with: CRNA and Surgeon  Anesthesia Plan Comments: (Discussed risks of anesthesia with patient, including PONV, sore throat, lip/dental damage. Rare risks discussed as well, such as cardiorespiratory and neurological sequelae. Patient understands.)        Anesthesia Quick Evaluation

## 2020-05-05 NOTE — Anesthesia Procedure Notes (Signed)
Procedure Name: LMA Insertion Date/Time: 05/05/2020 3:49 PM Performed by: Aline Brochure, CRNA Pre-anesthesia Checklist: Patient identified, Emergency Drugs available, Suction available and Patient being monitored Patient Re-evaluated:Patient Re-evaluated prior to induction Oxygen Delivery Method: Circle system utilized Preoxygenation: Pre-oxygenation with 100% oxygen Induction Type: IV induction Ventilation: Mask ventilation without difficulty LMA: LMA inserted LMA Size: 3.5 Number of attempts: 1 Placement Confirmation: positive ETCO2 and breath sounds checked- equal and bilateral Tube secured with: Tape Dental Injury: Teeth and Oropharynx as per pre-operative assessment

## 2020-05-05 NOTE — H&P (Signed)
The patient has been re-examined, and the chart reviewed, and there have been no interval changes to the documented history and physical.    The risks, benefits, and alternatives have been discussed at length. The patient expressed understanding of the risks benefits and agreed with plans for surgical intervention.  Michaela Broski P. Gilda Abboud, Jr. M.D.    

## 2020-05-05 NOTE — Op Note (Signed)
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   ASSISTANT: none  ANESTHESIA: general  ESTIMATED BLOOD LOSS: Minimal  FLUIDS REPLACED: 1000 mL of crystalloid  TOURNIQUET TIME: Not used  INDICATIONS FOR SURGERY: Meghan Myers is a 70 y.o. year old female who has been seen for complaints of left knee pain. MRI demonstrated findings consistent with meniscal pathology. After discussion of the risks and benefits of surgical intervention, the patient expressed understanding of the risks benefits and agree with plans for left knee arthroscopy.   PROCEDURE IN DETAIL: The patient was brought into the operating room and, after adequate general anesthesia was achieved, a tourniquet was applied to the left thigh and the leg was placed in the leg holder. All bony prominences were well padded. The patient's left knee was cleaned and prepped with alcohol and Duraprep and draped in the usual sterile fashion. A "timeout" was performed as per usual protocol. The anticipated portal sites were injected with 0.25% Marcaine with epinephrine. An anterolateral incision was made and a cannula was inserted. A moderate effusion was evacuated and the knee was distended with fluid using the pump. The scope was advanced down the medial gutter into the medial compartment. Under visualization with the scope, an anteromedial portal was created and a hooked probe was inserted. The medial meniscus was visualized and probed.  There was a complex tear of the posterior horn of the medial meniscus.  The  tear was debrided using meniscal punches and a 4.5 mm incisor shaver.  Final contouring was performed using a 50 degree ArthroCare wand.  The remaining rim of meniscus was visualized and probed and felt to be stable.  The articular cartilage was visualized.  There was an area of grade IV chondromalacia involving the posteromedial aspect of the medial tibial plateau and along the medial most aspect of the medial femoral condyle.  The margins were contoured using the ArthroCare wand.  The scope was then advanced into the intercondylar notch. The anterior cruciate ligament was visualized and probed and felt to be intact. The scope was removed from the lateral portal and reinserted via the anteromedial portal to better visualize the lateral compartment. The lateral meniscus was visualized and probed.  There was mild fraying along the inner aspect but no gross tear or instability.  The articular cartilage of the lateral compartment was visualized and noted to be in reasonably good condition.  Finally, the scope was advanced so as to visualize the patellofemoral articulation. Good patellar tracking was appreciated.  8 4 chondromalacia was noted to the lateral facet of the patella as well as along the lateral aspect of the intercondylar sulcus.  The areas were debrided and contoured using the ArthroCare wand.  The knee was irrigated with copius amounts of fluid and suctioned dry. The anterolateral portal was re-approximated with #3-0 nylon. A combination of 0.25% Marcaine with epinephrine and 4 mg of Morphine were injected via the scope. The scope was removed and the anteromedial portal was re-approximated with #3-0 nylon. A sterile dressing was applied followed by application of an ice  wrap.  The patient tolerated the procedure well and was transported to the PACU in stable condition.  Meghan Myers P. Holley Bouche., M.D.

## 2020-05-05 NOTE — Discharge Instructions (Signed)
Outpatient Surgery, Adult An outpatient surgery is a procedure that does not require an overnight stay at a hospital or clinic. A person having an outpatient surgery can go home hours after the surgery is complete. Tell a health care provider about:  Any allergies you have.  All medicines you are taking, including vitamins, supplements, herbs, eye drops, creams, and over-the-counter medicines.  Any problems you or family members have had with anesthetic medicines.  Any blood disorders you have.  Any surgeries you have had.  Any medical conditions you have.  Any use of tobacco products, alcohol, or drugs, including marijuana.  Whether you are pregnant or may be pregnant. What are the risks? The risks and complications of surgery depend on the specific procedure. Common risks and complications include:  Infection.  Bleeding.  Blood clots in the legs or lungs.  Allergic reactions to medicines or dyes.  Damage to nearby structures or organs.  Temporary increase in pain.  Failure to fix the problem that the surgery was meant to fix. What happens before the procedure? Staying hydrated Follow instructions from your health care provider about hydration, which may include:  Up to 2 hours before the procedure - you may continue to drink clear liquids, such as water, clear fruit juice, black coffee, and plain tea.   Eating and drinking restrictions Follow instructions from your health care provider about eating and drinking, which may include:  8 hours before the procedure - stop eating heavy meals or foods, such as meat, fried foods, or fatty foods.  6 hours before the procedure - stop eating light meals or foods, such as toast or cereal.  6 hours before the procedure - stop drinking milk or drinks that contain milk.  2 hours before the procedure - stop drinking clear liquids. Medicines  Ask your health care provider about: ? Changing or stopping your regular medicines.  This is especially important if you are taking diabetes medicines or blood thinners. ? Taking medicines such as aspirin and ibuprofen. These medicines can thin your blood. Do not take these medicines unless your health care provider tells you to take them. ? Taking over-the-counter medicines, vitamins, herbs, and supplements.  You may be asked to take medicines that help you have a bowel movement (laxatives) or to take antibiotics to help prevent infection.   General instructions  Do not use any products that contain nicotine or tobacco for at least 4 weeks before the procedure. These products include cigarettes, e-cigarettes, and chewing tobacco. If you need help quitting, ask your health care provider.  If told by your health care provider, bring your sleep apnea device with you on the day of your surgery.  Plan to have a responsible adult take you home from the hospital or clinic.  Plan to have a responsible adult care for you for the time you are told after you leave the hospital or clinic. This is important.  Ask your health care provider: ? How your surgery site will be marked. ? What steps will be taken to help prevent infection. These may include:  Removing hair at the surgery site.  Washing skin with a germ-killing soap.  Receiving an antibiotic medicine.  Call your health care provider if you develop an illness or a problem that may prevent you from safely having your procedure.  You may have an exam or testing.   What happens during the procedure?  An IV will be inserted into one of your veins.  You may be  given one or more of the following: ? A medicine to help you relax (sedative). ? A medicine to numb the area (local anesthetic). ? A medicine to make you fall asleep (general anesthetic). ? A medicine that is injected into your spine to numb the area below and slightly above the injection site (spinal anesthetic). ? A medicine that is injected into an area of your  body to numb everything below the injection site (regional anesthetic).  If any incisions are made during the procedure, they will be closed with stitches (sutures), staples, skin glue, or adhesive strips.  A bandage (dressing) may be applied over any sutures or other closures. The specifics of the procedure will depend on the type of procedure that you are having. The procedure may also vary among health care providers and hospitals. What happens after the procedure?  Your blood pressure, heart rate, breathing rate, and blood oxygen level will be monitored until you leave the hospital or clinic.  Do not drive or operate machinery until your health care provider says that it is safe.  If there are no complications, you will be allowed to go home with a responsible adult when you are awake, stable, and taking fluids well.  The surgical site will feel tender.  You may feel nauseous and have some swelling, bruising, and numbness around the surgical site.   Summary  An outpatient surgery is a procedure that does not require an overnight stay at a hospital or clinic. A person having an outpatient surgery can go home hours after the surgery is complete.  Follow instructions from your health care provider about eating and drinking before your surgery.  Ask your health care provider if you need to take any new medicines before surgery or if you need to change or stop your regular medicines.  Do not use any products that contain nicotine or tobacco for at least 4 weeks before the procedure.  Plan to have a responsible adult care for you for the time you are told after you leave the hospital or clinic. This is important. This information is not intended to replace advice given to you by your health care provider. Make sure you discuss any questions you have with your health care provider. Document Revised: 06/15/2019 Document Reviewed: 12/07/2018 Elsevier Patient Education  2021 [employer].  Instructions after Knee Arthroscopy    Laurice Record. Holley Bouche., M.D.     Dept. of [facility]  [place],   [address]   Phone: [phone]   Fax: [phone]   DIET: . Drink plenty of non-alcoholic fluids & begin a light diet. Marland Kitchen Resume your normal diet the day after surgery.  ACTIVITY:  . You may use crutches or a walker with weight-bearing as tolerated, unless instructed otherwise. . You may wean yourself off of the walker or crutches as tolerated.  . Begin doing gentle exercises. Exercising will reduce the pain and swelling, increase motion, and prevent muscle weakness.   . Avoid strenuous activities or athletics for a minimum of 4-6 weeks after arthroscopic surgery. . Do not drive or operate any equipment until instructed.  WOUND CARE:  . Place one to two pillows under the knee the first day or two when sitting or lying.  . Continue to use the ice packs periodically to reduce pain and swelling. . The small incisions in your knee are closed with nylon stitches. The stitches will be removed in the office. Marland Kitchen  The bulky dressing may be removed on the second day after surgery. DO NOT TOUCH THE STITCHES. Put a Band-Aid over each stitch. Do NOT use any ointments or creams on the incisions.  . You may bathe or shower after the stitches are removed at the first office visit following surgery.  MEDICATIONS: . Dennis Bast may resume your regular medications. . Please take the pain medication as prescribed. . Do not take pain medication on an empty stomach. . Do not drive or drink alcoholic beverages when taking pain medications.  CALL THE OFFICE FOR: . Temperature above 101 degrees . Excessive bleeding or drainage on the dressing. . Excessive swelling, coldness, or paleness of the toes. . Persistent nausea and vomiting.  FOLLOW-UP:  . You should have an appointment to return to the office in 7-10 days after surgery.        [facility] Department Directory         [email or web address]       [email or web address]          Cardiology  Appointments: [place] [phone]  Endocrinology  Appointments: [place] [phone] [place] [phone]  Gastroenterology  Appointments: [place] [phone] [place] [phone]        General Surgery   Appointments: [facility]  Internal Medicine/Family Medicine  Appointments: [facility] [place] - [phone] [place] [phone]  Metabolic and Caroline Loss Surgery  Appointments: [facility]        Neurology  Appointments: [place] [phone] [place] - [phone]  Neurosurgery  Appointments: [place]  Obstetrics & Gynecology  Appointments: [place] [phone] [place] - [phone]        Pediatrics  Appointments: [place] [phone] [place] - [phone]  Physiatry  Appointments: [place] [phone]  Physical Therapy  Appointments: [place] [phone]        Podiatry  Appointments: [place] [phone] [place] - [phone]  Pulmonology  Appointments: [place]  Rheumatology  Appointments: [place] [phone]        [place] Location: [facility]  [place], [place]  [address]  [place] Location: [facility] [address], [place]  [address]  [place] Location: [facility] [address] [place], [place]  [address]      AMBULATORY SURGERY  DISCHARGE INSTRUCTIONS   1) The drugs that you were given will stay in your system until tomorrow so for the next 24 hours you should not:  A) Drive an automobile B) Make any legal decisions C) Drink any alcoholic beverage   2) You may resume regular meals tomorrow.  Today it is better to start with liquids and gradually work up to  solid foods.  You may eat anything you prefer, but it is better to start with liquids, then soup and crackers, and gradually work up to solid foods.   3) Please notify your doctor immediately if you have any unusual bleeding, trouble breathing, redness and pain at the surgery site, drainage, fever, or pain not relieved by medication.    4) Additional Instructions:        Please contact your physician with any problems or Same Day Surgery at [phone], Monday through Friday 6 am to 4 pm, or [place] at [facility] number at [phone].

## 2020-05-06 NOTE — Anesthesia Postprocedure Evaluation (Signed)
Anesthesia Post Note  Patient: Meghan Myers  Procedure(s) Performed: ARTHROSCOPY KNEE (Left Knee)  Patient location during evaluation: PACU Anesthesia Type: General Level of consciousness: awake and alert and oriented Pain management: pain level controlled Vital Signs Assessment: post-procedure vital signs reviewed and stable Respiratory status: spontaneous breathing Cardiovascular status: blood pressure returned to baseline Anesthetic complications: no   No complications documented.   Last Vitals:  Vitals:   05/05/20 1831 05/05/20 1843  BP: (!) 142/60 123/63  Pulse: 78 82  Resp: 16 17  Temp: 37.1 C 37.1 C  SpO2: 100% 100%    Last Pain:  Vitals:   05/05/20 1843  TempSrc:   PainSc: 4                  Shital Crayton

## 2020-05-07 ENCOUNTER — Encounter: Payer: Self-pay | Admitting: Orthopedic Surgery

## 2020-05-26 ENCOUNTER — Other Ambulatory Visit: Payer: Self-pay | Admitting: Family Medicine

## 2020-05-26 DIAGNOSIS — Z1231 Encounter for screening mammogram for malignant neoplasm of breast: Secondary | ICD-10-CM

## 2020-06-12 ENCOUNTER — Encounter: Payer: Self-pay | Admitting: Orthopedic Surgery

## 2020-06-17 ENCOUNTER — Other Ambulatory Visit: Payer: Self-pay

## 2020-06-17 ENCOUNTER — Ambulatory Visit
Admission: RE | Admit: 2020-06-17 | Discharge: 2020-06-17 | Disposition: A | Discharge status: Home / Self Care | Payer: Medicare Other | Source: Ambulatory Visit | Attending: Family Medicine | Admitting: Family Medicine

## 2020-06-17 DIAGNOSIS — Z1231 Encounter for screening mammogram for malignant neoplasm of breast: Secondary | ICD-10-CM | POA: Insufficient documentation

## 2020-11-08 DIAGNOSIS — M1712 Unilateral primary osteoarthritis, left knee: Secondary | ICD-10-CM | POA: Insufficient documentation
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
## 2020-11-16 NOTE — Discharge Instructions (Signed)
Instructions after Total Knee Replacement   Kalani Baray P. Arnetha Silverthorne, Jr., M.D.     Dept. of Orthopaedics & Sports Medicine  [facility]  [address]  [place], [place]  [address]  Phone: [phone]   Fax: [phone]    DIET: Drink plenty of non-alcoholic fluids. Resume your normal diet. Include foods high in fiber.  ACTIVITY:  You may use crutches or a walker with weight-bearing as tolerated, unless instructed otherwise. You may be weaned off of the walker or crutches by your Physical Therapist.  Do NOT place pillows under the knee. Anything placed under the knee could limit your ability to straighten the knee.   Continue doing gentle exercises. Exercising will reduce the pain and swelling, increase motion, and prevent muscle weakness.   Please continue to use the TED compression stockings for 6 weeks. You may remove the stockings at night, but should reapply them in the morning. Do not drive or operate any equipment until instructed.  WOUND CARE:  Continue to use the PolarCare or ice packs periodically to reduce pain and swelling. You may bathe or shower after the staples are removed at the first office visit following surgery.  MEDICATIONS: You may resume your regular medications. Please take the pain medication as prescribed on the medication. Do not take pain medication on an empty stomach. You have been given a prescription for a blood thinner (Lovenox or Coumadin). Please take the medication as instructed. (NOTE: After completing a 2 week course of Lovenox, take one Enteric-coated aspirin once a day. This along with elevation will help reduce the possibility of phlebitis in your operated leg.) Do not drive or drink alcoholic beverages when taking pain medications.  CALL THE OFFICE FOR: Temperature above 101 degrees Excessive bleeding or drainage on the dressing. Excessive swelling, coldness, or paleness of the toes. Persistent nausea and vomiting.  FOLLOW-UP:  You  should have an appointment to return to the office in 10-14 days after surgery. Arrangements have been made for continuation of Physical Therapy (either home therapy or outpatient therapy).   [facility] Department Directory         [email or web address]       [email or web address]          Cardiology  Appointments: [place] - [phone] [place] - [phone]  Endocrinology  Appointments: [place] - [phone] [place] - [phone]  Gastroenterology  Appointments: [place] - [phone] [place] - [phone]        General Surgery   Appointments: [place] - [phone]  Internal Medicine/Family Medicine  Appointments: [place] - [phone] [place] - [phone] [place] - [phone]  Metabolic and Weigh Loss Surgery  Appointments: [place] - [phone]        Neurology  Appointments: [place] - [phone] [place] - [phone]  Neurosurgery  Appointments: [place] - [phone]  Obstetrics & Gynecology  Appointments: [place] - [phone] [place] - [phone]        Pediatrics  Appointments: [place] - [phone] [place] - [phone]  Physiatry  Appointments: [place] -[phone]  Physical Therapy  Appointments: [place] - [phone] [place] - [phone]        Podiatry  Appointments: [place] - [phone] [place] - [phone]  Pulmonology  Appointments: [place] - [phone]  Rheumatology  Appointments: [place] - [phone]         Location: [facility]  [address] , [place]  [address]  [place] Location: [facility] [address] [place], [place]  [address]  [place] Location: [facility] [address] [place], [place]  [address]    

## 2020-11-19 ENCOUNTER — Encounter
Admission: RE | Admit: 2020-11-19 | Discharge: 2020-11-19 | Disposition: A | Discharge status: Home / Self Care | Payer: Medicare Other | Source: Ambulatory Visit | Attending: Orthopedic Surgery | Admitting: Orthopedic Surgery

## 2020-11-19 ENCOUNTER — Other Ambulatory Visit: Payer: Self-pay

## 2020-11-19 DIAGNOSIS — Z01818 Encounter for other preprocedural examination: Secondary | ICD-10-CM | POA: Insufficient documentation

## 2020-11-19 HISTORY — DX: Chronic kidney disease, unspecified: N18.9

## 2020-11-19 LAB — COMPREHENSIVE METABOLIC PANEL
ALT: 32 U/L (ref 0–44)
AST: 33 U/L (ref 15–41)
Albumin: 4.1 g/dL (ref 3.5–5.0)
Alkaline Phosphatase: 84 U/L (ref 38–126)
Anion gap: 11 (ref 5–15)
BUN: 24 mg/dL — ABNORMAL HIGH (ref 8–23)
CO2: 26 mmol/L (ref 22–32)
Calcium: 9.4 mg/dL (ref 8.9–10.3)
Chloride: 103 mmol/L (ref 98–111)
Creatinine, Ser: 0.67 mg/dL (ref 0.44–1.00)
GFR, Estimated: >60 mL/min (ref >60)
Glucose, Bld: 105 mg/dL — ABNORMAL HIGH (ref 70–99)
Potassium: 3.6 mmol/L (ref 3.5–5.1)
Sodium: 140 mmol/L (ref 135–145)
Total Bilirubin: 0.9 mg/dL (ref 0.3–1.2)
Total Protein: 6.9 g/dL (ref 6.5–8.1)

## 2020-11-19 LAB — PROTIME-INR
INR: 1 (ref 0.8–1.2)
Prothrombin Time: 13.6 seconds (ref 11.4–15.2)

## 2020-11-19 LAB — SURGICAL PCR SCREEN
MRSA, PCR: NEGATIVE
Staphylococcus aureus: NEGATIVE

## 2020-11-19 LAB — URINALYSIS, ROUTINE W REFLEX MICROSCOPIC
Bacteria, UA: NONE SEEN
Glucose, UA: NEGATIVE mg/dL
Hgb urine dipstick: NEGATIVE
Ketones, ur: 5 mg/dL — AB
Leukocytes,Ua: NEGATIVE
Nitrite: NEGATIVE
Protein, ur: 30 mg/dL — AB
Specific Gravity, Urine: 1.036 — ABNORMAL HIGH (ref 1.005–1.030)
pH: 5 (ref 5.0–8.0)

## 2020-11-19 LAB — CBC
HCT: 40.2 % (ref 36.0–46.0)
Hemoglobin: 13.5 g/dL (ref 12.0–15.0)
MCH: 30.7 pg (ref 26.0–34.0)
MCHC: 33.6 g/dL (ref 30.0–36.0)
MCV: 91.4 fL (ref 80.0–100.0)
Platelets: 284 10*3/uL (ref 150–400)
RBC: 4.4 MIL/uL (ref 3.87–5.11)
RDW: 12.9 % (ref 11.5–15.5)
WBC: 5.8 10*3/uL (ref 4.0–10.5)
nRBC: 0 % (ref 0.0–0.2)

## 2020-11-19 LAB — TYPE AND SCREEN
ABO/RH(D): O POS
Antibody Screen: NEGATIVE

## 2020-11-19 LAB — APTT: aPTT: 31 seconds (ref 24–36)

## 2020-11-19 LAB — C-REACTIVE PROTEIN: CRP: 0.5 mg/dL (ref <1.0)

## 2020-11-19 LAB — SEDIMENTATION RATE: Sed Rate: 5 mm/hr (ref 0–30)

## 2020-11-19 NOTE — Patient Instructions (Signed)
Your procedure is scheduled on: Wednesday November 26, 2020. Report to Day Surgery inside [facility] 2nd floor stop by admissions desk first before getting on elevator. To find out your arrival time please call [phone] between 1PM - 3PM on Tuesday November 25, 2020.  Remember: Instructions that are not followed completely may result in serious medical risk,  up to and including death, or upon the discretion of your surgeon and anesthesiologist your  surgery may need to be rescheduled.     _X__ 1. Do not eat food after midnight the night before your procedure.                 No chewing gum or hard candies. You may drink clear liquids up to 2 hours                 before you are scheduled to arrive for your surgery- DO not drink clear                 liquids within 2 hours of the start of your surgery.                 Clear Liquids include:  water, apple juice without pulp, clear Gatorade, G2 or                  Gatorade Zero (avoid Red/Purple/Blue), Black Coffee or Tea (Do not add                 anything to coffee or tea).  __X__2.   Complete the "Ensure Clear Pre-surgery Clear Carbohydrate Drink" provided to you, 2 hours before arrival. **If you are diabetic you will be provided with an alternative drink, Gatorade Zero or G2.  __X__3.  On the morning of surgery brush your teeth with toothpaste and water, you                may rinse your mouth with mouthwash if you wish.  Do not swallow any toothpaste of mouthwash.     _X__ 4.  No Alcohol for 24 hours before or after surgery.   _X__ 5.  Do Not Smoke or use e-cigarettes For 24 Hours Prior to Your Surgery.                 Do not use any chewable tobacco products for at least 6 hours prior to                 Surgery.  _X__  6.  Do not use any recreational drugs (marijuana, cocaine, heroin, ecstasy, MDMA or other)                For at least one week prior to your surgery.  Combination of these drugs with  anesthesia                May have life threatening results.  __X__  7.  Notify your doctor if there is any change in your medical condition      (cold, fever, infections).     Do not wear jewelry, make-up, hairpins, clips or nail polish. Do not wear lotions, powders, or perfumes. You may wear deodorant. Do not shave 48 hours prior to surgery. Men may shave face and neck. Do not bring valuables to the hospital.    [facility] is not responsible for any belongings or valuables.  Contacts, dentures or bridgework may not be worn into surgery. Leave your suitcase in the car. After  surgery it may be brought to your room. For patients admitted to the hospital, discharge time is determined by your treatment team.   Patients discharged the day of surgery will not be allowed to drive home.   Make arrangements for someone to be with you for the first 24 hours of your Same Day Discharge.    Please read over the following fact sheets that you were given:   Total Joint Packet    __X__ Take these medicines the morning of surgery with A SIP OF WATER:    1. amLODipine (NORVASC) 5 MG  2. amphetamine-dextroamphetamine (ADDERALL XR) 25 MG   3. sertraline (ZOLOFT) 100 MG   4.  5.  6.  ____ Fleet Enema (as directed)   __X__ Use CHG Soap (or wipes) as directed  ____ Use Benzoyl Peroxide Gel as instructed  ____ Use inhalers on the day of surgery  ____ Stop metformin 2 days prior to surgery    ____ Take 1/2 of usual insulin dose the night before surgery. No insulin the morning          of surgery.   ____ Call your PCP, cardiologist, or Pulmonologist if taking Coumadin/Plavix/aspirin and ask when to stop before your surgery.   __X__ One Week prior to surgery- Stop Anti-inflammatories such as Ibuprofen, Aleve, Advil, Motrin, meloxicam (MOBIC), diclofenac, etodolac, ketorolac, Toradol, Daypro, piroxicam, Goody's or BC powders. OK TO USE TYLENOL IF NEEDED  __X__ Stop supplements until after  surgery. Omega-3 Fatty Acids (FISH OIL), Multiple Vitamin (MULTIVITAMIN WITH MINERALS)  ____ Bring C-Pap to the hospital.    If you have any questions regarding your pre-procedure instructions,  Please call Pre-admit Testing at [phone]

## 2020-11-20 LAB — URINE CULTURE: Culture: NO GROWTH

## 2020-11-24 ENCOUNTER — Other Ambulatory Visit: Payer: Self-pay

## 2020-11-24 ENCOUNTER — Other Ambulatory Visit
Admission: RE | Admit: 2020-11-24 | Discharge: 2020-11-24 | Disposition: A | Discharge status: Home / Self Care | Payer: Medicare Other | Source: Ambulatory Visit | Attending: Orthopedic Surgery | Admitting: Orthopedic Surgery

## 2020-11-24 DIAGNOSIS — Z20822 Contact with and (suspected) exposure to covid-19: Secondary | ICD-10-CM | POA: Diagnosis not present

## 2020-11-24 DIAGNOSIS — Z01812 Encounter for preprocedural laboratory examination: Secondary | ICD-10-CM | POA: Diagnosis present

## 2020-11-24 LAB — SARS CORONAVIRUS 2 (TAT 6-24 HRS): SARS Coronavirus 2: NEGATIVE

## 2020-11-25 MED ORDER — CHLORHEXIDINE GLUCONATE 0.12 % MT SOLN: 15.0000 mL | Freq: Once | OROMUCOSAL | Status: AC

## 2020-11-25 MED ORDER — DEXAMETHASONE SODIUM PHOSPHATE 10 MG/ML IJ SOLN: 8.0000 mg | Freq: Once | INTRAMUSCULAR | Status: DC

## 2020-11-25 MED ORDER — ORAL CARE MOUTH RINSE
15.0000 mL | Freq: Once | OROMUCOSAL | Status: AC
Start: 2020-11-25 — End: 2020-11-26

## 2020-11-25 MED ORDER — CHLORHEXIDINE GLUCONATE 4 % EX LIQD: 60.0000 mL | Freq: Once | CUTANEOUS | Status: DC

## 2020-11-25 MED ORDER — CEFAZOLIN SODIUM-DEXTROSE 2-4 GM/100ML-% IV SOLN: 2.0000 g | INTRAVENOUS | Status: DC

## 2020-11-25 MED ORDER — FAMOTIDINE 20 MG PO TABS: 20.0000 mg | ORAL_TABLET | Freq: Once | ORAL | Status: AC

## 2020-11-25 MED ORDER — TRANEXAMIC ACID-NACL 1000-0.7 MG/100ML-% IV SOLN: 1000.0000 mg | INTRAVENOUS | Status: DC

## 2020-11-25 MED ORDER — CELECOXIB 200 MG PO CAPS: 400.0000 mg | ORAL_CAPSULE | Freq: Once | ORAL | Status: AC

## 2020-11-25 MED ORDER — GABAPENTIN 300 MG PO CAPS: 300.0000 mg | ORAL_CAPSULE | Freq: Once | ORAL | Status: DC

## 2020-11-25 MED ORDER — SODIUM CHLORIDE 0.9 % IV SOLN: INTRAVENOUS | Status: DC

## 2020-11-26 ENCOUNTER — Other Ambulatory Visit: Payer: Self-pay

## 2020-11-26 ENCOUNTER — Ambulatory Visit: Payer: Medicare Other | Admitting: Anesthesiology

## 2020-11-26 ENCOUNTER — Encounter
Admission: RE | Disposition: A | Discharge status: Home / Self Care | Payer: Self-pay | Source: Home / Self Care | Attending: Orthopedic Surgery

## 2020-11-26 ENCOUNTER — Observation Stay: Payer: Medicare Other

## 2020-11-26 ENCOUNTER — Observation Stay
Admission: RE | Admit: 2020-11-26 | Discharge: 2020-11-27 | Disposition: A | Discharge status: Home / Self Care | Payer: Medicare Other | Attending: Orthopedic Surgery | Admitting: Orthopedic Surgery

## 2020-11-26 ENCOUNTER — Encounter: Payer: Self-pay | Admitting: Orthopedic Surgery

## 2020-11-26 DIAGNOSIS — Z79899 Other long term (current) drug therapy: Secondary | ICD-10-CM | POA: Diagnosis not present

## 2020-11-26 DIAGNOSIS — Z85828 Personal history of other malignant neoplasm of skin: Secondary | ICD-10-CM | POA: Insufficient documentation

## 2020-11-26 DIAGNOSIS — Z85528 Personal history of other malignant neoplasm of kidney: Secondary | ICD-10-CM | POA: Insufficient documentation

## 2020-11-26 DIAGNOSIS — Z87891 Personal history of nicotine dependence: Secondary | ICD-10-CM | POA: Diagnosis not present

## 2020-11-26 DIAGNOSIS — Z96659 Presence of unspecified artificial knee joint: Secondary | ICD-10-CM

## 2020-11-26 DIAGNOSIS — I129 Hypertensive chronic kidney disease with stage 1 through stage 4 chronic kidney disease, or unspecified chronic kidney disease: Secondary | ICD-10-CM | POA: Diagnosis not present

## 2020-11-26 DIAGNOSIS — N189 Chronic kidney disease, unspecified: Secondary | ICD-10-CM | POA: Diagnosis not present

## 2020-11-26 DIAGNOSIS — M1712 Unilateral primary osteoarthritis, left knee: Secondary | ICD-10-CM | POA: Diagnosis present

## 2020-11-26 HISTORY — PX: KNEE ARTHROPLASTY: SHX992

## 2020-11-26 LAB — ABO/RH: ABO/RH(D): O POS

## 2020-11-26 SURGERY — ARTHROPLASTY, KNEE, TOTAL, USING IMAGELESS COMPUTER-ASSISTED NAVIGATION
Anesthesia: Spinal | Site: Knee | Laterality: Left

## 2020-11-26 MED ORDER — BUPIVACAINE HCL (PF) 0.5 % IJ SOLN
INTRAMUSCULAR | Status: DC | PRN
  Administered 2020-11-26: 3 mL

## 2020-11-26 MED ORDER — ALUM & MAG HYDROXIDE-SIMETH 200-200-20 MG/5ML PO SUSP: 30.0000 mL | ORAL | Status: DC | PRN

## 2020-11-26 MED ORDER — ACETAMINOPHEN 325 MG PO TABS: 325.0000 mg | ORAL_TABLET | Freq: Four times a day (QID) | ORAL | Status: DC | PRN

## 2020-11-26 MED ORDER — HYDROMORPHONE HCL 1 MG/ML IJ SOLN: 0.5000 mg | INTRAMUSCULAR | Status: DC | PRN

## 2020-11-26 MED ORDER — SURGIPHOR WOUND IRRIGATION SYSTEM - OPTIME
TOPICAL | Status: DC | PRN
  Administered 2020-11-26: 400 mL via TOPICAL

## 2020-11-26 MED ORDER — SERTRALINE HCL 50 MG PO TABS
150.0000 mg | ORAL_TABLET | Freq: Every day | ORAL | Status: DC
  Administered 2020-11-27: 150 mg via ORAL
  Filled 2020-11-26: qty 3

## 2020-11-26 MED ORDER — ACETAMINOPHEN 500 MG PO TABS
1000.0000 mg | ORAL_TABLET | Freq: Four times a day (QID) | ORAL | Status: AC
Start: 2020-11-26 — End: 2020-11-27
  Administered 2020-11-26 – 2020-11-27 (×4): 1000 mg via ORAL
  Filled 2020-11-26 (×4): qty 2

## 2020-11-26 MED ORDER — CELECOXIB 200 MG PO CAPS
200.0000 mg | ORAL_CAPSULE | Freq: Two times a day (BID) | ORAL | Status: DC
  Administered 2020-11-26 – 2020-11-27 (×3): 200 mg via ORAL
  Filled 2020-11-26 (×3): qty 1

## 2020-11-26 MED ORDER — OXYCODONE HCL 5 MG PO TABS
10.0000 mg | ORAL_TABLET | ORAL | Status: DC | PRN
  Administered 2020-11-26 – 2020-11-27 (×2): 10 mg via ORAL
  Filled 2020-11-26 (×3): qty 2

## 2020-11-26 MED ORDER — PROPOFOL 1000 MG/100ML IV EMUL
INTRAVENOUS | Status: AC
  Filled 2020-11-26: qty 100

## 2020-11-26 MED ORDER — FERROUS SULFATE 325 (65 FE) MG PO TABS
325.0000 mg | ORAL_TABLET | Freq: Two times a day (BID) | ORAL | Status: DC
  Administered 2020-11-26 – 2020-11-27 (×2): 325 mg via ORAL
  Filled 2020-11-26 (×2): qty 1

## 2020-11-26 MED ORDER — CHLORHEXIDINE GLUCONATE 0.12 % MT SOLN
OROMUCOSAL | Status: AC
  Administered 2020-11-26: 15 mL via OROMUCOSAL
  Filled 2020-11-26: qty 15

## 2020-11-26 MED ORDER — SODIUM CHLORIDE FLUSH 0.9 % IV SOLN
INTRAVENOUS | Status: AC
  Filled 2020-11-26: qty 40

## 2020-11-26 MED ORDER — NAPHAZOLINE-GLYCERIN 0.012-0.25 % OP SOLN
1.0000 [drp] | Freq: Four times a day (QID) | OPHTHALMIC | Status: DC | PRN
  Filled 2020-11-26: qty 15

## 2020-11-26 MED ORDER — TRAMADOL HCL 50 MG PO TABS
50.0000 mg | ORAL_TABLET | ORAL | Status: DC | PRN
  Administered 2020-11-26: 100 mg via ORAL
  Filled 2020-11-26: qty 2

## 2020-11-26 MED ORDER — LACTATED RINGERS IV SOLN: INTRAVENOUS | Status: DC | PRN

## 2020-11-26 MED ORDER — PHENYLEPHRINE HCL (PRESSORS) 10 MG/ML IV SOLN
INTRAVENOUS | Status: AC
  Filled 2020-11-26: qty 1

## 2020-11-26 MED ORDER — SODIUM CHLORIDE 0.9 % IR SOLN
Status: DC | PRN
  Administered 2020-11-26: 3000 mL

## 2020-11-26 MED ORDER — CEFAZOLIN SODIUM-DEXTROSE 2-3 GM-%(50ML) IV SOLR
INTRAVENOUS | Status: DC | PRN
  Administered 2020-11-26: 2 g via INTRAVENOUS

## 2020-11-26 MED ORDER — CEFAZOLIN SODIUM-DEXTROSE 2-4 GM/100ML-% IV SOLN
INTRAVENOUS | Status: AC
  Filled 2020-11-26: qty 100

## 2020-11-26 MED ORDER — PANTOPRAZOLE SODIUM 40 MG PO TBEC
40.0000 mg | DELAYED_RELEASE_TABLET | Freq: Two times a day (BID) | ORAL | Status: DC
  Administered 2020-11-26 – 2020-11-27 (×3): 40 mg via ORAL
  Filled 2020-11-26 (×3): qty 1

## 2020-11-26 MED ORDER — ATORVASTATIN CALCIUM 20 MG PO TABS
20.0000 mg | ORAL_TABLET | Freq: Every day | ORAL | Status: DC
  Administered 2020-11-26: 20 mg via ORAL
  Filled 2020-11-26: qty 1

## 2020-11-26 MED ORDER — LIDOCAINE HCL (PF) 2 % IJ SOLN
INTRAMUSCULAR | Status: AC
  Filled 2020-11-26: qty 5

## 2020-11-26 MED ORDER — 0.9 % SODIUM CHLORIDE (POUR BTL) OPTIME
TOPICAL | Status: DC | PRN
  Administered 2020-11-26: 60 mL

## 2020-11-26 MED ORDER — OXYCODONE HCL 5 MG PO TABS
5.0000 mg | ORAL_TABLET | ORAL | Status: DC | PRN
  Administered 2020-11-26: 5 mg via ORAL

## 2020-11-26 MED ORDER — HYDROCHLOROTHIAZIDE 25 MG PO TABS
25.0000 mg | ORAL_TABLET | Freq: Every day | ORAL | Status: DC
  Administered 2020-11-27: 25 mg via ORAL
  Filled 2020-11-26: qty 1

## 2020-11-26 MED ORDER — CELECOXIB 200 MG PO CAPS
ORAL_CAPSULE | ORAL | Status: AC
  Administered 2020-11-26: 400 mg via ORAL
  Filled 2020-11-26: qty 2

## 2020-11-26 MED ORDER — TRANEXAMIC ACID-NACL 1000-0.7 MG/100ML-% IV SOLN: 1000.0000 mg | Freq: Once | INTRAVENOUS | Status: AC

## 2020-11-26 MED ORDER — SODIUM CHLORIDE 0.9 % IV SOLN: INTRAVENOUS | Status: DC

## 2020-11-26 MED ORDER — NEOMYCIN-POLYMYXIN B GU 40-200000 IR SOLN
Status: AC
  Filled 2020-11-26: qty 20

## 2020-11-26 MED ORDER — BUPIVACAINE HCL (PF) 0.25 % IJ SOLN
INTRAMUSCULAR | Status: AC
  Filled 2020-11-26: qty 60

## 2020-11-26 MED ORDER — ENOXAPARIN SODIUM 30 MG/0.3ML IJ SOSY
30.0000 mg | PREFILLED_SYRINGE | Freq: Two times a day (BID) | INTRAMUSCULAR | Status: DC
  Administered 2020-11-27: 30 mg via SUBCUTANEOUS
  Filled 2020-11-26: qty 0.3

## 2020-11-26 MED ORDER — OXYCODONE HCL 5 MG/5ML PO SOLN
5.0000 mg | Freq: Once | ORAL | Status: DC | PRN
Start: 2020-11-26 — End: 2020-11-26

## 2020-11-26 MED ORDER — MENTHOL 3 MG MT LOZG
1.0000 | LOZENGE | OROMUCOSAL | Status: DC | PRN
  Filled 2020-11-26: qty 9

## 2020-11-26 MED ORDER — ADULT MULTIVITAMIN W/MINERALS CH
1.0000 | ORAL_TABLET | Freq: Every day | ORAL | Status: DC
  Administered 2020-11-27: 1 via ORAL
  Filled 2020-11-26: qty 1

## 2020-11-26 MED ORDER — AMLODIPINE BESYLATE 5 MG PO TABS
5.0000 mg | ORAL_TABLET | Freq: Every day | ORAL | Status: DC
  Administered 2020-11-27: 5 mg via ORAL
  Filled 2020-11-26: qty 1

## 2020-11-26 MED ORDER — CEFAZOLIN SODIUM-DEXTROSE 2-4 GM/100ML-% IV SOLN
2.0000 g | Freq: Four times a day (QID) | INTRAVENOUS | Status: AC
  Administered 2020-11-26 (×2): 2 g via INTRAVENOUS
  Filled 2020-11-26 (×2): qty 100

## 2020-11-26 MED ORDER — SODIUM CHLORIDE 0.9 % IV SOLN
INTRAVENOUS | Status: DC | PRN
  Administered 2020-11-26: 60 mL

## 2020-11-26 MED ORDER — MAGNESIUM HYDROXIDE 400 MG/5ML PO SUSP
30.0000 mL | Freq: Every day | ORAL | Status: DC
  Administered 2020-11-26 – 2020-11-27 (×2): 30 mL via ORAL
  Filled 2020-11-26 (×2): qty 30

## 2020-11-26 MED ORDER — BUPIVACAINE LIPOSOME 1.3 % IJ SUSP
INTRAMUSCULAR | Status: AC
  Filled 2020-11-26: qty 20

## 2020-11-26 MED ORDER — LIDOCAINE HCL (CARDIAC) PF 100 MG/5ML IV SOSY
PREFILLED_SYRINGE | INTRAVENOUS | Status: DC | PRN
  Administered 2020-11-26: 20 mg via INTRAVENOUS

## 2020-11-26 MED ORDER — CALCIUM CARBONATE 1250 (500 CA) MG PO TABS
625.0000 mg | ORAL_TABLET | Freq: Every day | ORAL | Status: DC
  Administered 2020-11-27: 625 mg via ORAL
  Filled 2020-11-26: qty 0.5

## 2020-11-26 MED ORDER — FAMOTIDINE 20 MG PO TABS
ORAL_TABLET | ORAL | Status: AC
  Administered 2020-11-26: 20 mg via ORAL
  Filled 2020-11-26: qty 1

## 2020-11-26 MED ORDER — ENSURE PRE-SURGERY PO LIQD
296.0000 mL | Freq: Once | ORAL | Status: DC
  Administered 2020-11-26: 296 mL via ORAL
  Filled 2020-11-26: qty 296

## 2020-11-26 MED ORDER — PROPOFOL 10 MG/ML IV BOLUS
INTRAVENOUS | Status: DC | PRN
  Administered 2020-11-26: 50 ug/kg/min via INTRAVENOUS
  Administered 2020-11-26: 30 mg via INTRAVENOUS

## 2020-11-26 MED ORDER — DIPHENHYDRAMINE HCL 12.5 MG/5ML PO ELIX: 12.5000 mg | ORAL_SOLUTION | ORAL | Status: DC | PRN

## 2020-11-26 MED ORDER — OXYCODONE HCL 5 MG PO TABS: 5.0000 mg | ORAL_TABLET | Freq: Once | ORAL | Status: DC | PRN

## 2020-11-26 MED ORDER — TRANEXAMIC ACID-NACL 1000-0.7 MG/100ML-% IV SOLN
INTRAVENOUS | Status: DC | PRN
  Administered 2020-11-26: 1000 mg via INTRAVENOUS

## 2020-11-26 MED ORDER — TRANEXAMIC ACID-NACL 1000-0.7 MG/100ML-% IV SOLN
INTRAVENOUS | Status: AC
  Filled 2020-11-26: qty 100

## 2020-11-26 MED ORDER — FENTANYL CITRATE (PF) 100 MCG/2ML IJ SOLN: 25.0000 ug | INTRAMUSCULAR | Status: DC | PRN

## 2020-11-26 MED ORDER — BUPIVACAINE HCL (PF) 0.25 % IJ SOLN
INTRAMUSCULAR | Status: DC | PRN
  Administered 2020-11-26: 60 mL

## 2020-11-26 MED ORDER — ONDANSETRON HCL 4 MG/2ML IJ SOLN: 4.0000 mg | Freq: Four times a day (QID) | INTRAMUSCULAR | Status: DC | PRN

## 2020-11-26 MED ORDER — ONDANSETRON HCL 4 MG PO TABS: 4.0000 mg | ORAL_TABLET | Freq: Four times a day (QID) | ORAL | Status: DC | PRN

## 2020-11-26 MED ORDER — PHENYLEPHRINE HCL (PRESSORS) 10 MG/ML IV SOLN
INTRAVENOUS | Status: DC | PRN
  Administered 2020-11-26 (×5): 100 ug via INTRAVENOUS

## 2020-11-26 MED ORDER — OMEGA-3-ACID ETHYL ESTERS 1 G PO CAPS
1.0000 g | ORAL_CAPSULE | Freq: Every day | ORAL | Status: DC
  Administered 2020-11-27: 1 g via ORAL
  Filled 2020-11-26: qty 1

## 2020-11-26 MED ORDER — GABAPENTIN 300 MG PO CAPS
ORAL_CAPSULE | ORAL | Status: AC
  Administered 2020-11-26: 300 mg via ORAL
  Filled 2020-11-26: qty 1

## 2020-11-26 MED ORDER — SENNOSIDES-DOCUSATE SODIUM 8.6-50 MG PO TABS
1.0000 | ORAL_TABLET | Freq: Two times a day (BID) | ORAL | Status: DC
  Administered 2020-11-26 – 2020-11-27 (×3): 1 via ORAL
  Filled 2020-11-26 (×3): qty 1

## 2020-11-26 MED ORDER — DEXAMETHASONE SODIUM PHOSPHATE 10 MG/ML IJ SOLN
INTRAMUSCULAR | Status: AC
  Administered 2020-11-26: 8 mg via INTRAVENOUS
  Filled 2020-11-26: qty 1

## 2020-11-26 MED ORDER — AMPHETAMINE-DEXTROAMPHET ER 5 MG PO CP24
25.0000 mg | ORAL_CAPSULE | ORAL | Status: DC
  Filled 2020-11-26: qty 1

## 2020-11-26 MED ORDER — BISACODYL 10 MG RE SUPP: 10.0000 mg | Freq: Every day | RECTAL | Status: DC | PRN

## 2020-11-26 MED ORDER — METOCLOPRAMIDE HCL 10 MG PO TABS
10.0000 mg | ORAL_TABLET | Freq: Three times a day (TID) | ORAL | Status: DC
  Administered 2020-11-26 – 2020-11-27 (×3): 10 mg via ORAL
  Filled 2020-11-26 (×3): qty 1

## 2020-11-26 MED ORDER — PHENOL 1.4 % MT LIQD
1.0000 | OROMUCOSAL | Status: DC | PRN
  Filled 2020-11-26: qty 177

## 2020-11-26 MED ORDER — TRANEXAMIC ACID-NACL 1000-0.7 MG/100ML-% IV SOLN
INTRAVENOUS | Status: AC
  Administered 2020-11-26: 1000 mg via INTRAVENOUS
  Filled 2020-11-26: qty 100

## 2020-11-26 MED ORDER — ACETAMINOPHEN 10 MG/ML IV SOLN
1000.0000 mg | Freq: Four times a day (QID) | INTRAVENOUS | Status: DC
Start: 2020-11-26 — End: 2020-11-26

## 2020-11-26 MED ORDER — FLEET ENEMA 7-19 GM/118ML RE ENEM: 1.0000 | ENEMA | Freq: Once | RECTAL | Status: DC | PRN

## 2020-11-26 SURGICAL SUPPLY — 77 items
ATTUNE PSFEM LTSZ6 NARCEM KNEE (Femur) ×1 IMPLANT
ATTUNE PSRP INSR SZ6 5 KNEE (Insert) ×1 IMPLANT
BASE TIBIAL ROT PLAT SZ 5 KNEE (Knees) IMPLANT
BATTERY INSTRU NAVIGATION (MISCELLANEOUS) ×8 IMPLANT
BLADE SAW 70X12.5 (BLADE) ×2 IMPLANT
BLADE SAW 90X13X1.19 OSCILLAT (BLADE) ×2 IMPLANT
BLADE SAW 90X25X1.19 OSCILLAT (BLADE) ×2 IMPLANT
BONE CEMENT GENTAMICIN (Cement) ×4 IMPLANT
CEMENT BONE GENTAMICIN 40 (Cement) IMPLANT
COOLER POLAR GLACIER W/PUMP (MISCELLANEOUS) ×2 IMPLANT
CUFF TOURN SGL QUICK 24 (TOURNIQUET CUFF)
CUFF TOURN SGL QUICK 34 (TOURNIQUET CUFF)
CUFF TRNQT CYL 24X4X16.5-23 (TOURNIQUET CUFF) IMPLANT
CUFF TRNQT CYL 34X4.125X (TOURNIQUET CUFF) IMPLANT
DRAPE 3/4 80X56 (DRAPES) ×2 IMPLANT
DRAPE INCISE IOBAN 66X45 STRL (DRAPES) ×2 IMPLANT
DRSG DERMACEA 8X12 NADH (GAUZE/BANDAGES/DRESSINGS) ×2 IMPLANT
DRSG MEPILEX SACRM 8.7X9.8 (GAUZE/BANDAGES/DRESSINGS) ×2 IMPLANT
DRSG OPSITE POSTOP 4X14 (GAUZE/BANDAGES/DRESSINGS) ×2 IMPLANT
DRSG TEGADERM 4X4.75 (GAUZE/BANDAGES/DRESSINGS) ×2 IMPLANT
DURAPREP 26ML APPLICATOR (WOUND CARE) ×4 IMPLANT
ELECT CAUTERY BLADE 6.4 (BLADE) ×2 IMPLANT
ELECT REM PT RETURN 9FT ADLT (ELECTROSURGICAL) ×2
ELECTRODE REM PT RTRN 9FT ADLT (ELECTROSURGICAL) ×1 IMPLANT
EX-PIN ORTHOLOCK NAV 4X150 (PIN) ×4 IMPLANT
GAUZE 4X4 16PLY ~~LOC~~+RFID DBL (SPONGE) IMPLANT
GLOVE SRG 8 PF TXTR STRL LF DI (GLOVE) ×1 IMPLANT
GLOVE SURG ENC TEXT LTX SZ7.5 (GLOVE) ×4 IMPLANT
GLOVE SURG UNDER POLY LF SZ7.5 (GLOVE) ×2 IMPLANT
GLOVE SURG UNDER POLY LF SZ8 (GLOVE) ×1
GOWN STRL REUS W/ TWL LRG LVL3 (GOWN DISPOSABLE) ×2 IMPLANT
GOWN STRL REUS W/ TWL XL LVL3 (GOWN DISPOSABLE) ×1 IMPLANT
GOWN STRL REUS W/TWL LRG LVL3 (GOWN DISPOSABLE) ×2
GOWN STRL REUS W/TWL XL LVL3 (GOWN DISPOSABLE) ×1
HEMOVAC 400CC 10FR (MISCELLANEOUS) ×2 IMPLANT
HOLDER FOLEY CATH W/STRAP (MISCELLANEOUS) ×2 IMPLANT
IRRIGATION SURGIPHOR STRL (IV SOLUTION) ×2 IMPLANT
IV NS IRRIG 3000ML ARTHROMATIC (IV SOLUTION) ×2 IMPLANT
KIT TURNOVER KIT A (KITS) ×2 IMPLANT
KNIFE SCULPS 14X20 (INSTRUMENTS) ×2 IMPLANT
LABEL OR SOLS (LABEL) ×2 IMPLANT
MANIFOLD NEPTUNE II (INSTRUMENTS) ×4 IMPLANT
NDL SAFETY ECLIPSE 18X1.5 (NEEDLE) ×1 IMPLANT
NDL SPNL 20GX3.5 QUINCKE YW (NEEDLE) ×2 IMPLANT
NEEDLE HYPO 18GX1.5 SHARP (NEEDLE) ×1
NEEDLE SPNL 20GX3.5 QUINCKE YW (NEEDLE) ×4 IMPLANT
NS IRRIG 500ML POUR BTL (IV SOLUTION) ×2 IMPLANT
PACK TOTAL KNEE (MISCELLANEOUS) ×2 IMPLANT
PAD ABD DERMACEA PRESS 5X9 (GAUZE/BANDAGES/DRESSINGS) ×4 IMPLANT
PAD WRAPON POLAR KNEE (MISCELLANEOUS) ×1 IMPLANT
PATELLA MEDIAL ATTUN 35MM KNEE (Knees) ×1 IMPLANT
PENCIL SMOKE EVACUATOR COATED (MISCELLANEOUS) ×2 IMPLANT
PIN DRILL FIX HALF THREAD (BIT) ×4 IMPLANT
PIN DRILL QUICK PACK ×3 IMPLANT
PIN FIXATION 1/8DIA X 3INL (PIN) ×2 IMPLANT
PULSAVAC PLUS IRRIG FAN TIP (DISPOSABLE) ×2
SOL PREP PVP 2OZ (MISCELLANEOUS) ×2
SOLUTION PREP PVP 2OZ (MISCELLANEOUS) ×1 IMPLANT
SPONGE DRAIN TRACH 4X4 STRL 2S (GAUZE/BANDAGES/DRESSINGS) ×2 IMPLANT
SPONGE T-LAP 18X18 ~~LOC~~+RFID (SPONGE) ×6 IMPLANT
STAPLER SKIN PROX 35W (STAPLE) ×2 IMPLANT
STOCKINETTE IMPERV 14X48 (MISCELLANEOUS) IMPLANT
STRAP TIBIA SHORT (MISCELLANEOUS) ×2 IMPLANT
SUCTION FRAZIER HANDLE 10FR (MISCELLANEOUS) ×1
SUCTION TUBE FRAZIER 10FR DISP (MISCELLANEOUS) ×1 IMPLANT
SUT VIC AB 0 CT1 36 (SUTURE) ×4 IMPLANT
SUT VIC AB 1 CT1 36 (SUTURE) ×4 IMPLANT
SUT VIC AB 2-0 CT2 27 (SUTURE) ×2 IMPLANT
SYR 20ML LL LF (SYRINGE) ×2 IMPLANT
SYR 30ML LL (SYRINGE) ×4 IMPLANT
TIBIAL BASE ROT PLAT SZ 5 KNEE (Knees) ×2 IMPLANT
TIP FAN IRRIG PULSAVAC PLUS (DISPOSABLE) ×1 IMPLANT
TOWEL OR 17X26 4PK STRL BLUE (TOWEL DISPOSABLE) ×2 IMPLANT
TOWER CARTRIDGE SMART MIX (DISPOSABLE) ×2 IMPLANT
TRAY FOLEY MTR SLVR 16FR STAT (SET/KITS/TRAYS/PACK) ×2 IMPLANT
WATER STERILE IRR 500ML POUR (IV SOLUTION) ×2 IMPLANT
WRAPON POLAR PAD KNEE (MISCELLANEOUS) ×2

## 2020-11-26 NOTE — Anesthesia Procedure Notes (Signed)
Spinal  Patient location during procedure: OR Start time: 11/26/2020 7:20 AM End time: 11/26/2020 7:30 AM Reason for block: surgical anesthesia Staffing Resident/CRNA: Rona Ravens, CRNA Preanesthetic Checklist Completed: patient identified, IV checked, site marked, risks and benefits discussed, surgical consent, monitors and equipment checked, pre-op evaluation and timeout performed Spinal Block Patient position: sitting Prep: Betadine Patient monitoring: heart rate, continuous pulse ox, blood pressure and cardiac monitor Approach: midline Location: L4-5 Injection technique: single-shot Needle Needle type: Whitacre and Introducer  Needle gauge: 24 G Needle length: 9 cm Assessment Events: CSF return Additional Notes Negative paresthesia. Negative blood return. Positive free-flowing CSF. Expiration date of kit checked and confirmed. Patient tolerated procedure well, without complications.

## 2020-11-26 NOTE — Evaluation (Signed)
Physical Therapy Evaluation Patient Details Name: Meghan Myers MRN: [id number] DOB: 1950-10-02 Today's Date: 11/26/2020  History of Present Illness  Pt is a 70 y/o F with PMH: Degenerative arthrosis of the left knee (s/p arthroscopy ~6MA). Now s/p elective L TKR on 9/28.  Clinical Impression  Pt did very well with day of surgery PT exam and subsequent gait training and exercises.  She had expected pain (~6/10) but was able to do all exercises with AROM/resistance, ambulated 100 ft with relative ease, had >90 deg of flexion and generally speaking did very well.      Recommendations for follow up therapy are one component of a multi-disciplinary discharge planning process, led by the attending physician.  Recommendations may be updated based on patient status, additional functional criteria and insurance authorization.  Follow Up Recommendations Follow surgeon's recommendation for DC plan and follow-up therapies    Equipment Recommendations  Rolling walker with 5" wheels;3in1 (PT)    Recommendations for Other Services       Precautions / Restrictions Precautions Precautions: Fall Restrictions Weight Bearing Restrictions: Yes LLE Weight Bearing: Weight bearing as tolerated      Mobility  Bed Mobility Overal bed mobility: Modified Independent             General bed mobility comments: sitting EOB on arrival    Transfers Overall transfer level: Needs assistance Equipment used: Rolling walker (2 wheeled) Transfers: Sit to/from Stand Sit to Stand: Min guard         General transfer comment: Cuing for sequencing and set up, able to rise w/o direct assist  Ambulation/Gait Ambulation/Gait assistance: Supervision Gait Distance (Feet): 100 Feet Assistive device: Rolling walker (2 wheeled)       General Gait Details: Pt was able to assume consistent cadence w/o escessive UE reliance quickly and managed to ambulate 100 ft w/o issue.  Vitals remained appropriate, expected  walker reliance, no hesitation with L WBing  Stairs            Wheelchair Mobility    Modified Rankin (Stroke Patients Only)       Balance Overall balance assessment: Modified Independent Sitting-balance support: Feet supported Sitting balance-Leahy Scale: Good       Standing balance-Leahy Scale: Good Standing balance comment: benefits from UE support on RW. Able to alternate UE support for static standing tasks.                             Pertinent Vitals/Pain Pain Assessment: 0-10 Pain Score: 7  Pain Location: L knee Pain Descriptors / Indicators: Aching;Guarding;Operative site guarding Pain Intervention(s): Limited activity within patient's tolerance;Monitored during session;Patient requesting pain meds-RN notified;RN gave pain meds during session    Home Living Family/patient expects to be discharged to:: Private residence Living Arrangements: Spouse/significant other Available Help at Discharge: Family;Available PRN/intermittently (husband works part time, but can be around 24/7 if needed) Type of Home: House Home Access: Stairs to enter Entrance Stairs-Rails: None Entrance Stairs-Number of Steps: 3 (garage) Home Layout: Two level;Able to live on main level with bedroom/bathroom Home Equipment: None      Prior Function Level of Independence: Independent         Comments: INDEP including no AD for fxl mobility and driving     Hand Dominance        Extremity/Trunk Assessment   Upper Extremity Assessment Upper Extremity Assessment: Overall WFL for tasks assessed    Lower Extremity Assessment Lower Extremity  Assessment: Generalized weakness LLE Deficits / Details: expected post-op weakness, able to do 10 SLRs LLE: Unable to fully assess due to pain       Communication   Communication: No difficulties  Cognition Arousal/Alertness: Awake/alert Behavior During Therapy: WFL for tasks assessed/performed Overall Cognitive Status:  Within Functional Limits for tasks assessed                                        General Comments General comments (skin integrity, edema, etc.): Pt meets or exceeds typical POD0 expectations, doing well    Exercises Total Joint Exercises Ankle Circles/Pumps: AROM;10 reps Quad Sets: Strengthening;10 reps Short Arc Quad: AROM;10 reps Hip ABduction/ADduction: Strengthening;10 reps Straight Leg Raises: AROM;10 reps Long Arc Quad: Strengthening;AROM;10 reps Knee Flexion: 10 reps;Strengthening (gentle overpressure at end range) Goniometric ROM: 0-91 Other Exercises Other Exercises: OT ed with pt and family re: role of OT in acute setting, polar care mgt, safety considerations (footwear with tread), sequence use of 2WW, compression stocking mgt.   Assessment/Plan    PT Assessment Patient needs continued PT services  PT Problem List Decreased strength;Decreased range of motion;Decreased activity tolerance;Decreased balance;Decreased mobility;Decreased coordination;Decreased knowledge of use of DME;Decreased safety awareness;Pain       PT Treatment Interventions Gait training;DME instruction;Stair training;Therapeutic activities;Therapeutic exercise;Functional mobility training;Balance training;Neuromuscular re-education;Patient/family education    PT Goals (Current goals can be found in the Care Plan section)  Acute Rehab PT Goals Patient Stated Goal: go home PT Goal Formulation: With patient/family Time For Goal Achievement: 12/10/20 Potential to Achieve Goals: Good    Frequency BID   Barriers to discharge        Co-evaluation               AM-PAC PT "6 Clicks" Mobility  Outcome Measure Help needed turning from your back to your side while in a flat bed without using bedrails?: None Help needed moving from lying on your back to sitting on the side of a flat bed without using bedrails?: None Help needed moving to and from a bed to a chair (including a  wheelchair)?: A Little Help needed standing up from a chair using your arms (e.g., wheelchair or bedside chair)?: A Little Help needed to walk in hospital room?: A Little Help needed climbing 3-5 steps with a railing? : A Little 6 Click Score: 20    End of Session Equipment Utilized During Treatment: Gait belt Activity Tolerance: Patient tolerated treatment well Patient left: with chair alarm set;with call bell/phone within reach;with family/visitor present Nurse Communication: Mobility status PT Visit Diagnosis: Muscle weakness (generalized) (M62.81);Difficulty in walking, not elsewhere classified (R26.2);Pain Pain - Right/Left: Left Pain - part of body: Knee    Time: 4193-7902 PT Time Calculation (min) (ACUTE ONLY): 39 min   Charges:   PT Evaluation $PT Eval Low Complexity: 1 Low PT Treatments $Gait Training: 8-22 mins $Therapeutic Exercise: 8-22 mins        Kreg Shropshire, DPT 11/26/2020, 4:14 PM

## 2020-11-26 NOTE — Op Note (Signed)
OPERATIVE NOTE  DATE OF SURGERY:  11/26/2020  PATIENT NAME:  Meghan Myers   DOB: December 17, 1950  MRN: [id number]  PRE-OPERATIVE DIAGNOSIS: Degenerative arthrosis of the left knee, primary  POST-OPERATIVE DIAGNOSIS:  Same  PROCEDURE:  Left total knee arthroplasty using computer-assisted navigation  SURGEON:  Marciano Sequin. M.D.  ANESTHESIA: spinal  ESTIMATED BLOOD LOSS: 50 mL  FLUIDS REPLACED: 1000 mL of crystalloid  TOURNIQUET TIME: 85 minutes  DRAINS: 2 medium Hemovac drains  SOFT TISSUE RELEASES: Anterior cruciate ligament, posterior cruciate ligament, deep medial collateral ligament, patellofemoral ligament  IMPLANTS UTILIZED: DePuy Attune size 6N posterior stabilized femoral component (cemented), size 5 rotating platform tibial component (cemented), 35 mm medialized dome patella (cemented), and a 5 mm stabilized rotating platform polyethylene insert.  INDICATIONS FOR SURGERY: Meghan Myers is a 70 y.o. year old female with a long history of progressive knee pain. X-rays demonstrated severe degenerative changes in tricompartmental fashion. The patient had not seen any significant improvement despite conservative nonsurgical intervention. After discussion of the risks and benefits of surgical intervention, the patient expressed understanding of the risks benefits and agree with plans for total knee arthroplasty.   The risks, benefits, and alternatives were discussed at length including but not limited to the risks of infection, bleeding, nerve injury, stiffness, blood clots, the need for revision surgery, cardiopulmonary complications, among others, and they were willing to proceed.  PROCEDURE IN DETAIL: The patient was brought into the operating room and, after adequate spinal anesthesia was achieved, a tourniquet was placed on the patient's upper thigh. The patient's knee and leg were cleaned and prepped with alcohol and DuraPrep and draped in the usual sterile fashion. A "timeout"  was performed as per usual protocol. The lower extremity was exsanguinated using an Esmarch, and the tourniquet was inflated to 300 mmHg. An anterior longitudinal incision was made followed by a standard mid vastus approach. The deep fibers of the medial collateral ligament were elevated in a subperiosteal fashion off of the medial flare of the tibia so as to maintain a continuous soft tissue sleeve. The patella was subluxed laterally and the patellofemoral ligament was incised. Inspection of the knee demonstrated severe degenerative changes with full-thickness loss of articular cartilage. Osteophytes were debrided using a rongeur. Anterior and posterior cruciate ligaments were excised. Two 4.0 mm Schanz pins were inserted in the femur and into the tibia for attachment of the array of trackers used for computer-assisted navigation. Hip center was identified using a circumduction technique. Distal landmarks were mapped using the computer. The distal femur and proximal tibia were mapped using the computer. The distal femoral cutting guide was positioned using computer-assisted navigation so as to achieve a 5 distal valgus cut. The femur was sized and it was felt that a size 6N femoral component was appropriate. A size 6 femoral cutting guide was positioned and the anterior cut was performed and verified using the computer. This was followed by completion of the posterior and chamfer cuts. Femoral cutting guide for the central box was then positioned in the center box cut was performed.  Attention was then directed to the proximal tibia. Medial and lateral menisci were excised. The extramedullary tibial cutting guide was positioned using computer-assisted navigation so as to achieve a 0 varus-valgus alignment and 3 posterior slope. The cut was performed and verified using the computer. The proximal tibia was sized and it was felt that a size 5 tibial tray was appropriate. Tibial and femoral trials were inserted  followed  by insertion of a 5 mm polyethylene insert. This allowed for excellent mediolateral soft tissue balancing both in flexion and in full extension. Finally, the patella was cut and prepared so as to accommodate a 35 mm medialized dome patella. A patella trial was placed and the knee was placed through a range of motion with excellent patellar tracking appreciated. The femoral trial was removed after debridement of posterior osteophytes. The central post-hole for the tibial component was reamed followed by insertion of a keel punch. Tibial trials were then removed. Cut surfaces of bone were irrigated with copious amounts of normal saline using pulsatile lavage and then suctioned dry. Polymethylmethacrylate cement with gentamicin was prepared in the usual fashion using a vacuum mixer. Cement was applied to the cut surface of the proximal tibia as well as along the undersurface of a size 5 rotating platform tibial component. Tibial component was positioned and impacted into place. Excess cement was removed using [employer] fast streamer. Cement was then applied to the cut surfaces of the femur as well as along the posterior flanges of the size 6N femoral component. The femoral component was positioned and impacted into place. Excess cement was removed using [employer] fast streamer. A 5 mm polyethylene trial was inserted and the knee was brought into full extension with steady axial compression applied. Finally, cement was applied to the backside of a 35 mm medialized dome patella and the patellar component was positioned and patellar clamp applied. Excess cement was removed using [employer] fast streamer. After adequate curing of the cement, the tourniquet was deflated after a total tourniquet time of 85 minutes. Hemostasis was achieved using electrocautery. The knee was irrigated with copious amounts of normal saline using pulsatile lavage followed by 500 ml of Surgiphor and then suctioned dry. 20 mL of 1.3% Exparel and 60 mL of 0.25%  Marcaine in 40 mL of normal saline was injected along the posterior capsule, medial and lateral gutters, and along the arthrotomy site. A 5 mm stabilized rotating platform polyethylene insert was inserted and the knee was placed through a range of motion with excellent mediolateral soft tissue balancing appreciated and excellent patellar tracking noted. 2 medium drains were placed in the wound bed and brought out through separate stab incisions. The medial parapatellar portion of the incision was reapproximated using interrupted sutures of #1 Vicryl. Subcutaneous tissue was approximated in layers using first #0 Vicryl followed #2-0 Vicryl. The skin was approximated with skin staples. A sterile dressing was applied.  The patient tolerated the procedure well and was transported to the recovery room in stable condition.    Melodie Ashworth P. Holley Bouche., M.D.
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
## 2020-11-26 NOTE — Transfer of Care (Signed)
Immediate Anesthesia Transfer of Care Note  Patient: Meghan Myers  Procedure(s) Performed: COMPUTER ASSISTED TOTAL KNEE ARTHROPLASTY - RNFA (Left: Knee)  Patient Location: PACU  Anesthesia Type:Spinal  Level of Consciousness: awake, alert  and oriented  Airway & Oxygen Therapy: Patient Spontanous Breathing  Post-op Assessment: Report given to RN and Post -op Vital signs reviewed and stable  Post vital signs: Reviewed and stable  Last Vitals:  Vitals Value Taken Time  BP 133/67 11/26/20 1115  Temp    Pulse 72 11/26/20 1117  Resp 16 11/26/20 1117  SpO2 97 % 11/26/20 1117  Vitals shown include unvalidated device data.  Last Pain:  Vitals:   11/26/20 0631  TempSrc: Tympanic  PainSc: 0-No pain      Patients Stated Pain Goal: 0 (32/02/33 [id number])  Complications: No notable events documented.

## 2020-11-26 NOTE — Evaluation (Signed)
Occupational Therapy Evaluation Patient Details Name: Meghan Myers MRN: [id number] DOB: 1950/10/22 Today's Date: 11/26/2020   History of Present Illness Pt is a 70 y/o F with PMH: Degenerative arthrosis of the left knee (s/p arthroscopy ~6MA). Now s/p elective L TKR on 9/28 ([place]).   Clinical Impression   Pt seen for OT evaluation this date in setting of acute hospitalization s/p elective TKA. Pt presents this date with some L knee limitations d/t post op pain. Pt reports being INDEP at baseline including no use of AD for fxl mobility. Pt lives with spouse in 2 story home with BR/BA on Pt requires CGA/MIN A for STS with RW, cues for hand placement and sequence. Pt demos F standing balance. She is able to perform fxl mobility to/from restroom with RW with CGA for safety and balance. POT managing lines/leads. Pt reports 5/10 pain in sitting and reports worsening with fxl mobility, RN aware and medicates pt end of OT session. Pt able to perform toileting in sitting with SETUP, hand hygiene in standing with SUPV/CGA for balance. Pt returned to EOB sitting at end of session and PT presents for assessment. OT education with pt and family re: pol;ar care mgt and compression stocking mgt. All needs met and in reach. Will continue to follow.      Recommendations for follow up therapy are one component of a multi-disciplinary discharge planning process, led by the attending physician.  Recommendations may be updated based on patient status, additional functional criteria and insurance authorization.   Follow Up Recommendations  Home health OT    Equipment Recommendations  3 in 1 bedside commode;Other (comment) (2ww)    Recommendations for Other Services       Precautions / Restrictions Precautions Precautions: Fall Restrictions Weight Bearing Restrictions: Yes LLE Weight Bearing: Weight bearing as tolerated      Mobility Bed Mobility Overal bed mobility: Modified Independent              General bed mobility comments: HOB elevated, increased time.    Transfers Overall transfer level: Needs assistance Equipment used: Rolling walker (2 wheeled) Transfers: Sit to/from Stand Sit to Stand: Min assist;Min guard         General transfer comment: MIN A with progress to CGA. Cues for hand placement/sequence of 2WW use.    Balance Overall balance assessment: Mild deficits observed, not formally tested Sitting-balance support: Feet supported Sitting balance-Leahy Scale: Good       Standing balance-Leahy Scale: Fair Standing balance comment: benefits from UE support on RW. Able to alternate UE support for static standing tasks.                           ADL either performed or assessed with clinical judgement   ADL Overall ADL's : Needs assistance/impaired                                       General ADL Comments: INDEP for seated UB ADLs, CGA For standing UB ADLs wiht UE support on RW. CGA for fxl mobility. MIN A to CGA for transfers. MIN A for seated LB ADLs. MAX A for compression stocking mgt.     Vision Baseline Vision/History: 1 Wears glasses Ability to See in Adequate Light: 0 Adequate Patient Visual Report: No change from baseline       Perception  Praxis      Pertinent Vitals/Pain Pain Assessment: 0-10 Pain Score: 5  Pain Location: L knee, increases with fxl mobility Pain Descriptors / Indicators: Aching;Guarding;Operative site guarding Pain Intervention(s): Limited activity within patient's tolerance;Monitored during session;Patient requesting pain meds-RN notified;RN gave pain meds during session     Hand Dominance     Extremity/Trunk Assessment Upper Extremity Assessment Upper Extremity Assessment: Overall WFL for tasks assessed   Lower Extremity Assessment Lower Extremity Assessment: Defer to PT evaluation;LLE deficits/detail LLE Deficits / Details: Able to perform SLR while in supine. Expected post op knee  flexion limitations impacting LB ADLs. LLE: Unable to fully assess due to pain       Communication Communication Communication: No difficulties   Cognition Arousal/Alertness: Awake/alert Behavior During Therapy: WFL for tasks assessed/performed Overall Cognitive Status: Within Functional Limits for tasks assessed                                     General Comments       Exercises Other Exercises Other Exercises: OT ed with pt and family re: role of OT in acute setting, polar care mgt, safety considerations (footwear with tread), sequence use of 2WW, compression stocking mgt.   Shoulder Instructions      Home Living Family/patient expects to be discharged to:: Private residence Living Arrangements: Spouse/significant other Available Help at Discharge: Family;Available PRN/intermittently (spouse works part time (~ 4 hrs some days of the week)) Type of Home: House Home Access: Stairs to enter [employer] of Steps: 3 (garage) Entrance Stairs-Rails: None Home Layout: Two level;Able to live on main level with bedroom/bathroom Alternate Level Stairs-Number of Steps: flight             Home Equipment: None          Prior Functioning/Environment Level of Independence: Independent        Comments: INDEP including no AD for fxl mobility and driving        OT Problem List: Decreased activity tolerance;Impaired balance (sitting and/or standing);Decreased knowledge of use of DME or AE;Pain      OT Treatment/Interventions: Self-care/ADL training;Therapeutic exercise;DME and/or AE instruction;Therapeutic activities;Balance training    OT Goals(Current goals can be found in the care plan section) Acute Rehab OT Goals Patient Stated Goal: to get this knee feeling better and go home OT Goal Formulation: With patient/family Time For Goal Achievement: 12/10/20 Potential to Achieve Goals: Good ADL Goals Pt Will Perform Lower Body Dressing: with  supervision;sit to/from stand;with adaptive equipment Pt Will Transfer to Toilet: ambulating;with supervision (with LRAD to/from restroom) Pt Will Perform Toileting - Clothing Manipulation and hygiene: with supervision;sit to/from stand  OT Frequency: Min 1X/week   Barriers to D/C:            Co-evaluation              AM-PAC OT "6 Clicks" Daily Activity     Outcome Measure Help from another person eating meals?: None Help from another person taking care of personal grooming?: A Little Help from another person toileting, which includes using toliet, bedpan, or urinal?: A Little Help from another person bathing (including washing, rinsing, drying)?: A Little Help from another person to put on and taking off regular upper body clothing?: None Help from another person to put on and taking off regular lower body clothing?: A Little 6 Click Score: 20   End of Session Equipment Utilized  During Treatment: Gait belt;Rolling walker Nurse Communication: Mobility status;Patient requests pain meds  Activity Tolerance: Patient tolerated treatment well Patient left: Other (comment) (sitting EOB with PT presenting for evaluation.)  OT Visit Diagnosis: Unsteadiness on feet (R26.81);Muscle weakness (generalized) (M62.81);Pain Pain - Right/Left: Left Pain - part of body: Knee                Time: 1420-1445 OT Time Calculation (min): 25 min Charges:  OT General Charges $OT Visit: 1 Visit OT Evaluation $OT Eval Low Complexity: 1 Low OT Treatments $Self Care/Home Management : 8-22 mins  Gerrianne Scale, MS, OTR/L ascom [phone] 11/26/20, 3:23 PM

## 2020-11-26 NOTE — H&P (Signed)
The patient has been re-examined, and the chart reviewed, and there have been no interval changes to the documented history and physical.    The risks, benefits, and alternatives have been discussed at length. The patient expressed understanding of the risks benefits and agreed with plans for surgical intervention.  Suhana Wilner P. Garyn Arlotta, Jr. M.D.    

## 2020-11-27 ENCOUNTER — Encounter: Payer: Self-pay | Admitting: Orthopedic Surgery

## 2020-11-27 DIAGNOSIS — M1712 Unilateral primary osteoarthritis, left knee: Secondary | ICD-10-CM | POA: Diagnosis not present

## 2020-11-27 MED ORDER — OXYCODONE HCL 5 MG PO TABS: 5.0000 mg | ORAL_TABLET | ORAL | 0 refills | Status: DC | PRN

## 2020-11-27 MED ORDER — TRAMADOL HCL 50 MG PO TABS: 50.0000 mg | ORAL_TABLET | ORAL | 0 refills | Status: DC | PRN

## 2020-11-27 MED ORDER — ENOXAPARIN SODIUM 40 MG/0.4ML IJ SOSY: 40.0000 mg | PREFILLED_SYRINGE | INTRAMUSCULAR | 0 refills | Status: DC

## 2020-11-27 MED ORDER — CELECOXIB 200 MG PO CAPS: 200.0000 mg | ORAL_CAPSULE | Freq: Two times a day (BID) | ORAL | 1 refills | Status: DC

## 2020-11-27 NOTE — Discharge Summary (Signed)
Physician Discharge Summary  Subjective: 1 Day Post-Op Procedure(s) (LRB): COMPUTER ASSISTED TOTAL KNEE ARTHROPLASTY - RNFA (Left) Patient reports pain as mild.   Patient seen in rounds with Dr. Marry Guan. Patient is well, and has had no acute complaints or problems Patient is ready to go home with home health physical therapy.  She has done well since surgery.  Physician Discharge Summary  Patient ID: Meghan Myers MRN: [id number] DOB/AGE: 1950-12-11 70 y.o.  Admit date: 11/26/2020 Discharge date: 11/27/2020  Admission Diagnoses:  Discharge Diagnoses:  Active Problems:   Total knee replacement status   Discharged Condition: good  Hospital Course: The patient is postop day 1 from a left total knee arthroplasty.  She is doing well since surgery.  Her vitals have remained stable.  Her Hemovac had put out 50 cc initially and then 145 cc this morning.  The drain was removed with no complication.  The patient is ready to go home after physical therapy this morning.  She ambulated 100 feet last night.  Treatments: surgery:  Left total knee arthroplasty using computer-assisted navigation   SURGEON:  Marciano Sequin. M.D.   ANESTHESIA: spinal   ESTIMATED BLOOD LOSS: 50 mL   FLUIDS REPLACED: 1000 mL of crystalloid   TOURNIQUET TIME: 85 minutes   DRAINS: 2 medium Hemovac drains   SOFT TISSUE RELEASES: Anterior cruciate ligament, posterior cruciate ligament, deep medial collateral ligament, patellofemoral ligament   IMPLANTS UTILIZED: DePuy Attune size 6N posterior stabilized femoral component (cemented), size 5 rotating platform tibial component (cemented), 35 mm medialized dome patella (cemented), and a 5 mm stabilized rotating platform polyethylene insert.    Discharge Exam: Blood pressure (!) 120/52, pulse 75, temperature 98.2 F (36.8 C), resp. rate 17, height 5' 0.5" (1.537 m), weight 53.5 kg, SpO2 96 %.   Disposition: Discharge disposition: 01-Home or Self  Care        Allergies as of 11/27/2020       Reactions   Other Dermatitis   Cheap metal   Meloxicam Other (See Comments)   "Disturbs my liver"   Sulfasalazine Nausea Only   "Disturbs my liver"        Medication List     STOP taking these medications    diclofenac 75 MG EC tablet Commonly known as: VOLTAREN       TAKE these medications    acetaminophen 500 MG tablet Commonly known as: TYLENOL Take 500 mg by mouth in the morning and at bedtime.   amLODipine 5 MG tablet Commonly known as: NORVASC Take 5 mg by mouth daily.   amphetamine-dextroamphetamine 25 MG 24 hr capsule Commonly known as: ADDERALL XR Take 25 mg by mouth every morning.   atorvastatin 20 MG tablet Commonly known as: LIPITOR Take 20 mg by mouth daily.   CALCIUM 600 PO Take 600 mg by mouth daily.   celecoxib 200 MG capsule Commonly known as: CELEBREX Take 1 capsule (200 mg total) by mouth 2 (two) times daily.   enoxaparin 40 MG/0.4ML injection Commonly known as: LOVENOX Inject 0.4 mLs (40 mg total) into the skin daily for 14 days.   Fish Oil 1000 MG Caps Take 1,000 mg by mouth daily.   hydrochlorothiazide 25 MG tablet Commonly known as: HYDRODIURIL Take 25 mg by mouth daily.   multivitamin with minerals Tabs tablet Take 1 tablet by mouth daily.   oxyCODONE 5 MG immediate release tablet Commonly known as: Oxy IR/ROXICODONE Take 1 tablet (5 mg total) by mouth every 4 (four)  hours as needed for moderate pain (pain score 4-6).   sertraline 100 MG tablet Commonly known as: ZOLOFT Take 150 mg by mouth daily.   traMADol 50 MG tablet Commonly known as: ULTRAM Take 1-2 tablets (50-100 mg total) by mouth every 4 (four) hours as needed for moderate pain.               Durable Medical Equipment  (From admission, onward)           Start     Ordered   11/26/20 1249  DME Walker rolling  Once       Question:  Patient needs a walker to treat with the following condition   Answer:  Total knee replacement status   11/26/20 1248   11/26/20 1249  DME Bedside commode  Once       Question:  Patient needs a bedside commode to treat with the following condition  Answer:  Total knee replacement status   11/26/20 1248            Follow-up Information     Fausto Skillern, PA-C Follow up on 12/11/2020.   Specialty: Orthopedic Surgery Why: at 1:15pm Contact information: [place] [address] [phone]         Dereck Leep, MD Follow up on 01/15/2021.   Specialty: Orthopedic Surgery Why: at 2:30pm Contact information: [address] [facility] [place] [address] [phone]                 Signed: Prescott Parma, Lashanna Angelo 11/27/2020, 7:21 AM   Objective: Vital signs in last 24 hours: Temp:  [97.6 F (36.4 C)-98.8 F (37.1 C)] 98.2 F (36.8 C) (09/29 0450) Pulse Rate:  [68-75] 75 (09/29 0450) Resp:  [13-17] 17 (09/29 0450) BP: (113-141)/(49-67) 120/52 (09/29 0450) SpO2:  [96 %-100 %] 96 % (09/29 0450)  Intake/Output from previous day:  Intake/Output Summary (Last 24 hours) at 11/27/2020 0721 Last data filed at 11/27/2020 0500 Gross per 24 hour  Intake 899.97 ml  Output 606 ml  Net 293.97 ml    Intake/Output this shift: No intake/output data recorded.  Labs: No results for input(s): HGB in the last 72 hours. No results for input(s): WBC, RBC, HCT, PLT in the last 72 hours. No results for input(s): NA, K, CL, CO2, BUN, CREATININE, GLUCOSE, CALCIUM in the last 72 hours. No results for input(s): LABPT, INR in the last 72 hours.  EXAM: General - Patient is Alert and Oriented Extremity - Neurovascular intact Sensation intact distally Dorsiflexion/Plantar flexion intact Compartment soft Incision - clean, dry, with the Hemovac tubing removed with no complication.  The Hemovac tubing was intact on removal. Motor Function -dorsiflexion and  plantarflexion are intact.  Able to do a straight leg raise independently.  Assessment/Plan: 1 Day Post-Op Procedure(s) (LRB): COMPUTER ASSISTED TOTAL KNEE ARTHROPLASTY - RNFA (Left) Procedure(s) (LRB): COMPUTER ASSISTED TOTAL KNEE ARTHROPLASTY - RNFA (Left) Past Medical History:  Diagnosis Date   Cancer ([place]) 10/2016   kidney ca   Chronic kidney disease    Depression    History of kidney stones    HLD (hyperlipidemia)    Hypertension    Liver disease    Osteoarthritis    Rheumatoid arthritis ([place])    Shingles    Squamous cell carcinoma of skin 01/27/2015   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    Active Problems:   Total knee replacement status  Estimated  body mass index is 22.67 kg/m as calculated from the following:   Height as of this encounter: 5' 0.5" (1.537 m).   Weight as of this encounter: 53.5 kg. Advance diet Up with therapy D/C IV fluids Discharge home with home health Diet - Regular diet Follow up - in 2 weeks Activity - WBAT Disposition - Home Condition Upon Discharge - Good DVT Prophylaxis - Lovenox  Reche Dixon, PA-C Orthopaedic Surgery 11/27/2020, 7:21 AM

## 2020-11-27 NOTE — TOC Initial Note (Signed)
Transition of Care [facility]) - Initial/Assessment Note    Patient Details  Name: Meghan Myers MRN: [id number] Date of Birth: 1950-10-12  Transition of [facility]) CM/SW Contact:    Pete Pelt, RN Phone Number: 11/27/2020, 11:51 AM  Clinical Narrative:     Patient lives at home with spouse.  No concerns about getting to appointments or getting medications.  Can take medications as directed.  DME ordered with [employer] to be delivered to room.  Patient and spouse have no further concerns.                Expected Discharge Plan: [place] Barriers to Discharge: Barriers Resolved   Patient Goals and CMS Choice     Choice offered to / list presented to : NA  Expected Discharge Plan and Services Expected Discharge Plan: [place]   Discharge Planning Services: CM Consult Post Acute Care Choice: Durable Medical Equipment, Home Health Living arrangements for the past 2 months: Single Family Home Expected Discharge Date: 11/27/20               DME Arranged: Berta Minor rolling   Date DME Agency Contacted: 11/27/20 Time DME Agency Contacted: 47 Representative spoke with at DME Agency: Suanne Marker HH Arranged: PT, OT [place] Agency: [place] Date [place]: 11/27/20 Time [place]: 53 Representative spoke with at HH Agency: [place], arranged prior to admission  Prior Living Arrangements/Services Living arrangements for the past 2 months: [place] with:: Self, Spouse Patient language and need for interpreter reviewed:: Yes (No interpreter required) Do you feel safe going back to the place where you live?: Yes      Need for Family Participation in Patient Care: Yes (Comment) Care giver support system in place?: Yes (comment) Current home services:  (no current home services) Criminal Activity/Legal Involvement Pertinent to Current Situation/Hospitalization: No - Comment as needed  Activities of Daily  Living Home Assistive Devices/Equipment: Eyeglasses ADL Screening (condition at time of admission) Patient's cognitive ability adequate to safely complete daily activities?: Yes Is the patient deaf or have difficulty hearing?: Yes (somewhat) Does the patient have difficulty seeing, even when wearing glasses/contacts?: Yes (has cataracts on both eyes, wears corrective glasses) Does the patient have difficulty concentrating, remembering, or making decisions?: Yes (some) Patient able to express need for assistance with ADLs?: No Does the patient have difficulty dressing or bathing?: No Independently performs ADLs?: Yes (appropriate for developmental age) Does the patient have difficulty walking or climbing stairs?: No Weakness of Legs: None Weakness of Arms/Hands: None  Permission Sought/Granted Permission sought to share information with : Other (comment) Permission granted to share information with : Yes, Verbal Permission Granted     Permission granted to share info w AGENCY: DMe agency and [facility] agency        Emotional Assessment Appearance:: Appears stated age Attitude/Demeanor/Rapport: Engaged Affect (typically observed): Pleasant, Appropriate Orientation: : Oriented to Self, Oriented to Place, Oriented to  Time, Oriented to Situation Alcohol / Substance Use: Not Applicable Psych Involvement: No (comment)  Admission diagnosis:  Total knee replacement status [Z96.659] 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   Primary osteoarthritis of left knee 11/08/2020   Pure hypercholesterolemia 05/04/2020   Lumbar radiculopathy 02/06/2018   Mild episode of recurrent major depressive disorder ([place]) 08/08/2017   Hx of renal cell carcinoma 03/22/2017   Fracture of multiple ribs of left  side 11/14/2016   Osteoarthritis 11/14/2016   Pneumothorax on left 11/14/2016   Rheumatoid arthritis ([place]) 11/14/2016   Attention deficit hyperactivity  disorder (ADHD), predominantly inattentive type 03/19/2016   Leukocytosis 10/10/2014   Hypokalemia 10/10/2014   Cat bite of right forearm 10/08/2014   Cellulitis of arm, right 10/08/2014   Depression 10/08/2014   HLD (hyperlipidemia) 10/08/2014   HTN (hypertension) 10/08/2014   PCP:  Dion Body, MD Pharmacy:   [id number] - [place], [place] [address] [place] [address] Phone: [phone] Fax: [phone]     Social Determinants of Health (SDOH) Interventions    Readmission Risk Interventions No flowsheet data found.

## 2020-11-27 NOTE — Progress Notes (Signed)
Occupational Therapy Treatment Patient Details Name: Meghan Myers MRN: [id number] DOB: April 04, 1950 Today's Date: 11/27/2020   History of present illness Pt is a 70 y/o F with PMH: Degenerative arthrosis of the left knee (s/p arthroscopy ~6MA). Now s/p elective L TKR on 9/28.   OT comments  Pt seen for OT tx this date to f/u re: safety with ADLs/ADL mobility. Pt generally improving with ADL transfers, only requiring SUPV for safety cueing with 2WW use this date. Pt able to perform LB dressing with SETUP/SUPV for STS clothing mgt over hips. While donning socks is difficult for pt, she can perform with modified technique bending at the waist as advised by OT to reduce knee pain. Extended time spent on below-listed education. Will continue to follow. Pt left with all needs met and in reach.    Recommendations for follow up therapy are one component of a multi-disciplinary discharge planning process, led by the attending physician.  Recommendations may be updated based on patient status, additional functional criteria and insurance authorization.    Follow Up Recommendations  Home health OT    Equipment Recommendations  3 in 1 bedside commode;Other (comment);Tub/shower seat    Recommendations for Other Services      Precautions / Restrictions Precautions Precautions: Fall Restrictions Weight Bearing Restrictions: Yes LLE Weight Bearing: Weight bearing as tolerated       Mobility Bed Mobility Overal bed mobility: Modified Independent                  Transfers Overall transfer level: Modified independent Equipment used: Rolling walker (2 wheeled) Transfers: Sit to/from Stand Sit to Stand: Supervision         General transfer comment: minimal verbal/visual cueing for correct sequence of STS with RW use. Otherwise no physical assistance to come to standing.    Balance Overall balance assessment: Modified Independent Sitting-balance support: Feet supported Sitting  balance-Leahy Scale: Normal       Standing balance-Leahy Scale: Good Standing balance comment: benefits from UE support. but does not require to complete static standing tasks.                           ADL either performed or assessed with clinical judgement   ADL Overall ADL's : Needs assistance/impaired     Grooming: Wash/dry face;Oral care;Brushing hair;Supervision/safety;Set up;Standing Grooming Details (indicate cue type and reason): with RW, sink-side         Upper Body Dressing : Independent;Sitting   Lower Body Dressing: Supervision/safety;Sit to/from stand Lower Body Dressing Details (indicate cue type and reason): with RW             Functional mobility during ADLs: Supervision/safety;Rolling walker (~4-5 steps to/from sink for ADLs.)       Vision Baseline Vision/History: 1 Wears glasses Ability to See in Adequate Light: 0 Adequate Patient Visual Report: No change from baseline     Perception     Praxis      Cognition Arousal/Alertness: Awake/alert Behavior During Therapy: WFL for tasks assessed/performed Overall Cognitive Status: Within Functional Limits for tasks assessed                                          Exercises Exercises: Total Joint Total Joint Exercises Ankle Circles/Pumps: AROM;10 reps Quad Sets: Strengthening;10 reps Short Arc Quad: Strengthening;15 reps Heel Slides: Strengthening;10 reps (with  resisted leg ext) Hip ABduction/ADduction: Strengthening;10 reps Straight Leg Raises: AROM;10 reps;Strengthening Knee Flexion: PROM;5 reps Goniometric ROM: 0-111 (>100 AROM) Other Exercises Other Exercises: Education including providing handout re: polar care mgt, compression stocking mgt, safety/fall prevention considerations, bathing considerations. Pt with good undersatnding.   Shoulder Instructions       General Comments      Pertinent Vitals/ Pain       Pain Assessment: 0-10 Pain Score: 4  Pain  Location: L knee Pain Descriptors / Indicators: Aching;Guarding;Operative site guarding Pain Intervention(s): Limited activity within patient's tolerance;Monitored during session;Premedicated before session  Home Living                                          Prior Functioning/Environment              Frequency  Min 1X/week        Progress Toward Goals  OT Goals(current goals can now be found in the care plan section)  Progress towards OT goals: Progressing toward goals  Acute Rehab OT Goals Patient Stated Goal: go home OT Goal Formulation: With patient/family Time For Goal Achievement: 12/10/20 Potential to Achieve Goals: Good  Plan Discharge plan remains appropriate    Co-evaluation                 AM-PAC OT "6 Clicks" Daily Activity     Outcome Measure   Help from another person eating meals?: None Help from another person taking care of personal grooming?: A Little Help from another person toileting, which includes using toliet, bedpan, or urinal?: A Little Help from another person bathing (including washing, rinsing, drying)?: A Little Help from another person to put on and taking off regular upper body clothing?: None Help from another person to put on and taking off regular lower body clothing?: A Little 6 Click Score: 20    End of Session Equipment Utilized During Treatment: Gait belt;Rolling walker  OT Visit Diagnosis: Unsteadiness on feet (R26.81);Muscle weakness (generalized) (M62.81);Pain Pain - Right/Left: Left Pain - part of body: Knee   Activity Tolerance Patient tolerated treatment well   Patient Left in bed;with call bell/phone within reach   Nurse Communication Mobility status;Patient requests pain meds        Time: 0601-5615 OT Time Calculation (min): 59 min  Charges: OT General Charges $OT Visit: 1 Visit OT Treatments $Self Care/Home Management : 23-37 mins $Therapeutic Activity: 23-37 mins   Gerrianne Scale, MS, OTR/L ascom [phone] 11/27/20, 1:21 PM

## 2020-11-27 NOTE — Anesthesia Postprocedure Evaluation (Signed)
Anesthesia Post Note  Patient: Meghan Myers  Procedure(s) Performed: COMPUTER ASSISTED TOTAL KNEE ARTHROPLASTY - RNFA (Left: Knee)  Patient location during evaluation: Nursing Unit Anesthesia Type: Spinal Level of consciousness: oriented and awake and alert Pain management: pain level controlled Vital Signs Assessment: post-procedure vital signs reviewed and stable Respiratory status: spontaneous breathing Cardiovascular status: blood pressure returned to baseline and stable Postop Assessment: no headache, no backache, patient able to bend at knees and able to ambulate Anesthetic complications: no   No notable events documented.   Last Vitals:  Vitals:   11/27/20 0450 11/27/20 0753  BP: (!) 120/52 (!) 125/52  Pulse: 75 79  Resp: 17 16  Temp: 36.8 C 36.4 C  SpO2: 96% 99%    Last Pain:  Vitals:   11/27/20 0757  TempSrc:   PainSc: 5                  Precious Haws Aidan Caloca

## 2020-11-27 NOTE — Progress Notes (Addendum)
  Subjective: 1 Day Post-Op Procedure(s) (LRB): COMPUTER ASSISTED TOTAL KNEE ARTHROPLASTY - RNFA (Left) Patient reports pain as mild.   Patient is well, and has had no acute complaints or problems Plan is to go Home after hospital stay. Negative for chest pain and shortness of breath Fever: no Gastrointestinal: Negative for nausea and vomiting  Objective: Vital signs in last 24 hours: Temp:  [97.6 F (36.4 C)-98.8 F (37.1 C)] 98.2 F (36.8 C) (09/29 0450) Pulse Rate:  [68-75] 75 (09/29 0450) Resp:  [13-17] 17 (09/29 0450) BP: (113-141)/(49-67) 120/52 (09/29 0450) SpO2:  [96 %-100 %] 96 % (09/29 0450)  Intake/Output from previous day:  Intake/Output Summary (Last 24 hours) at 11/27/2020 0658 Last data filed at 11/27/2020 0500 Gross per 24 hour  Intake 899.97 ml  Output 606 ml  Net 293.97 ml    Intake/Output this shift: Total I/O In: 800 [I.V.:800] Out: 110 [Drains:110]  Labs: No results for input(s): HGB in the last 72 hours. No results for input(s): WBC, RBC, HCT, PLT in the last 72 hours. No results for input(s): NA, K, CL, CO2, BUN, CREATININE, GLUCOSE, CALCIUM in the last 72 hours. No results for input(s): LABPT, INR in the last 72 hours.   EXAM General - Patient is Alert and Oriented Extremity - Neurovascular intact Sensation intact distally Dorsiflexion/Plantar flexion intact Compartment soft Dressing/Incision - clean, dry, with the Hemovac tubing removed with no complication.  The Hemovac tubing was intact on removal. Motor Function - intact, moving foot and toes well on exam.  Able to do a straight leg raise independently.  Ambulated 100 feet last night.  Past Medical History:  Diagnosis Date   Cancer ([place]) 10/2016   kidney ca   Chronic kidney disease    Depression    History of kidney stones    HLD (hyperlipidemia)    Hypertension    Liver disease    Osteoarthritis    Rheumatoid arthritis ([place])    Shingles    Squamous cell carcinoma of skin  01/27/2015   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     Assessment/Plan: 1 Day Post-Op Procedure(s) (LRB): COMPUTER ASSISTED TOTAL KNEE ARTHROPLASTY - RNFA (Left) Active Problems:   Total knee replacement status  Estimated body mass index is 22.67 kg/m as calculated from the following:   Height as of this encounter: 5' 0.5" (1.537 m).   Weight as of this encounter: 53.5 kg. Advance diet Up with therapy D/C IV fluids Discharge home with home health  DVT Prophylaxis - Lovenox, Foot Pumps, and TED hose Weight-Bearing as tolerated to left leg  Reche Dixon, PA-C Orthopaedic Surgery 11/27/2020, 6:58 AM

## 2020-11-27 NOTE — Progress Notes (Signed)
Physical Therapy Treatment Patient Details Name: Meghan Myers MRN: [id number] DOB: 07/16/1950 Today's Date: 11/27/2020   History of Present Illness Pt is a 70 y/o F with PMH: Degenerative arthrosis of the left knee (s/p arthroscopy ~6MA). Now s/p elective L TKR on 9/28.    PT Comments    Pt did very well with POD1 PT session.  She was able to do all mobility w/o assist and needed minimal cuing, she easily circumambulated the nurses' station w/ appropriate cadence and walker use, she showed good safety and understanding with stair training, had >100 degrees of AROM flexion (PROM 0-111) and had moderate pain t/o the session.  Pt making good improvements and tolerated activity safety and w/o issue.     Recommendations for follow up therapy are one component of a multi-disciplinary discharge planning process, led by the attending physician.  Recommendations may be updated based on patient status, additional functional criteria and insurance authorization.  Follow Up Recommendations  Follow surgeon's recommendation for DC plan and follow-up therapies     Equipment Recommendations  Rolling walker with 5" wheels;3in1 (PT)    Recommendations for Other Services       Precautions / Restrictions Precautions Precautions: Fall Restrictions Weight Bearing Restrictions: Yes LLE Weight Bearing: Weight bearing as tolerated     Mobility  Bed Mobility Overal bed mobility: Modified Independent                  Transfers Overall transfer level: Modified independent Equipment used: Rolling walker (2 wheeled) Transfers: Sit to/from Stand Sit to Stand: Supervision         General transfer comment: Physically she is able to rise w/o issue, did need reminders to insure appropriate set up, UE/AD use  Ambulation/Gait Ambulation/Gait assistance: Supervision Gait Distance (Feet): 250 Feet Assistive device: Rolling walker (2 wheeled)       General Gait Details: Pt was able to assume  consistent cadence w/ minimal UE reliance and easily circumambulated the nurses' station.  Very little L LE WBing hesitation with improved confidence and cadence with increased distance.   Stairs Stairs: Yes Stairs assistance: Supervision Stair Management: No rails;Backwards;With walker Number of Stairs: 8 General stair comments: Pt negotiated up/down 4 steps X 2, on the second trial with very little cuing and husband providing equipment management/guarding.  Pt with good execution, no safety issues and feeling confident/safe with stair negotiation   Wheelchair Mobility    Modified Rankin (Stroke Patients Only)       Balance Overall balance assessment: Modified Independent Sitting-balance support: Feet supported Sitting balance-Leahy Scale: Good       Standing balance-Leahy Scale: Good                              Cognition Arousal/Alertness: Awake/alert Behavior During Therapy: WFL for tasks assessed/performed Overall Cognitive Status: Within Functional Limits for tasks assessed                                        Exercises Total Joint Exercises Ankle Circles/Pumps: AROM;10 reps Quad Sets: Strengthening;10 reps Short Arc Quad: Strengthening;15 reps Heel Slides: Strengthening;10 reps (with resisted leg ext) Hip ABduction/ADduction: Strengthening;10 reps Straight Leg Raises: AROM;10 reps;Strengthening Knee Flexion: PROM;5 reps Goniometric ROM: 0-111 (>100 AROM)    General Comments        Pertinent Vitals/Pain Pain Assessment: 0-10  Pain Score: 4  Pain Location: L knee    Home Living                      Prior Function            PT Goals (current goals can now be found in the care plan section) Progress towards PT goals: Progressing toward goals    Frequency    BID      PT Plan Current plan remains appropriate    Co-evaluation              AM-PAC PT "6 Clicks" Mobility   Outcome Measure  Help  needed turning from your back to your side while in a flat bed without using bedrails?: None Help needed moving from lying on your back to sitting on the side of a flat bed without using bedrails?: None Help needed moving to and from a bed to a chair (including a wheelchair)?: None Help needed standing up from a chair using your arms (e.g., wheelchair or bedside chair)?: None Help needed to walk in hospital room?: None Help needed climbing 3-5 steps with a railing? : A Little 6 Click Score: 23    End of Session Equipment Utilized During Treatment: Gait belt Activity Tolerance: Patient tolerated treatment well Patient left: with chair alarm set;with call bell/phone within reach;with family/visitor present Nurse Communication: Mobility status PT Visit Diagnosis: Muscle weakness (generalized) (M62.81);Difficulty in walking, not elsewhere classified (R26.2);Pain Pain - Right/Left: Left Pain - part of body: Knee     Time: 0752-0832 PT Time Calculation (min) (ACUTE ONLY): 40 min  Charges:  $Gait Training: 8-22 mins $Therapeutic Exercise: 8-22 mins $Therapeutic Activity: 8-22 mins                     Kreg Shropshire, DPT 11/27/2020, 11:22 AM

## 2021-03-03 ENCOUNTER — Encounter: Payer: Medicare Other | Admitting: Dermatology

## 2021-03-16 ENCOUNTER — Emergency Department: Payer: Medicare Other

## 2021-03-16 ENCOUNTER — Emergency Department
Admission: EM | Admit: 2021-03-16 | Discharge: 2021-03-16 | Disposition: A | Discharge status: Home / Self Care | Payer: Medicare Other | Attending: Emergency Medicine | Admitting: Emergency Medicine

## 2021-03-16 ENCOUNTER — Other Ambulatory Visit: Payer: Self-pay

## 2021-03-16 ENCOUNTER — Encounter: Payer: Self-pay | Admitting: Radiology

## 2021-03-16 DIAGNOSIS — Z85828 Personal history of other malignant neoplasm of skin: Secondary | ICD-10-CM | POA: Insufficient documentation

## 2021-03-16 DIAGNOSIS — M25552 Pain in left hip: Secondary | ICD-10-CM | POA: Insufficient documentation

## 2021-03-16 DIAGNOSIS — M7989 Other specified soft tissue disorders: Secondary | ICD-10-CM | POA: Insufficient documentation

## 2021-03-16 DIAGNOSIS — I129 Hypertensive chronic kidney disease with stage 1 through stage 4 chronic kidney disease, or unspecified chronic kidney disease: Secondary | ICD-10-CM | POA: Diagnosis not present

## 2021-03-16 DIAGNOSIS — Z85528 Personal history of other malignant neoplasm of kidney: Secondary | ICD-10-CM | POA: Diagnosis not present

## 2021-03-16 DIAGNOSIS — Z79899 Other long term (current) drug therapy: Secondary | ICD-10-CM | POA: Diagnosis not present

## 2021-03-16 DIAGNOSIS — N189 Chronic kidney disease, unspecified: Secondary | ICD-10-CM | POA: Insufficient documentation

## 2021-03-16 DIAGNOSIS — Z96652 Presence of left artificial knee joint: Secondary | ICD-10-CM | POA: Insufficient documentation

## 2021-03-16 MED ORDER — HYDROCODONE-ACETAMINOPHEN 5-325 MG PO TABS
2.0000 | ORAL_TABLET | Freq: Once | ORAL | Status: AC
  Administered 2021-03-16: 2 via ORAL
  Filled 2021-03-16: qty 2

## 2021-03-16 MED ORDER — ONDANSETRON 4 MG PO TBDP
4.0000 mg | ORAL_TABLET | Freq: Once | ORAL | Status: AC
  Administered 2021-03-16: 4 mg via ORAL
  Filled 2021-03-16: qty 1

## 2021-03-16 MED ORDER — MORPHINE SULFATE (PF) 4 MG/ML IV SOLN
4.0000 mg | Freq: Once | INTRAVENOUS | Status: AC
  Administered 2021-03-16: 4 mg via INTRAMUSCULAR
  Filled 2021-03-16: qty 1

## 2021-03-16 MED ORDER — HYDROCODONE-ACETAMINOPHEN 5-325 MG PO TABS: 1.0000 | ORAL_TABLET | ORAL | 0 refills | Status: DC | PRN

## 2021-03-16 NOTE — ED Provider Notes (Signed)
[facility] Provider Note    Event Date/Time   First MD Initiated Contact with Patient 03/16/21 [phone]     (approximate)   History   Hip Pain   HPI  Meghan Myers is a 71 y.o. female with history of hypertension, hyperlipidemia, chronic kidney disease, rheumatoid arthritis who presents to the emergency department with 3 days of left hip pain.  No known injury.  Has been ambulatory.  Had total knee replacement on the left by Dr. Marry Guan in September.  States she saw Dr. Marry Guan on Friday January 13.   History provided by patient and husband.      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     Past Surgical History:  Procedure Laterality Date   BACK SURGERY     L5 hemilaminectomy   BREAST EXCISIONAL BIOPSY Right 2000?   benign   BUNIONECTOMY Bilateral    FACIAL FRACTURE SURGERY     FACIAL LACERATION REPAIR     FOOT SURGERY     from childhood accident   KIDNEY SURGERY     KNEE ARTHROPLASTY Left 11/26/2020   Procedure: COMPUTER ASSISTED TOTAL KNEE ARTHROPLASTY - RNFA;  Surgeon: Dereck Leep, MD;  Location: [facility] ORS;  Service: Orthopedics;  Laterality: Left;   KNEE ARTHROSCOPY Left 05/05/2020   Procedure: ARTHROSCOPY KNEE;  Surgeon: Dereck Leep, MD;  Location: [facility] ORS;  Service: Orthopedics;  Laterality: Left;   LUMBAR LAMINECTOMY/DECOMPRESSION MICRODISCECTOMY Right 02/06/2018   Procedure: LUMBAR HEMI-LAMINECTOMY AND FORAMINOTOMYWITH DISCECTOMY 1 LEVEL L4-5;  Surgeon: Deetta Perla, MD;  Location: [facility] ORS;  Service: Neurosurgery;  Laterality: Right;   ROTATOR CUFF REPAIR Right    TONSILLECTOMY     TRIGGER FINGER RELEASE Bilateral     URETHRAL DILATION     WRIST SURGERY      MEDICATIONS:  Prior to Admission medications   Medication Sig Start Date End Date Taking? Authorizing Provider  acetaminophen (TYLENOL) 500 MG tablet Take 500 mg by mouth in the morning and at bedtime.    [provider]  amLODipine (NORVASC) 5 MG tablet Take 5 mg by mouth daily. 01/28/20   [provider]  amphetamine-dextroamphetamine (ADDERALL XR) 25 MG 24 hr capsule Take 25 mg by mouth every morning.    [provider]  atorvastatin (LIPITOR) 20 MG tablet Take 20 mg by mouth daily.    [provider]  Calcium Carbonate (CALCIUM 600 PO) Take 600 mg by mouth daily.    [provider]  celecoxib (CELEBREX) 200 MG capsule Take 1 capsule (200 mg total) by mouth 2 (two) times daily. 11/27/20   Reche Dixon, PA-C  enoxaparin (LOVENOX) 40 MG/0.4ML injection Inject 0.4 mLs (40 mg total) into the skin daily for 14 days. 11/27/20 12/11/20  Reche Dixon, PA-C  hydrochlorothiazide (HYDRODIURIL) 25 MG tablet Take 25 mg by mouth daily.    [provider]  Multiple Vitamin (MULTIVITAMIN WITH MINERALS) TABS tablet Take 1 tablet by mouth daily.    [provider]  Omega-3 Fatty Acids (FISH OIL) 1000 MG CAPS Take 1,000 mg by  mouth daily.    [provider]  oxyCODONE (OXY IR/ROXICODONE) 5 MG immediate release tablet Take 1 tablet (5 mg total) by mouth every 4 (four) hours as needed for moderate pain (pain score 4-6). 11/27/20   Reche Dixon, PA-C  sertraline (ZOLOFT) 100 MG tablet Take 150 mg by mouth daily.    [provider]  traMADol (ULTRAM) 50 MG tablet Take 1-2 tablets (50-100 mg total) by mouth every 4 (four) hours as needed for moderate pain. 11/27/20   Reche Dixon, PA-C    Physical Exam   Triage Vital Signs: ED Triage Vitals  Enc Vitals Group     BP 03/16/21 0509 (!) 181/80     Pulse Rate 03/16/21 0509 79     Resp 03/16/21 0509 16     Temp 03/16/21 0509 97.9 F (36.6 C)      Temp Source 03/16/21 0509 Oral     SpO2 03/16/21 0509 100 %     Weight 03/16/21 0515 117 lb 15.1 oz (53.5 kg)     Height --      Head Circumference --      Peak Flow --      Pain Score --      Pain Loc --      Pain Edu? --      Excl. in [place]? --     Most recent vital signs: Vitals:   03/16/21 0509  BP: (!) 181/80  Pulse: 79  Resp: 16  Temp: 97.9 F (36.6 C)  SpO2: 100%    CONSTITUTIONAL: Alert and oriented and responds appropriately to questions. Well-appearing; well-nourished HEAD: Normocephalic, atraumatic EYES: Conjunctivae clear, pupils appear equal, sclera nonicteric ENT: normal nose; moist mucous membranes NECK: Supple, normal ROM CARD: RRR; S1 and S2 appreciated; no murmurs, no clicks, no rubs, no gallops RESP: Normal chest excursion without splinting or tachypnea; breath sounds clear and equal bilaterally; no wheezes, no rhonchi, no rales, no hypoxia or respiratory distress, speaking full sentences ABD/GI: Normal bowel sounds; non-distended; soft, non-tender, no rebound, no guarding, no peritoneal signs BACK: The back appears normal EXT: Normal ROM in all joints; no deformity noted, no edema; no cyanosis, full range of motion of the left hip without any signs of distress, 2+ left DP pulse.  No calf tenderness or calf swelling.  No redness or warmth.  Normal capillary refill.  Extremity warm and well-perfused.  Normal sensation. SKIN: Normal color for age and race; warm; no rash on exposed skin NEURO: Moves all extremities equally, normal speech PSYCH: The patient's mood and manner are appropriate.   ED Results / Procedures / Treatments   LABS: (all labs ordered are listed, but only abnormal results are displayed) Labs Reviewed - No data to display   EKG:   RADIOLOGY: My personal review and interpretation of imaging: X-ray shows no fracture, dislocation.  Ultrasound pending.  I have personally reviewed all radiology reports.   DG Hip Unilat With Pelvis 2-3  Views Left  Result Date: 03/16/2021 CLINICAL DATA:  Pain. EXAM: DG HIP (WITH OR WITHOUT PELVIS) 2-3V LEFT COMPARISON:  None. FINDINGS: There is no evidence of hip fracture or dislocation. There is no evidence of arthropathy or other focal bone abnormality. Degenerative disc disease noted within the imaged portions of the lumbar spine. IMPRESSION: 1. No acute abnormality. 2. Lumbar spondylosis noted. Electronically Signed   By: Kerby Moors M.D.   On: 03/16/2021 06:29     PROCEDURES:  Critical Care performed: No   CRITICAL CARE  Performed by: Pryor Curia   Total critical care time: 0 minutes  Critical care time was exclusive of separately billable procedures and treating other patients.  Critical care was necessary to treat or prevent imminent or life-threatening deterioration.  Critical care was time spent personally by me on the following activities: development of treatment plan with patient and/or surrogate as well as nursing, discussions with consultants, evaluation of patient's response to treatment, examination of patient, obtaining history from patient or surrogate, ordering and performing treatments and interventions, ordering and review of laboratory studies, ordering and review of radiographic studies, pulse oximetry and re-evaluation of patient's condition.   Procedures    IMPRESSION / MDM / ASSESSMENT AND PLAN / ED COURSE  I reviewed the triage vital signs and the nursing notes.    Patient here with left hip pain.  No known injury.  Is ambulatory.     DIFFERENTIAL DIAGNOSIS (includes but not limited to):   Osteoarthritis, fracture, doubt dislocation.  No signs of septic arthritis, gout, cellulitis, compartment syndrome, arterial obstruction on exam.  DVT is also on the differential given she states pain goes into the left anterior thigh.   PLAN: We will obtain x-ray of the left hip, venous Doppler.  Will give pain medication.   MEDICATIONS GIVEN IN  ED: Medications  morphine 4 MG/ML injection 4 mg (has no administration in time range)  HYDROcodone-acetaminophen (NORCO/VICODIN) 5-325 MG per tablet 2 tablet (2 tablets Oral Given 03/16/21 0637)  ondansetron (ZOFRAN-ODT) disintegrating tablet 4 mg (4 mg Oral Given 03/16/21 0258)     ED COURSE: X-ray of the left hip reviewed by myself and radiology and shows no fracture or dislocation.  Ultrasound pending.  She reports no relief in pain after 2 Vicodin tablets.  Will give IM morphine.  7:50 AM Signed out the oncoming ED physician to follow-up on patient's ultrasound report.   I reviewed all nursing notes and pertinent previous records as available.  I have reviewed and interpreted any EKGs, lab and urine results, imaging (as available).  CONSULTS: Patient may need orthopedic consult depending on imaging results.   OUTSIDE RECORDS REVIEWED: Reviewed recent orthopedic note on 03/13/2021.  Patient was given exercises for her left knee.  There is no discussion of patient's left hip pain.  Patient denied to me any injury however per the orthopedic note it stated that she did fall several days ago while going into the crawl space of her attic.         FINAL CLINICAL IMPRESSION(S) / ED DIAGNOSES   Final diagnoses:  Left hip pain     Rx / DC Orders   ED Discharge Orders     None        Note:  This document was prepared using Dragon voice recognition software and may include unintentional dictation errors.   Naiara Lombardozzi, Delice Bison, DO 03/16/21 [phone]

## 2021-03-16 NOTE — ED Triage Notes (Signed)
Pt is able to bear weight and ambulate.

## 2021-03-16 NOTE — ED Triage Notes (Signed)
Pt states she has been having intermittent left hip pain for the past three days. States she does not remember injuring the hip from any recent fall.

## 2021-03-30 ENCOUNTER — Ambulatory Visit (INDEPENDENT_AMBULATORY_CARE_PROVIDER_SITE_OTHER): Payer: Medicare Other | Admitting: Dermatology

## 2021-03-30 ENCOUNTER — Other Ambulatory Visit: Payer: Self-pay

## 2021-03-30 DIAGNOSIS — D229 Melanocytic nevi, unspecified: Secondary | ICD-10-CM

## 2021-03-30 DIAGNOSIS — L820 Inflamed seborrheic keratosis: Secondary | ICD-10-CM

## 2021-03-30 DIAGNOSIS — D1800 Hemangioma unspecified site: Secondary | ICD-10-CM

## 2021-03-30 DIAGNOSIS — L603 Nail dystrophy: Secondary | ICD-10-CM

## 2021-03-30 DIAGNOSIS — L578 Other skin changes due to chronic exposure to nonionizing radiation: Secondary | ICD-10-CM

## 2021-03-30 DIAGNOSIS — L570 Actinic keratosis: Secondary | ICD-10-CM | POA: Diagnosis not present

## 2021-03-30 DIAGNOSIS — L650 Telogen effluvium: Secondary | ICD-10-CM

## 2021-03-30 DIAGNOSIS — L720 Epidermal cyst: Secondary | ICD-10-CM

## 2021-03-30 DIAGNOSIS — D692 Other nonthrombocytopenic purpura: Secondary | ICD-10-CM

## 2021-03-30 DIAGNOSIS — Z85828 Personal history of other malignant neoplasm of skin: Secondary | ICD-10-CM

## 2021-03-30 DIAGNOSIS — L814 Other melanin hyperpigmentation: Secondary | ICD-10-CM

## 2021-03-30 DIAGNOSIS — I781 Nevus, non-neoplastic: Secondary | ICD-10-CM | POA: Diagnosis not present

## 2021-03-30 DIAGNOSIS — L821 Other seborrheic keratosis: Secondary | ICD-10-CM

## 2021-03-30 MED ORDER — FLUCONAZOLE 200 MG PO TABS
200.0000 mg | ORAL_TABLET | Freq: Every day | ORAL | 0 refills | Status: DC
Start: 2021-03-30 — End: 2021-10-06

## 2021-03-30 NOTE — Progress Notes (Signed)
Follow-Up Visit   Subjective  Meghan Myers is a 71 y.o. female who presents for the following: Annual Exam (Patient here for full body skin exam and skin cancer screening. Patient with hx of SCCis. Patient does have some changes in fingernails and toenails. Patient with a spot at right cheek that she does not like, not new and has been evaluated in the past. Patient also with some scaliness at left lower lip. ).  She has several scaly bumps that are irritating.  She also has had a lot of hair loss over past several months.  She had a knee surgery end of September 22 and hair loss started 2-3 months later.  The shedding came out in handfuls, but now it has subsided, but hair still thin.   The following portions of the chart were reviewed this encounter and updated as appropriate:       Review of Systems:  No other skin or systemic complaints except as noted in HPI or Assessment and Plan.  Objective  Well appearing patient in no apparent distress; mood and affect are within normal limits.  A full examination was performed including scalp, head, eyes, ears, nose, lips, neck, chest, axillae, abdomen, back, buttocks, bilateral upper extremities, bilateral lower extremities, hands, feet, fingers, toes, fingernails, and toenails. All findings within normal limits unless otherwise noted below.  fingernails, toenails Bilateral thumbs, right index, right > left great toenail with onycholysis, yellow/white discoloration.              Scalp Thinning at frontal temporal hairline and at crown, negative hair pull test.   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  Pt has tried topical treatment (Jublia samples) in past on toenails, didn't help.  Insurance did not cover it.  Most recent labs 1/23 reviewed.  LFTs nl  Start fluconazole 200 mg once weekly for 7-9 mo course. Will send in as daily. Patient given written instructions to take once weekly and on that day to not take her lipitor.   Side effects of fluconazole (diflucan) include nausea, diarrhea, headache, dizziness, taste changes, rare risk of irritation of the liver, allergy, or decreased blood counts (which could show up as infection or tiredness).   fluconazole (DIFLUCAN) 200 MG tablet - fingernails, toenails Take 1 tablet (200 mg total) by mouth daily.  Telogen effluvium Scalp  Due to knee surgery 9/22, improving now, observation  Telogen effluvium is a benign, self-limited condition causing increased hair shedding usually for several months. It does not progress to baldness, and the hair eventually grows back on its own. It can be triggered by recent illness, recent surgery, thyroid disease, low iron stores, vitamin D deficiency, fad diets or rapid weight loss, hormonal changes such as pregnancy or birth control pills, and some medication. Usually the hair loss starts 2-3 months after the illness or health change. Rarely, it can continue for longer than a year.    Inflamed seborrheic keratosis (3) left upper knee x 1,  right foot dorsum x 1, residual at right chest x 1  Destruction of lesion - left upper knee x 1, right foot dorsum x 1, residual at right chest x 1 Complexity: simple   Destruction method: cryotherapy   Informed consent: discussed and consent obtained   Lesion destroyed using liquid nitrogen: Yes   Region frozen until ice ball extended beyond lesion: Yes   Outcome: patient tolerated procedure well with no  complications   Post-procedure details: wound care instructions given    Milia right cheek  Benign-appearing.  Observation.  Call clinic for new or changing lesions.  Recommend daily use of broad spectrum spf 30+ sunscreen to sun-exposed areas.    AK (actinic keratosis) left lower lip at vermillion x 1  Actinic keratoses are precancerous spots that appear secondary to cumulative UV radiation exposure/sun exposure over time. They are chronic with expected duration over 1 year. A portion of actinic keratoses will progress to squamous cell carcinoma of the skin. It is not possible to reliably predict which spots will progress to skin cancer and so treatment is recommended to prevent development of skin cancer.  Recommend daily broad spectrum sunscreen SPF 30+ to sun-exposed areas, reapply every 2 hours as needed.  Recommend staying in the shade or wearing long sleeves, sun glasses (UVA+UVB protection) and wide brim hats (4-inch brim around the entire circumference of the hat). Call for new or changing lesions.   Destruction of lesion - left lower lip at vermillion x 1  Destruction method: cryotherapy   Informed consent: discussed and consent obtained   Lesion destroyed using liquid nitrogen: Yes   Region frozen until ice ball extended beyond lesion: Yes   Outcome: patient tolerated procedure well with no complications   Post-procedure details: wound care instructions given   Additional details:  Prior to procedure, discussed risks of blister formation, small wound, skin dyspigmentation, or rare scar following cryotherapy. Recommend Vaseline ointment to treated areas while healing.   Telangiectasia left upper lip  Benign-appearing.  Observation.  Call clinic for new or changing lesions.  Recommend daily use of broad spectrum spf 30+ sunscreen to sun-exposed areas.   Recheck on f/up.  RTC if changes  Seborrheic keratosis left posterior upper thigh  Reassured benign age-related  growth.  Recommend observation.  Discussed cryotherapy if spot(s) become irritated or inflamed.    Lentigines - Scattered tan macules - Due to sun exposure - Benign-appearing, observe - Recommend daily broad spectrum sunscreen SPF 30+ to sun-exposed areas, reapply every 2 hours as needed. - Call for any changes  Seborrheic Keratoses - Stuck-on, waxy, tan-brown papules and/or plaques  - Benign-appearing - Discussed benign etiology and prognosis. - Observe - Call for any changes  Melanocytic Nevi - Tan-brown and/or pink-flesh-colored symmetric macules and papules - Benign appearing on exam today - Observation - Call clinic for new or changing moles - Recommend daily use of broad spectrum spf 30+ sunscreen to sun-exposed areas.   Hemangiomas - Red papules - Discussed benign nature - Observe - Call for any changes  Actinic Damage - Chronic condition, secondary to cumulative UV/sun exposure - diffuse scaly erythematous macules with underlying dyspigmentation - Recommend daily broad spectrum sunscreen SPF 30+ to sun-exposed areas, reapply every 2 hours as needed.  - Staying in the shade or wearing long sleeves, sun glasses (UVA+UVB protection) and wide brim hats (4-inch brim around the entire circumference of the hat) are also recommended for sun protection.  - Call for new or changing lesions.  Skin cancer screening performed today.  History of Squamous Cell Carcinoma in Situ of the Skin - No evidence of recurrence today R third finger - Recommend regular full body skin exams - Recommend daily broad spectrum sunscreen SPF 30+ to sun-exposed areas, reapply every 2 hours as needed.  - Call if any new or changing lesions are noted between office visits  Purpura - Chronic; persistent and recurrent.  Treatable, but not curable. - Violaceous macules and patches - Benign - Related to trauma, age, sun damage and/or use of blood thinners, chronic use of topical and/or oral  steroids - Observe - Can use OTC arnica containing moisturizer such as Dermend Bruise Formula if desired - Call for worsening or other concerns  Return in about 1 year (around 03/30/2022) for TBSE, 3-4 months to recheck nails, L lip.  Graciella Belton, RMA, am acting as scribe for Brendolyn Patty, MD .  Documentation: I have reviewed the above documentation for accuracy and completeness, and I agree with the above.  Brendolyn Patty MD

## 2021-03-30 NOTE — Patient Instructions (Addendum)
Start fluconazole 200 mg once weekly. On the day you are taking fluconazole do not take your Lipitor.   Side effects of fluconazole (diflucan) include nausea, diarrhea, headache, dizziness, taste changes, rare risk of irritation of the liver, allergy, or decreased blood counts (which could show up as infection or tiredness).  Cryotherapy Aftercare  Wash gently with soap and water everyday.   Apply Vaseline and Band-Aid daily until healed.    Melanoma ABCDEs  Melanoma is the most dangerous type of skin cancer, and is the leading cause of death from skin disease.  You are more likely to develop melanoma if you: Have light-colored skin, light-colored eyes, or red or blond hair Spend a lot of time in the sun Tan regularly, either outdoors or in a tanning bed Have had blistering sunburns, especially during childhood Have a close family member who has had a melanoma Have atypical moles or large birthmarks  Early detection of melanoma is key since treatment is typically straightforward and cure rates are extremely high if we catch it early.   The first sign of melanoma is often a change in a mole or a new dark spot.  The ABCDE system is a way of remembering the signs of melanoma.  A for asymmetry:  The two halves do not match. B for border:  The edges of the growth are irregular. C for color:  A mixture of colors are present instead of an even brown color. D for diameter:  Melanomas are usually (but not always) greater than 31mm - the size of a pencil eraser. E for evolution:  The spot keeps changing in size, shape, and color.  Please check your skin once per month between visits. You can use a small mirror in front and a large mirror behind you to keep an eye on the back side or your body.   If you see any new or changing lesions before your next follow-up, please call to schedule a visit.  Please continue daily skin protection including broad spectrum sunscreen SPF 30+ to sun-exposed  areas, reapplying every 2 hours as needed when you're outdoors.    If You Need Anything After Your Visit  If you have any questions or concerns for your doctor, please call our main line at [phone] and press option 4 to reach your doctor's medical assistant. If no one answers, please leave a voicemail as directed and we will return your call as soon as possible. Messages left after 4 pm will be answered the following business day.   You may also send [place] a message via [place]. We typically respond to MyChart messages within 1-2 business days.  For prescription refills, please ask your pharmacy to contact our office. Our fax number is [phone].  If you have an urgent issue when the clinic is closed that cannot wait until the next business day, you can page your doctor at the number below.    Please note that while we do our best to be available for urgent issues outside of office hours, we are not available 24/7.   If you have an urgent issue and are unable to reach [place], you may choose to seek medical care at your doctor's office, retail clinic, urgent care center, or emergency room.  If you have a medical emergency, please immediately call 911 or go to the emergency department.  Pager Numbers  - Dr. Nehemiah Massed: [phone]  - Dr. Laurence Ferrari: [phone]  - Dr. Nicole Kindred: [phone]  In the event of inclement  weather, please call our main line at [phone] for an update on the status of any delays or closures.  Dermatology Medication Tips: Please keep the boxes that topical medications come in in order to help keep track of the instructions about where and how to use these. Pharmacies typically print the medication instructions only on the boxes and not directly on the medication tubes.   If your medication is too expensive, please contact our office at [phone] option 4 or send [place] a message through [place].   We are unable to tell what your co-pay for medications will be in  advance as this is different depending on your insurance coverage. However, we may be able to find a substitute medication at lower cost or fill out paperwork to get insurance to cover a needed medication.   If a prior authorization is required to get your medication covered by your insurance company, please allow [place] 1-2 business days to complete this process.  Drug prices often vary depending on where the prescription is filled and some pharmacies may offer cheaper prices.  The website www.goodrx.com contains coupons for medications through different pharmacies. The prices here do not account for what the cost may be with help from insurance (it may be cheaper with your insurance), but the website can give you the price if you did not use any insurance.  - You can print the associated coupon and take it with your prescription to the pharmacy.  - You may also stop by our office during regular business hours and pick up a GoodRx coupon card.  - If you need your prescription sent electronically to a different pharmacy, notify our office through [facility] or by phone at [phone] option 4.     Si Usted Necesita Algo Despus de Su Visita  Tambin puede enviarnos un mensaje a travs de Pharmacist, community. Por lo general respondemos a los mensajes de MyChart en el transcurso de 1 a 2 das hbiles.  Para renovar recetas, por favor pida a su farmacia que se ponga en contacto con nuestra oficina. Harland Dingwall de fax es [place] [phone].  Si tiene un asunto urgente cuando la clnica est cerrada y que no puede esperar hasta el siguiente da hbil, puede llamar/localizar a su doctor(a) al nmero que aparece a continuacin.   Por favor, tenga en cuenta que aunque hacemos todo lo posible para estar disponibles para asuntos urgentes fuera del horario de [place], no estamos disponibles las 24 horas del da, los 7 das de la [place].   Si tiene un problema urgente y no puede comunicarse con nosotros, puede  optar por buscar atencin mdica  en el consultorio de su doctor(a), en una clnica privada, en un centro de atencin urgente o en una sala de emergencias.  Si tiene Engineering geologist, por favor llame inmediatamente al 911 o vaya a la sala de emergencias.  Nmeros de bper  - Dr. Nehemiah Massed: [phone]  - Dra. Moye: [phone]  - Dra. Nicole Kindred: [phone]  En caso de inclemencias del [place], por favor llame a Johnsie Kindred principal al [phone] para una actualizacin sobre el [place] de cualquier retraso o cierre.  Consejos para la medicacin en dermatologa: Por favor, guarde las cajas en las que vienen los medicamentos de uso tpico para ayudarle a seguir las instrucciones sobre dnde y cmo usarlos. Las farmacias generalmente imprimen las instrucciones del medicamento slo en las cajas y no directamente en los tubos del [place].   Si su medicamento es [employer]  caro, por favor, pngase en contacto con nuestra oficina llamando al [phone] y presione la opcin 4 o envenos un mensaje a travs de Pharmacist, community.   No podemos decirle cul ser su copago por los medicamentos por adelantado ya que esto es diferente dependiendo de la cobertura de su seguro. Sin embargo, es posible que podamos encontrar un medicamento sustituto a Electrical engineer un formulario para que el seguro cubra el medicamento que se considera necesario.   Si se requiere una autorizacin previa para que su compaa de seguros [place] su medicamento, por favor permtanos de 1 a 2 das hbiles para completar este proceso.  Los precios de los medicamentos varan con frecuencia dependiendo del Environmental consultant de dnde se surte la receta y alguna farmacias pueden ofrecer precios ms baratos.  El sitio web www.goodrx.com tiene cupones para medicamentos de Airline pilot. Los precios aqu no tienen en cuenta lo que podra costar con la ayuda del seguro (puede ser ms barato con su seguro), pero el sitio web puede darle el  precio si no utiliz Research scientist (physical sciences).  - Puede imprimir el cupn correspondiente y llevarlo con su receta a la farmacia.  - Tambin puede pasar por nuestra oficina durante el horario de atencin regular y Charity fundraiser una tarjeta de cupones de GoodRx.  - Si necesita que su receta se enve electrnicamente a una farmacia diferente, informe a nuestra oficina a travs de MyChart de [place] o por telfono llamando al [phone] y presione la opcin 4.

## 2021-06-29 ENCOUNTER — Ambulatory Visit (INDEPENDENT_AMBULATORY_CARE_PROVIDER_SITE_OTHER): Payer: Medicare Other | Admitting: Dermatology

## 2021-06-29 DIAGNOSIS — Z872 Personal history of diseases of the skin and subcutaneous tissue: Secondary | ICD-10-CM | POA: Diagnosis not present

## 2021-06-29 DIAGNOSIS — L570 Actinic keratosis: Secondary | ICD-10-CM | POA: Diagnosis not present

## 2021-06-29 DIAGNOSIS — L578 Other skin changes due to chronic exposure to nonionizing radiation: Secondary | ICD-10-CM

## 2021-06-29 DIAGNOSIS — B351 Tinea unguium: Secondary | ICD-10-CM | POA: Diagnosis not present

## 2021-06-29 MED ORDER — FLUOROURACIL 5 % EX CREA: TOPICAL_CREAM | Freq: Two times a day (BID) | CUTANEOUS | 0 refills | Status: DC

## 2021-06-29 NOTE — Patient Instructions (Addendum)
Cryotherapy Aftercare ? ?Wash gently with soap and water everyday.   ?Apply Vaseline and Band-Aid daily until healed.  ? ? ?5-Fluorouracil/Calcipotriene Patient Education  ? ?Actinic keratoses are the dry, red scaly spots on the skin caused by sun damage. A portion of these spots can turn into skin cancer with time, and treating them can help prevent development of skin cancer.  ? ?Treatment of these spots requires removal of the defective skin cells. There are various ways to remove actinic keratoses, including freezing with liquid nitrogen, treatment with creams, or treatment with a blue light procedure in the office.  ? ?5-fluorouracil cream is a topical cream used to treat actinic keratoses. It works by interfering with the growth of abnormal fast-growing skin cells, such as actinic keratoses. These cells peel off and are replaced by healthy ones.  ? ?5-fluorouracil/calcipotriene is a combination of the 5-fluorouracil cream with a vitamin D analog cream called calcipotriene. The calcipotriene alone does not treat actinic keratoses. However, when it is combined with 5-fluorouracil, it helps the 5-fluorouracil treat the actinic keratoses much faster so that the same results can be achieved with a much shorter treatment time. ? ?INSTRUCTIONS FOR 5-FLUOROURACIL/CALCIPOTRIENE CREAM:  ? ?5-fluorouracil/calcipotriene cream typically only needs to be used for 4-7 days. A thin layer should be applied twice a day to the treatment areas recommended by your physician.  ? ?If your physician prescribed you separate tubes of 5-fluourouracil and calcipotriene, apply a thin layer of 5-fluorouracil followed by a thin layer of calcipotriene.  ? ?Avoid contact with your eyes, nostrils, and mouth. Do not use 5-fluorouracil/calcipotriene cream on infected or open wounds.  ? ?You will develop redness, irritation and some crusting at areas where you have pre-cancer damage/actinic keratoses. IF YOU DEVELOP PAIN, BLEEDING, OR SIGNIFICANT  CRUSTING, STOP THE TREATMENT EARLY - you have already gotten a good response and the actinic keratoses should clear up well. ? ?Wash your hands after applying 5-fluorouracil 5% cream on your skin.  ? ?A moisturizer or sunscreen with a minimum SPF 30 should be applied each morning.  ? ?Once you have finished the treatment, you can apply a thin layer of Vaseline twice a day to irritated areas to soothe and calm the areas more quickly. If you experience significant discomfort, contact your physician. ? ?For some patients it is necessary to repeat the treatment for best results. ? ?SIDE EFFECTS: When using 5-fluorouracil/calcipotriene cream, you may have mild irritation, such as redness, dryness, swelling, or a mild burning sensation. This usually resolves within 2 weeks. The more actinic keratoses you have, the more redness and inflammation you can expect during treatment. Eye irritation has been reported rarely. If this occurs, please let us know.  ?If you have any trouble using this cream, please call the office. If you have any other questions about this information, please do not hesitate to ask me before you leave the office. ? ?Start 5-fluorouracil/calcipotriene cream twice a day for 4-7 days to affected areas including nose, cheeks, upper lip. Prescription sent to [facility]. Patient provided with contact information for pharmacy and advised the pharmacy will mail the prescription to their home. Patient provided with handout reviewing treatment course and side effects and advised to call or message [place] on MyChart with any concerns. ? ?If You Need Anything After Your Visit ? ?If you have any questions or concerns for your doctor, please call our main line at [phone] and press option 4 to reach your doctor's medical assistant. If no one  answers, please leave a voicemail as directed and we will return your call as soon as possible. Messages left after 4 pm will be answered the following business day.   ? ?You may also send [place] a message via MyChart. We typically respond to MyChart messages within 1-2 business days. ? ?For prescription refills, please ask your pharmacy to contact our office. Our fax number is [phone]. ? ?If you have an urgent issue when the clinic is closed that cannot wait until the next business day, you can page your doctor at the number below.   ? ?Please note that while we do our best to be available for urgent issues outside of office hours, we are not available 24/7.  ? ?If you have an urgent issue and are unable to reach [place], you may choose to seek medical care at your doctor's office, retail clinic, urgent care center, or emergency room. ? ?If you have a medical emergency, please immediately call 911 or go to the emergency department. ? ?Pager Numbers ? ?- Dr. Nehemiah Massed: [phone] ? ?- Dr. Laurence Ferrari: [phone] ? ?- Dr. Nicole Kindred: [phone] ? ?In the event of inclement weather, please call our main line at [phone] for an update on the status of any delays or closures. ? ?Dermatology Medication Tips: ?Please keep the boxes that topical medications come in in order to help keep track of the instructions about where and how to use these. Pharmacies typically print the medication instructions only on the boxes and not directly on the medication tubes.  ? ?If your medication is too expensive, please contact our office at [phone] option 4 or send [place] a message through [place].  ? ?We are unable to tell what your co-pay for medications will be in advance as this is different depending on your insurance coverage. However, we may be able to find a substitute medication at lower cost or fill out paperwork to get insurance to cover a needed medication.  ? ?If a prior authorization is required to get your medication covered by your insurance company, please allow [place] 1-2 business days to complete this process. ? ?Drug prices often vary depending on where the prescription is filled and some  pharmacies may offer cheaper prices. ? ?The website www.goodrx.com contains coupons for medications through different pharmacies. The prices here do not account for what the cost may be with help from insurance (it may be cheaper with your insurance), but the website can give you the price if you did not use any insurance.  ?- You can print the associated coupon and take it with your prescription to the pharmacy.  ?- You may also stop by our office during regular business hours and pick up a GoodRx coupon card.  ?- If you need your prescription sent electronically to a different pharmacy, notify our office through [facility] or by phone at [phone] option 4. ? ? ? ? ?Si Usted Necesita Algo Despu?s de Su Visita ? ?Tambi?n puede enviarnos un mensaje a trav?s de MyChart. Por lo general respondemos a los mensajes de MyChart en el transcurso de 1 a 2 d?as h?biles. ? ?Para renovar recetas, por favor pida a su farmacia que se ponga en contacto con nuestra oficina. Nuestro n?mero de fax es el [phone]. ? ?Si tiene un asunto urgente cuando la cl?nica est? cerrada y que no puede esperar hasta el siguiente d?a h?bil, puede llamar/localizar a su doctor(a) al n?mero que aparece a continuaci?n.  ? ?Por favor, tenga en cuenta que  aunque hacemos todo lo posible para estar disponibles para asuntos urgentes fuera del horario de oficina, no estamos disponibles las 24 horas del d?a, los 7 d?as de la semana.  ? ?Si tiene un problema urgente y no puede comunicarse con nosotros, puede optar por buscar atenci?n m?dica  en el consultorio de su doctor(a), en una cl?nica privada, en un centro de atenci?n urgente o en una sala de emergencias. ? ?Si tiene Engineer, maintenance (IT) m?dica, por favor llame inmediatamente al 911 o vaya a la sala de emergencias. ? ?N?meros de b?per ? ?- Dr. Nehemiah Massed: [phone] ? ?- Dra. Moye: [phone] ? ?- Dra. Nicole Kindred: [phone] ? ?En caso de inclemencias del tiempo, por favor llame a nuestra l?nea  principal al [phone] para una actualizaci?n sobre el estado de cualquier retraso o cierre. ? ?Consejos para la medicaci?n en dermatolog?a: ?Por favor, guarde las Science writer

## 2021-06-29 NOTE — Progress Notes (Signed)
? ?Follow-Up Visit ?  ?Subjective  ?Meghan Myers is a 71 y.o. female who presents for the following: Follow-up (Patient here today for nail dystrophy follow up. Patient currently taking fluconazole 200 mg once weekly for 3 months and is using over the counter cream which she advises has helped nails improved. ). No side effects from medication. ? ? ?The following portions of the chart were reviewed this encounter and updated as appropriate:  ?  ?  ? ?Review of Systems:  No other skin or systemic complaints except as noted in HPI or Assessment and Plan. ? ?Objective  ?Well appearing patient in no apparent distress; mood and affect are within normal limits. ? ?A focused examination was performed including toenails, fingernails, face. Relevant physical exam findings are noted in the Assessment and Plan. ? ?left medial cheek ?Erythematous thin papules/macules with gritty scale.  ? ?fingernails, toenails ?Improvement of yellow white onycholysis, with increased clearance at base at great toenails, thumb nails and right index when compared to baseline photos ? ? ? ?Assessment & Plan  ?AK (actinic keratosis) ?left medial cheek ? ?Start 5-fluorouracil/calcipotriene cream twice a day for 4-7 days to affected areas including nose, cheeks, upper lip. Prescription sent to [facility]. Patient provided with contact information for pharmacy and advised the pharmacy will mail the prescription to their home. Patient provided with handout reviewing treatment course and side effects and advised to call or message [place] on MyChart with any concerns. ? ?5-fluorouracil/calcipotriene cream is is a type of field treatment used to treat precancers, thin skin cancers, and areas of sun damage. Expected reaction includes irritation and mild inflammation potentially progressing to more severe inflammation including redness, scaling, crusting and open sores/erosions.  If too much irritation occurs, ensure application of only a thin layer and  decrease frequency of use to achieve a tolerable level of inflammation. Recommend applying Vaseline ointment to open sores as needed.  Minimize sun exposure while under treatment. Recommend daily broad spectrum sunscreen SPF 30+ to sun-exposed areas, reapply every 2 hours as needed.  ? ?   ? ? ?Destruction of lesion - left medial cheek ? ?Destruction method: cryotherapy   ?Informed consent: discussed and consent obtained   ?Lesion destroyed using liquid nitrogen: Yes   ?Region frozen until ice ball extended beyond lesion: Yes   ?Outcome: patient tolerated procedure well with no complications   ?Post-procedure details: wound care instructions given   ?Additional details:  Prior to procedure, discussed risks of blister formation, small wound, skin dyspigmentation, or rare scar following cryotherapy. Recommend Vaseline ointment to treated areas while healing.  ? ?fluorouracil (EFUDEX) 5 % cream - left medial cheek ?Apply topically 2 (two) times daily. Apply to nose, cheeks, upper lip twice daily for 4-7 days as directed. ? 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 ?Chronic and persistent condition with duration or expected duration over one year. Condition is symptomatic/ bothersome to patient. Improving but not currently at goal.  ? ?Continue fluconazole 200 mg once weekly ? ?Side effects of fluconazole (diflucan) include nausea, diarrhea, headache, dizziness, taste changes, rare risk of irritation of the liver, allergy, or decreased blood counts (which could show up as infection or tiredness). ? ? ?History of PreCancerous Actinic Keratosis  ?- site(s) of PreCancerous Actinic Keratosis clear today at left lower lip ?- these may recur and new lesions may form requiring treatment to prevent transformation into skin cancer ?- observe for new or changing spots and contact [place] for appointment if occur ?-  photoprotection with sun protective clothing; sunglasses and broad spectrum sunscreen with SPF of at  least 30 + and frequent self skin exams recommended ?- yearly exams by a dermatologist recommended for persons with history of PreCancerous Actinic Keratoses ? ?Actinic Damage - Severe, confluent actinic changes with pre-cancerous actinic keratoses  ?- Severe, chronic, not at goal, secondary to cumulative UV radiation exposure over time ?- diffuse scaly erythematous macules and papules with underlying dyspigmentation ?- Discussed Prescription "Field Treatment" for Severe, Chronic Confluent Actinic Changes with Pre-Cancerous Actinic Keratoses ?Field treatment involves treatment of an entire area of skin that has confluent Actinic Changes (Sun/ Ultraviolet light damage) and PreCancerous Actinic Keratoses by method of PhotoDynamic Therapy (PDT) and/or prescription Topical Chemotherapy agents such as 5-fluorouracil, 5-fluorouracil/calcipotriene, and/or imiquimod.  The purpose is to decrease the number of clinically evident and subclinical PreCancerous lesions to prevent progression to development of skin cancer by chemically destroying early precancer changes that may or may not be visible.  It has been shown to reduce the risk of developing skin cancer in the treated area. As a result of treatment, redness, scaling, crusting, and open sores may occur during treatment course. One or more than one of these methods may be used and may have to be used several times to control, suppress and eliminate the PreCancerous changes. Discussed treatment course, expected reaction, and possible side effects. ?- Recommend daily broad spectrum sunscreen SPF 30+ to sun-exposed areas, reapply every 2 hours as needed.  ?- Staying in the shade or wearing long sleeves, sun glasses (UVA+UVB protection) and wide brim hats (4-inch brim around the entire circumference of the hat) are also recommended. ?- Call for new or changing lesions.  ?- Start 5-fluorouracil/calcipotriene cream twice a day for 4-7 days to affected areas including nose,  cheeks, upper lip. Prescription sent to [facility].  ? ? ?Return in about 4 months (around 10/30/2021) for AK follow up, nails. ? ?Graciella Belton, RMA, am acting as scribe for Brendolyn Patty, MD . ? ?Documentation: I have reviewed the above documentation for accuracy and completeness, and I agree with the above. ? ?Brendolyn Patty MD  ? ?

## 2021-08-03 ENCOUNTER — Ambulatory Visit (INDEPENDENT_AMBULATORY_CARE_PROVIDER_SITE_OTHER): Payer: Medicare Other | Admitting: Dermatology

## 2021-08-03 DIAGNOSIS — B351 Tinea unguium: Secondary | ICD-10-CM | POA: Diagnosis not present

## 2021-08-03 DIAGNOSIS — L578 Other skin changes due to chronic exposure to nonionizing radiation: Secondary | ICD-10-CM | POA: Diagnosis not present

## 2021-08-03 DIAGNOSIS — L570 Actinic keratosis: Secondary | ICD-10-CM

## 2021-08-03 DIAGNOSIS — D692 Other nonthrombocytopenic purpura: Secondary | ICD-10-CM | POA: Diagnosis not present

## 2021-08-03 DIAGNOSIS — L820 Inflamed seborrheic keratosis: Secondary | ICD-10-CM | POA: Diagnosis not present

## 2021-08-03 NOTE — Patient Instructions (Addendum)
Restart 5-fluorouracil/calcipotriene cream twice a day for 7 days to affected areas including right cheek and use at back of hands   Actinic keratoses are precancerous spots that appear secondary to cumulative UV radiation exposure/sun exposure over time. They are chronic with expected duration over 1 year. A portion of actinic keratoses will progress to squamous cell carcinoma of the skin. It is not possible to reliably predict which spots will progress to skin cancer and so treatment is recommended to prevent development of skin cancer.  Recommend daily broad spectrum sunscreen SPF 30+ to sun-exposed areas, reapply every 2 hours as needed.  Recommend staying in the shade or wearing long sleeves, sun glasses (UVA+UVB protection) and wide brim hats (4-inch brim around the entire circumference of the hat). Call for new or changing lesions.   Cryotherapy Aftercare  Wash gently with soap and water everyday.   Apply Vaseline and Band-Aid daily until healed.   Seborrheic Keratosis  What causes seborrheic keratoses? Seborrheic keratoses are harmless, common skin growths that first appear during adult life.  As time goes by, more growths appear.  Some people may develop a large number of them.  Seborrheic keratoses appear on both covered and uncovered body parts.  They are not caused by sunlight.  The tendency to develop seborrheic keratoses can be inherited.  They vary in color from skin-colored to gray, brown, or even black.  They can be either smooth or have a rough, warty surface.   Seborrheic keratoses are superficial and look as if they were stuck on the skin.  Under the microscope this type of keratosis looks like layers upon layers of skin.  That is why at times the top layer may seem to fall off, but the rest of the growth remains and re-grows.    Treatment Seborrheic keratoses do not need to be treated, but can easily be removed in the office.  Seborrheic keratoses often cause symptoms when  they rub on clothing or jewelry.  Lesions can be in the way of shaving.  If they become inflamed, they can cause itching, soreness, or burning.  Removal of a seborrheic keratosis can be accomplished by freezing, burning, or surgery. If any spot bleeds, scabs, or grows rapidly, please return to have it checked, as these can be an indication of a skin cancer.     If You Need Anything After Your Visit  If you have any questions or concerns for your doctor, please call our main line at [phone] and press option 4 to reach your doctor's medical assistant. If no one answers, please leave a voicemail as directed and we will return your call as soon as possible. Messages left after 4 pm will be answered the following business day.   You may also send [place] a message via [place]. We typically respond to MyChart messages within 1-2 business days.  For prescription refills, please ask your pharmacy to contact our office. Our fax number is [phone].  If you have an urgent issue when the clinic is closed that cannot wait until the next business day, you can page your doctor at the number below.    Please note that while we do our best to be available for urgent issues outside of office hours, we are not available 24/7.   If you have an urgent issue and are unable to reach [place], you may choose to seek medical care at your doctor's office, retail clinic, urgent care center, or emergency room.  If you have a  medical emergency, please immediately call 911 or go to the emergency department.  Pager Numbers  - Dr. Nehemiah Massed: [phone]  - Dr. Laurence Ferrari: [phone]  - Dr. Nicole Kindred: [phone]  In the event of inclement weather, please call our main line at [phone] for an update on the status of any delays or closures.  Dermatology Medication Tips: Please keep the boxes that topical medications come in in order to help keep track of the instructions about where and how to use these. Pharmacies  typically print the medication instructions only on the boxes and not directly on the medication tubes.   If your medication is too expensive, please contact our office at [phone] option 4 or send [place] a message through [place].   We are unable to tell what your co-pay for medications will be in advance as this is different depending on your insurance coverage. However, we may be able to find a substitute medication at lower cost or fill out paperwork to get insurance to cover a needed medication.   If a prior authorization is required to get your medication covered by your insurance company, please allow [place] 1-2 business days to complete this process.  Drug prices often vary depending on where the prescription is filled and some pharmacies may offer cheaper prices.  The website www.goodrx.com contains coupons for medications through different pharmacies. The prices here do not account for what the cost may be with help from insurance (it may be cheaper with your insurance), but the website can give you the price if you did not use any insurance.  - You can print the associated coupon and take it with your prescription to the pharmacy.  - You may also stop by our office during regular business hours and pick up a GoodRx coupon card.  - If you need your prescription sent electronically to a different pharmacy, notify our office through [facility] or by phone at [phone] option 4.     Si Usted Necesita Algo Despus de Su Visita  Tambin puede enviarnos un mensaje a travs de Pharmacist, community. Por lo general respondemos a los mensajes de MyChart en el transcurso de 1 a 2 das hbiles.  Para renovar recetas, por favor pida a su farmacia que se ponga en contacto con nuestra oficina. Harland Dingwall de fax es [place] [phone].  Si tiene un asunto urgente cuando la clnica est cerrada y que no puede esperar hasta el siguiente da hbil, puede llamar/localizar a su doctor(a) al nmero que  aparece a continuacin.   Por favor, tenga en cuenta que aunque hacemos todo lo posible para estar disponibles para asuntos urgentes fuera del horario de [place], no estamos disponibles las 24 horas del da, los 7 das de la [place].   Si tiene un problema urgente y no puede comunicarse con nosotros, puede optar por buscar atencin mdica  en el consultorio de su doctor(a), en una clnica privada, en un centro de atencin urgente o en una sala de emergencias.  Si tiene Engineering geologist, por favor llame inmediatamente al 911 o vaya a la sala de emergencias.  Nmeros de bper  - Dr. Nehemiah Massed: [phone]  - Dra. Moye: [phone]  - Dra. Nicole Kindred: [phone]  En caso de inclemencias del [place], por favor llame a Johnsie Kindred principal al [phone] para una actualizacin sobre el [place] de cualquier retraso o cierre.  Consejos para la medicacin en dermatologa: Por favor, guarde las cajas en las que vienen los medicamentos de uso tpico para ayudarle  a seguir las [employer] dnde y cmo usarlos. Las farmacias generalmente imprimen las instrucciones del medicamento slo en las cajas y no directamente en los tubos del [place].   Si su medicamento es muy caro, por favor, pngase en contacto con Zigmund Daniel llamando al [phone] y presione la opcin 4 o envenos un mensaje a travs de Pharmacist, community.   No podemos decirle cul ser su copago por los medicamentos por adelantado ya que esto es diferente dependiendo de la cobertura de su seguro. Sin embargo, es posible que podamos encontrar un medicamento sustituto a Electrical engineer un formulario para que el seguro cubra el medicamento que se considera necesario.   Si se requiere una autorizacin previa para que su compaa de seguros [place] su medicamento, por favor permtanos de 1 a 2 das hbiles para completar este proceso.  Los precios de los medicamentos varan con frecuencia dependiendo del Environmental consultant de dnde se surte  la receta y alguna farmacias pueden ofrecer precios ms baratos.  El sitio web www.goodrx.com tiene cupones para medicamentos de Airline pilot. Los precios aqu no tienen en cuenta lo que podra costar con la ayuda del seguro (puede ser ms barato con su seguro), pero el sitio web puede darle el precio si no utiliz Research scientist (physical sciences).  - Puede imprimir el cupn correspondiente y llevarlo con su receta a la farmacia.  - Tambin puede pasar por nuestra oficina durante el horario de atencin regular y Charity fundraiser una tarjeta de cupones de GoodRx.  - Si necesita que su receta se enve electrnicamente a una farmacia diferente, informe a nuestra oficina a travs de MyChart de [place] o por telfono llamando al [phone] y presione la opcin 4.

## 2021-08-03 NOTE — Progress Notes (Signed)
Follow-Up Visit   Subjective  Meghan Myers is a 71 y.o. female who presents for the following: Actinic Keratosis (Hx of aks, used 57fuorouracil cream nose cheeks and upper lip. Some spots at arms since last follow up. ) and Tinea Pedis (Hx at fingersnails and toenails. Reports on fluconazole.  ) since 2/23.  Nails improving, no side effects  Patient reports several spots she would like to have checked at arms and legs that are irritating.   The patient has spots, moles and lesions to be evaluated, some may be new or changing and the patient has concerns that these could be cancer.   The following portions of the chart were reviewed this encounter and updated as appropriate:      Review of Systems: No other skin or systemic complaints except as noted in HPI or Assessment and Plan.   Objective  Well appearing patient in no apparent distress; mood and affect are within normal limits.  A focused examination was performed including bilateral arms, legs, feet, hands, face. Relevant physical exam findings are noted in the Assessment and Plan.  right malar cheek, nose, face Erythema on nose no scale   Pink slightly scaly macule at right malar cheek   fingernails, toenails Onycholysis on right thumbnail and right index fingernail with improvement compared to baseline photos.  Also some improvement on toenails   left forearm at elbow x 1, left upper knee x 1, left medial lower leg x 2, (4) Erythematous stuck-on, waxy papule   Assessment & Plan  Actinic keratosis right malar cheek, nose, face  Good results with 5FU/Vit D cream.  Pt defers cryotherapy today to R malar cheek Will restart 566f cream at right malar cheek bid for another week. Will recheck at next follow up, if not clear will freeze at that time  5-fluorouracil/calcipotriene cream is is a type of field treatment used to treat precancers, thin skin cancers, and areas of sun damage. Expected reaction includes irritation  and mild inflammation potentially progressing to more severe inflammation including redness, scaling, crusting and open sores/erosions.  If too much irritation occurs, ensure application of only a thin layer and decrease frequency of use to achieve a tolerable level of inflammation. Recommend applying Vaseline ointment to open sores as needed.  Minimize sun exposure while under treatment. Recommend daily broad spectrum sunscreen SPF 30+ to sun-exposed areas, reapply every 2 hours as needed.       Actinic keratoses are precancerous spots that appear secondary to cumulative UV radiation exposure/sun exposure over time. They are chronic with expected duration over 1 year. A portion of actinic keratoses will progress to squamous cell carcinoma of the skin. It is not possible to reliably predict which spots will progress to skin cancer and so treatment is recommended to prevent development of skin cancer.  Recommend daily broad spectrum sunscreen SPF 30+ to sun-exposed areas, reapply every 2 hours as needed.  Recommend staying in the shade or wearing long sleeves, sun glasses (UVA+UVB protection) and wide brim hats (4-inch brim around the entire circumference of the hat). Call for new or changing lesions.  Tinea unguium fingernails, toenails  Chronic and persistent condition with duration or expected duration over one year. Condition is symptomatic/ bothersome to patient. Improving but not currently at goal.    Continue fluconazole 200 mg once weekly patient will call when she runs out of medication.  Will need to send in at least 2 more months.   Side effects of fluconazole (diflucan)  include nausea, diarrhea, headache, dizziness, taste changes, rare risk of irritation of the liver, allergy, or decreased blood counts (which could show up as infection or tiredness).  Inflamed seborrheic keratosis (4) left forearm at elbow x 1, left upper knee x 1, left medial lower leg x 2,  Symptomatic, irritating,  patient would like treated.  Destruction of lesion - left forearm at elbow x 1, left upper knee x 1, left medial lower leg x 2,  Destruction method: cryotherapy   Informed consent: discussed and consent obtained   Lesion destroyed using liquid nitrogen: Yes   Region frozen until ice ball extended beyond lesion: Yes   Outcome: patient tolerated procedure well with no complications   Post-procedure details: wound care instructions given   Additional details:  Prior to procedure, discussed risks of blister formation, small wound, skin dyspigmentation, or rare scar following cryotherapy. Recommend Vaseline ointment to treated areas while healing.    Purpura - Chronic; persistent and recurrent.  Treatable, but not curable. - Violaceous macules and patches - Benign - Related to trauma, age, sun damage and/or use of blood thinners, chronic use of topical and/or oral steroids - Observe - Can use OTC arnica containing moisturizer such as Dermend Bruise Formula if desired - Call for worsening or other concerns  Actinic Damage - Severe, confluent actinic changes with pre-cancerous actinic keratoses  - Severe, chronic, not at goal, secondary to cumulative UV radiation exposure over time - diffuse scaly erythematous macules and papules with underlying dyspigmentation - Discussed Prescription "Field Treatment" for Severe, Chronic Confluent Actinic Changes with Pre-Cancerous Actinic Keratoses Field treatment involves treatment of an entire area of skin that has confluent Actinic Changes (Sun/ Ultraviolet light damage) and PreCancerous Actinic Keratoses by method of PhotoDynamic Therapy (PDT) and/or prescription Topical Chemotherapy agents such as 5-fluorouracil, 5-fluorouracil/calcipotriene, and/or imiquimod.  The purpose is to decrease the number of clinically evident and subclinical PreCancerous lesions to prevent progression to development of skin cancer by chemically destroying early precancer changes  that may or may not be visible.  It has been shown to reduce the risk of developing skin cancer in the treated area. As a result of treatment, redness, scaling, crusting, and open sores may occur during treatment course. One or more than one of these methods may be used and may have to be used several times to control, suppress and eliminate the PreCancerous changes. Discussed treatment course, expected reaction, and possible side effects. - Recommend daily broad spectrum sunscreen SPF 30+ to sun-exposed areas, reapply every 2 hours as needed.  - Staying in the shade or wearing long sleeves, sun glasses (UVA+UVB protection) and wide brim hats (4-inch brim around the entire circumference of the hat) are also recommended. - Call for new or changing lesions.  Return in about 2 months (around 10/03/2021) for ak followup/ recheck nails . I, Ruthell Rummage, CMA, am acting as scribe for Brendolyn Patty, MD.  Documentation: I have reviewed the above documentation for accuracy and completeness, and I agree with the above.  Brendolyn Patty MD

## 2021-08-24 ENCOUNTER — Other Ambulatory Visit: Payer: Self-pay | Admitting: Family Medicine

## 2021-08-24 DIAGNOSIS — Z1231 Encounter for screening mammogram for malignant neoplasm of breast: Secondary | ICD-10-CM

## 2021-08-25 ENCOUNTER — Ambulatory Visit
Admission: RE | Admit: 2021-08-25 | Discharge: 2021-08-25 | Disposition: A | Discharge status: Home / Self Care | Payer: Medicare Other | Source: Ambulatory Visit | Attending: Family Medicine | Admitting: Family Medicine

## 2021-08-25 DIAGNOSIS — Z1231 Encounter for screening mammogram for malignant neoplasm of breast: Secondary | ICD-10-CM | POA: Diagnosis present

## 2021-10-06 ENCOUNTER — Ambulatory Visit (INDEPENDENT_AMBULATORY_CARE_PROVIDER_SITE_OTHER): Payer: Medicare Other | Admitting: Dermatology

## 2021-10-06 DIAGNOSIS — D692 Other nonthrombocytopenic purpura: Secondary | ICD-10-CM | POA: Diagnosis not present

## 2021-10-06 DIAGNOSIS — Z872 Personal history of diseases of the skin and subcutaneous tissue: Secondary | ICD-10-CM

## 2021-10-06 DIAGNOSIS — L570 Actinic keratosis: Secondary | ICD-10-CM

## 2021-10-06 DIAGNOSIS — B351 Tinea unguium: Secondary | ICD-10-CM

## 2021-10-06 DIAGNOSIS — L578 Other skin changes due to chronic exposure to nonionizing radiation: Secondary | ICD-10-CM | POA: Diagnosis not present

## 2021-10-06 DIAGNOSIS — L603 Nail dystrophy: Secondary | ICD-10-CM | POA: Diagnosis not present

## 2021-10-06 DIAGNOSIS — L821 Other seborrheic keratosis: Secondary | ICD-10-CM

## 2021-10-06 MED ORDER — JUBLIA 10 % EX SOLN: 1.0000 | Freq: Every day | CUTANEOUS | 4 refills | Status: DC

## 2021-10-06 MED ORDER — FLUCONAZOLE 200 MG PO TABS: 200.0000 mg | ORAL_TABLET | Freq: Every day | ORAL | 0 refills | Status: DC

## 2021-10-06 NOTE — Patient Instructions (Addendum)
Continue fluconazole 200 mg tablet by mouth once weekly. For nails and toenails Hold off until stomach issues resolve   For Bruising    Can use OTC arnica containing moisturizer such as Dermend Bruise Formula if desired    Can restart in fall 5-fluorouracil/calcipotriene cream twice a day for 7 days to affected areas including face .    5-Fluorouracil/Calcipotriene Patient Education   Actinic keratoses are the dry, red scaly spots on the skin caused by sun damage. A portion of these spots can turn into skin cancer with time, and treating them can help prevent development of skin cancer.   Treatment of these spots requires removal of the defective skin cells. There are various ways to remove actinic keratoses, including freezing with liquid nitrogen, treatment with creams, or treatment with a blue light procedure in the office.   5-fluorouracil cream is a topical cream used to treat actinic keratoses. It works by interfering with the growth of abnormal fast-growing skin cells, such as actinic keratoses. These cells peel off and are replaced by healthy ones.   5-fluorouracil/calcipotriene is a combination of the 5-fluorouracil cream with a vitamin D analog cream called calcipotriene. The calcipotriene alone does not treat actinic keratoses. However, when it is combined with 5-fluorouracil, it helps the 5-fluorouracil treat the actinic keratoses much faster so that the same results can be achieved with a much shorter treatment time.  INSTRUCTIONS FOR 5-FLUOROURACIL/CALCIPOTRIENE CREAM:   5-fluorouracil/calcipotriene cream typically only needs to be used for 4-7 days. A thin layer should be applied twice a day to the treatment areas recommended by your physician.   If your physician prescribed you separate tubes of 5-fluourouracil and calcipotriene, apply a thin layer of 5-fluorouracil followed by a thin layer of calcipotriene.   Avoid contact with your eyes, nostrils, and mouth. Do not use  5-fluorouracil/calcipotriene cream on infected or open wounds.   You will develop redness, irritation and some crusting at areas where you have pre-cancer damage/actinic keratoses. IF YOU DEVELOP PAIN, BLEEDING, OR SIGNIFICANT CRUSTING, STOP THE TREATMENT EARLY - you have already gotten a good response and the actinic keratoses should clear up well.  Wash your hands after applying 5-fluorouracil 5% cream on your skin.   A moisturizer or sunscreen with a minimum SPF 30 should be applied each morning.   Once you have finished the treatment, you can apply a thin layer of Vaseline twice a day to irritated areas to soothe and calm the areas more quickly. If you experience significant discomfort, contact your physician.  For some patients it is necessary to repeat the treatment for best results.  SIDE EFFECTS: When using 5-fluorouracil/calcipotriene cream, you may have mild irritation, such as redness, dryness, swelling, or a mild burning sensation. This usually resolves within 2 weeks. The more actinic keratoses you have, the more redness and inflammation you can expect during treatment. Eye irritation has been reported rarely. If this occurs, please let us know.  If you have any trouble using this cream, please call the office. If you have any other questions about this information, please do not hesitate to ask me before you leave the office.  Due to recent changes in healthcare laws, you may see results of your pathology and/or laboratory studies on MyChart before the doctors have had a chance to review them. We understand that in some cases there may be results that are confusing or concerning to you. Please understand that not all results are received at the same time and often  the doctors may need to interpret multiple results in order to provide you with the best plan of care or course of treatment. Therefore, we ask that you please give [place] 2 business days to thoroughly review all your results  before contacting the office for clarification. Should we see a critical lab result, you will be contacted sooner.   If You Need Anything After Your Visit  If you have any questions or concerns for your doctor, please call our main line at [phone] and press option 4 to reach your doctor's medical assistant. If no one answers, please leave a voicemail as directed and we will return your call as soon as possible. Messages left after 4 pm will be answered the following business day.   You may also send [place] a message via [place]. We typically respond to MyChart messages within 1-2 business days.  For prescription refills, please ask your pharmacy to contact our office. Our fax number is [phone].  If you have an urgent issue when the clinic is closed that cannot wait until the next business day, you can page your doctor at the number below.    Please note that while we do our best to be available for urgent issues outside of office hours, we are not available 24/7.   If you have an urgent issue and are unable to reach [place], you may choose to seek medical care at your doctor's office, retail clinic, urgent care center, or emergency room.  If you have a medical emergency, please immediately call 911 or go to the emergency department.  Pager Numbers  - Dr. Nehemiah Massed: [phone]  - Dr. Laurence Ferrari: [phone]  - Dr. Nicole Kindred: [phone]  In the event of inclement weather, please call our main line at [phone] for an update on the status of any delays or closures.  Dermatology Medication Tips: Please keep the boxes that topical medications come in in order to help keep track of the instructions about where and how to use these. Pharmacies typically print the medication instructions only on the boxes and not directly on the medication tubes.   If your medication is too expensive, please contact our office at [phone] option 4 or send [place] a message through [place].   We are unable to  tell what your co-pay for medications will be in advance as this is different depending on your insurance coverage. However, we may be able to find a substitute medication at lower cost or fill out paperwork to get insurance to cover a needed medication.   If a prior authorization is required to get your medication covered by your insurance company, please allow [place] 1-2 business days to complete this process.  Drug prices often vary depending on where the prescription is filled and some pharmacies may offer cheaper prices.  The website www.goodrx.com contains coupons for medications through different pharmacies. The prices here do not account for what the cost may be with help from insurance (it may be cheaper with your insurance), but the website can give you the price if you did not use any insurance.  - You can print the associated coupon and take it with your prescription to the pharmacy.  - You may also stop by our office during regular business hours and pick up a GoodRx coupon card.  - If you need your prescription sent electronically to a different pharmacy, notify our office through [facility] or by phone at [phone] option 4.     Si [place]  Algo Despus de Su Visita  Tambin puede enviarnos un mensaje a travs de MyChart. Por lo general respondemos a los mensajes de MyChart en el transcurso de 1 a 2 das hbiles.  Para renovar recetas, por favor pida a su farmacia que se ponga en contacto con nuestra oficina. Harland Dingwall de fax es [place] [phone].  Si tiene un asunto urgente cuando la clnica est cerrada y que no puede esperar hasta el siguiente da hbil, puede llamar/localizar a su doctor(a) al nmero que aparece a continuacin.   Por favor, tenga en cuenta que aunque hacemos todo lo posible para estar disponibles para asuntos urgentes fuera del horario de [place], no estamos disponibles las 24 horas del da, los 7 das de la [place].   Si tiene un problema  urgente y no puede comunicarse con nosotros, puede optar por buscar atencin mdica  en el consultorio de su doctor(a), en una clnica privada, en un centro de atencin urgente o en una sala de emergencias.  Si tiene Engineering geologist, por favor llame inmediatamente al 911 o vaya a la sala de emergencias.  Nmeros de bper  - Dr. Nehemiah Massed: [phone]  - Dra. Moye: [phone]  - Dra. Nicole Kindred: [phone]  En caso de inclemencias del [place], por favor llame a Johnsie Kindred principal al [phone] para una actualizacin sobre el [place] de cualquier retraso o cierre.  Consejos para la medicacin en dermatologa: Por favor, guarde las cajas en las que vienen los medicamentos de uso tpico para ayudarle a seguir las instrucciones sobre dnde y cmo usarlos. Las farmacias generalmente imprimen las instrucciones del medicamento slo en las cajas y no directamente en los tubos del [place].   Si su medicamento es muy caro, por favor, pngase en contacto con Zigmund Daniel llamando al [phone] y presione la opcin 4 o envenos un mensaje a travs de Pharmacist, community.   No podemos decirle cul ser su copago por los medicamentos por adelantado ya que esto es diferente dependiendo de la cobertura de su seguro. Sin embargo, es posible que podamos encontrar un medicamento sustituto a Electrical engineer un formulario para que el seguro cubra el medicamento que se considera necesario.   Si se requiere una autorizacin previa para que su compaa de seguros [place] su medicamento, por favor permtanos de 1 a 2 das hbiles para completar este proceso.  Los precios de los medicamentos varan con frecuencia dependiendo del Environmental consultant de dnde se surte la receta y alguna farmacias pueden ofrecer precios ms baratos.  El sitio web www.goodrx.com tiene cupones para medicamentos de Airline pilot. Los precios aqu no tienen en cuenta lo que podra costar con la ayuda del seguro (puede ser ms barato con  su seguro), pero el sitio web puede darle el precio si no utiliz Research scientist (physical sciences).  - Puede imprimir el cupn correspondiente y llevarlo con su receta a la farmacia.  - Tambin puede pasar por nuestra oficina durante el horario de atencin regular y Charity fundraiser una tarjeta de cupones de GoodRx.  - Si necesita que su receta se enve electrnicamente a una farmacia diferente, informe a nuestra oficina a travs de MyChart de [place] o por telfono llamando al [phone] y presione la opcin 4.

## 2021-10-06 NOTE — Progress Notes (Signed)
Follow-Up Visit   Subjective  Meghan Myers is a 71 y.o. female who presents for the following: Follow-up (Patient reports a spot at left thigh she would like checked. Denies that it is bothering her just wants to make sure it is ok. ), tinea unguium (Follow up. Has been taking fluconazole 200 mg once weekly. Reports in need of refills if patient needs prescription. Reports noticed some improvement in fingernails and toenails.), and Actinic Keratosis (Hx of aks at face, cheeks, and nose. Was prescribed 58fu cream to use twice daily for a week to affected areas. Reports using cream for 3 days and then stopping. Did get some response. ). No side effects from Fluconazole that she knows of.   The following portions of the chart were reviewed this encounter and updated as appropriate:      Review of Systems: No other skin or systemic complaints except as noted in HPI or Assessment and Plan.   Objective  Well appearing patient in no apparent distress; mood and affect are within normal limits.  A focused examination was performed including b/l fingernails and toenails, face, nose, cheeks, and . Relevant physical exam findings are noted in the Assessment and Plan.  fingernails and toenails Onycholysis on right thumbnail and right index fingernail with improvement compared to baseline photos.  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 Condition is symptomatic/ bothersome to patient. Improving but not currently at goal.   10/06/21 LFTs and CBC/diff are normal Continue fluconazole 200 mg once weekly until gone. 15 pills no rfs, for total 45 weeks treatment Start Jublia 10 % soln - apply topically to aa toenails and fingernails at bedtime. Will submit to insurance advised may not be covered by insurance, if too expensive doesn't have to get.    Side effects of  fluconazole (diflucan) include nausea, diarrhea, headache, dizziness, taste changes, rare risk of irritation of the liver, allergy, or decreased blood counts (which could show up as infection or tiredness).  Efinaconazole (JUBLIA) 10 % SOLN - fingernails and toenails Apply 1 Application topically at bedtime.  Nail dystrophy  Related Medications fluconazole (DIFLUCAN) 200 MG tablet Take 1 tablet (200 mg total) by mouth daily.   Seborrheic Keratoses Left upper medial thigh  - Stuck-on, waxy, tan-brown papules and/or plaques  - Benign-appearing - Discussed benign etiology and prognosis. - Observe - Call for any changes  Purpura - Chronic; persistent and recurrent.  Treatable, but not curable.  At arms  - Violaceous macules and patches - Benign - Related to trauma, age, sun damage and/or use of blood thinners, chronic use of topical and/or oral steroids - Observe - Can use OTC arnica containing moisturizer such as Dermend Bruise Formula if desired - Call for worsening or other concerns   Actinic Damage - Severe, confluent actinic changes with pre-cancerous actinic keratoses  - Severe, chronic, not at goal, secondary to cumulative UV radiation exposure over time - diffuse scaly erythematous macules and papules with underlying dyspigmentation - Discussed Prescription "Field Treatment" for Severe, Chronic Confluent Actinic Changes with Pre-Cancerous Actinic Keratoses Field treatment involves treatment of an entire area of skin that has confluent Actinic Changes (Sun/ Ultraviolet light damage) and PreCancerous Actinic Keratoses by method of PhotoDynamic Therapy (PDT) and/or prescription Topical Chemotherapy agents such as 5-fluorouracil, 5-fluorouracil/calcipotriene, and/or imiquimod.  The purpose is to decrease the number of clinically evident and  subclinical PreCancerous lesions to prevent progression to development of skin cancer by chemically destroying early precancer changes that may  or may not be visible.  It has been shown to reduce the risk of developing skin cancer in the treated area. As a result of treatment, redness, scaling, crusting, and open sores may occur during treatment course. One or more than one of these methods may be used and may have to be used several times to control, suppress and eliminate the PreCancerous changes. Discussed treatment course, expected reaction, and possible side effects. - Recommend daily broad spectrum sunscreen SPF 30+ to sun-exposed areas, reapply every 2 hours as needed.  - Staying in the shade or wearing long sleeves, sun glasses (UVA+UVB protection) and wide brim hats (4-inch brim around the entire circumference of the hat) are also recommended. - Call for new or changing lesions. - Pt used 5FU/VitD twice daily for 3 days, then stopped. Not much reaction Recommend restarting in Fall 5-fluorouracil/calcipotriene cream twice a day for 7 days to affected areas including nose, cheeks, face.   History of PreCancerous Actinic Keratosis  At face - clear at exam today  - site(s) of PreCancerous Actinic Keratosis clear today. - these may recur and new lesions may form requiring treatment to prevent transformation into skin cancer - observe for new or changing spots and contact [place] for appointment if occur - photoprotection with sun protective clothing; sunglasses and broad spectrum sunscreen with SPF of at least 30 + and frequent self skin exams recommended - yearly exams by a dermatologist recommended for persons with history of PreCancerous Actinic Keratoses   Return in about 6 months (around 04/08/2022) for ak follow up, tinea unguium . I, Ruthell Rummage, CMA, am acting as scribe for Brendolyn Patty, MD.  Documentation: I have reviewed the above documentation for accuracy and completeness, and I agree with the above.  Brendolyn Patty MD

## 2022-03-02 ENCOUNTER — Telehealth: Payer: Self-pay

## 2022-03-02 DIAGNOSIS — L570 Actinic keratosis: Secondary | ICD-10-CM

## 2022-03-02 MED ORDER — FLUOROURACIL 5 % EX CREA: TOPICAL_CREAM | Freq: Two times a day (BID) | CUTANEOUS | 0 refills | Status: DC

## 2022-03-02 NOTE — Telephone Encounter (Signed)
Patient came into the office with two prescription bottles for 5FU/Calcipotriene mix. One of the prescriptions was from 09/22 for her husband the other prescription bottle was from [facility] and the bottle label was so worn off I couldn't read the expiration date. Discussed prescriptions with patient and advised her that the expired prescription may not be as effective and I can send in a new prescription to [facility] for her. Patient agrees to have new prescription sent to [facility].

## 2022-04-05 ENCOUNTER — Encounter: Payer: Medicare Other | Admitting: Dermatology

## 2022-04-06 ENCOUNTER — Other Ambulatory Visit: Payer: Self-pay

## 2022-04-06 ENCOUNTER — Encounter: Payer: Self-pay | Admitting: Ophthalmology

## 2022-04-07 NOTE — Discharge Instructions (Signed)

## 2022-04-12 ENCOUNTER — Encounter: Payer: Self-pay | Admitting: Ophthalmology

## 2022-04-12 ENCOUNTER — Encounter
Admission: RE | Disposition: A | Discharge status: Home / Self Care | Payer: Self-pay | Source: Home / Self Care | Attending: Ophthalmology

## 2022-04-12 ENCOUNTER — Ambulatory Visit: Payer: Medicare Other | Admitting: General Practice

## 2022-04-12 ENCOUNTER — Ambulatory Visit
Admission: RE | Admit: 2022-04-12 | Discharge: 2022-04-12 | Disposition: A | Discharge status: Home / Self Care | Payer: Medicare Other | Attending: Ophthalmology | Admitting: Ophthalmology

## 2022-04-12 ENCOUNTER — Other Ambulatory Visit: Payer: Self-pay

## 2022-04-12 DIAGNOSIS — F32A Depression, unspecified: Secondary | ICD-10-CM | POA: Insufficient documentation

## 2022-04-12 DIAGNOSIS — M069 Rheumatoid arthritis, unspecified: Secondary | ICD-10-CM | POA: Diagnosis not present

## 2022-04-12 DIAGNOSIS — H2512 Age-related nuclear cataract, left eye: Secondary | ICD-10-CM | POA: Diagnosis present

## 2022-04-12 DIAGNOSIS — Z791 Long term (current) use of non-steroidal anti-inflammatories (NSAID): Secondary | ICD-10-CM | POA: Diagnosis not present

## 2022-04-12 DIAGNOSIS — I129 Hypertensive chronic kidney disease with stage 1 through stage 4 chronic kidney disease, or unspecified chronic kidney disease: Secondary | ICD-10-CM | POA: Diagnosis not present

## 2022-04-12 DIAGNOSIS — E785 Hyperlipidemia, unspecified: Secondary | ICD-10-CM | POA: Diagnosis not present

## 2022-04-12 DIAGNOSIS — N189 Chronic kidney disease, unspecified: Secondary | ICD-10-CM | POA: Insufficient documentation

## 2022-04-12 DIAGNOSIS — Z79899 Other long term (current) drug therapy: Secondary | ICD-10-CM | POA: Diagnosis not present

## 2022-04-12 DIAGNOSIS — Z87891 Personal history of nicotine dependence: Secondary | ICD-10-CM | POA: Diagnosis not present

## 2022-04-12 DIAGNOSIS — Z85828 Personal history of other malignant neoplasm of skin: Secondary | ICD-10-CM | POA: Diagnosis not present

## 2022-04-12 DIAGNOSIS — I10 Essential (primary) hypertension: Secondary | ICD-10-CM | POA: Insufficient documentation

## 2022-04-12 DIAGNOSIS — Z85528 Personal history of other malignant neoplasm of kidney: Secondary | ICD-10-CM | POA: Insufficient documentation

## 2022-04-12 HISTORY — DX: Unspecified hearing loss, unspecified ear: H91.90

## 2022-04-12 HISTORY — PX: CATARACT EXTRACTION W/PHACO: SHX586

## 2022-04-12 SURGERY — PHACOEMULSIFICATION, CATARACT, WITH IOL INSERTION
Anesthesia: Monitor Anesthesia Care | Site: Eye | Laterality: Left

## 2022-04-12 MED ORDER — LIDOCAINE HCL (PF) 2 % IJ SOLN
INTRAOCULAR | Status: DC | PRN
  Administered 2022-04-12: 1 mL via INTRAOCULAR

## 2022-04-12 MED ORDER — ACETAMINOPHEN 325 MG PO TABS: 650.0000 mg | ORAL_TABLET | Freq: Once | ORAL | Status: DC | PRN

## 2022-04-12 MED ORDER — ONDANSETRON HCL 4 MG/2ML IJ SOLN: 4.0000 mg | Freq: Once | INTRAMUSCULAR | Status: DC | PRN

## 2022-04-12 MED ORDER — MOXIFLOXACIN HCL 0.5 % OP SOLN
OPHTHALMIC | Status: DC | PRN
  Administered 2022-04-12: .2 mL via OPHTHALMIC

## 2022-04-12 MED ORDER — ARMC OPHTHALMIC DILATING DROPS
1.0000 | OPHTHALMIC | Status: DC | PRN
  Administered 2022-04-12 (×3): 1 via OPHTHALMIC

## 2022-04-12 MED ORDER — ACETAMINOPHEN 160 MG/5ML PO SOLN: 325.0000 mg | ORAL | Status: DC | PRN

## 2022-04-12 MED ORDER — LACTATED RINGERS IV SOLN: INTRAVENOUS | Status: DC

## 2022-04-12 MED ORDER — SIGHTPATH DOSE#1 NA HYALUR & NA CHOND-NA HYALUR IO KIT
PACK | INTRAOCULAR | Status: DC | PRN
  Administered 2022-04-12: 1 via OPHTHALMIC

## 2022-04-12 MED ORDER — TETRACAINE HCL 0.5 % OP SOLN
1.0000 [drp] | OPHTHALMIC | Status: DC | PRN
  Administered 2022-04-12 (×3): 1 [drp] via OPHTHALMIC

## 2022-04-12 MED ORDER — MIDAZOLAM HCL 2 MG/2ML IJ SOLN
INTRAMUSCULAR | Status: DC | PRN
  Administered 2022-04-12: 1 mg via INTRAVENOUS

## 2022-04-12 MED ORDER — SIGHTPATH DOSE#1 BSS IO SOLN
INTRAOCULAR | Status: DC | PRN
  Administered 2022-04-12: 15 mL

## 2022-04-12 MED ORDER — SIGHTPATH DOSE#1 BSS IO SOLN
INTRAOCULAR | Status: DC | PRN
  Administered 2022-04-12: 76 mL via OPHTHALMIC

## 2022-04-12 MED ORDER — FENTANYL CITRATE (PF) 100 MCG/2ML IJ SOLN
INTRAMUSCULAR | Status: DC | PRN
  Administered 2022-04-12: 50 ug via INTRAVENOUS

## 2022-04-12 SURGICAL SUPPLY — 19 items
CANNULA ANT/CHMB 27G (MISCELLANEOUS) IMPLANT
CANNULA ANT/CHMB 27GA (MISCELLANEOUS) IMPLANT
CATARACT SUITE SIGHTPATH (MISCELLANEOUS) ×1 IMPLANT
DISSECTOR HYDRO NUCLEUS 50X22 (MISCELLANEOUS) ×1 IMPLANT
FEE CATARACT SUITE SIGHTPATH (MISCELLANEOUS) ×1 IMPLANT
GLOVE SURG GAMMEX PI TX LF 7.5 (GLOVE) ×1 IMPLANT
GLOVE SURG SYN 8.5  E (GLOVE) ×1
GLOVE SURG SYN 8.5 E (GLOVE) ×1 IMPLANT
GLOVE SURG SYN 8.5 PF PI (GLOVE) ×1 IMPLANT
LENS IOL TECNIS EYHANCE 16.0 (Intraocular Lens) IMPLANT
NDL FILTER BLUNT 18X1 1/2 (NEEDLE) ×1 IMPLANT
NEEDLE FILTER BLUNT 18X1 1/2 (NEEDLE) ×1 IMPLANT
PACK VIT ANT 23G (MISCELLANEOUS) IMPLANT
RING MALYGIN (MISCELLANEOUS) IMPLANT
SUT ETHILON 10-0 CS-B-6CS-B-6 (SUTURE)
SUTURE EHLN 10-0 CS-B-6CS-B-6 (SUTURE) IMPLANT
SYR 3ML LL SCALE MARK (SYRINGE) ×1 IMPLANT
SYR 5ML LL (SYRINGE) ×1 IMPLANT
WATER STERILE IRR 250ML POUR (IV SOLUTION) ×1 IMPLANT

## 2022-04-12 NOTE — Transfer of Care (Signed)
Immediate Anesthesia Transfer of Care Note  Patient: Meghan Myers  Procedure(s) Performed: CATARACT EXTRACTION PHACO AND INTRAOCULAR LENS PLACEMENT (IOC) LEFT (Left: Eye)  Patient Location: PACU  Anesthesia Type: MAC  Level of Consciousness: awake, alert  and patient cooperative  Airway and Oxygen Therapy: Patient Spontanous Breathing and Patient connected to supplemental oxygen  Post-op Assessment: Post-op Vital signs reviewed, Patient's Cardiovascular Status Stable, Respiratory Function Stable, Patent Airway and No signs of Nausea or vomiting  Post-op Vital Signs: Reviewed and stable  Complications: No notable events documented.

## 2022-04-12 NOTE — Anesthesia Postprocedure Evaluation (Signed)
Anesthesia Post Note  Patient: Meghan Myers  Procedure(s) Performed: CATARACT EXTRACTION PHACO AND INTRAOCULAR LENS PLACEMENT (IOC) LEFT (Left: Eye)  Patient location during evaluation: PACU Anesthesia Type: MAC Level of consciousness: awake and alert, oriented and patient cooperative Pain management: pain level controlled Vital Signs Assessment: post-procedure vital signs reviewed and stable Respiratory status: spontaneous breathing, nonlabored ventilation and respiratory function stable Cardiovascular status: blood pressure returned to baseline and stable Postop Assessment: adequate PO intake Anesthetic complications: no   No notable events documented.   Last Vitals:  Vitals:   04/12/22 0919 04/12/22 0924  BP: 124/64 112/68  Pulse: 66 66  Resp: 14 14  Temp: (!) 36.3 C (!) 36.3 C  SpO2: 98% 98%    Last Pain:  Vitals:   04/12/22 0924  TempSrc:   PainSc: 0-No pain                 Darrin Nipper

## 2022-04-12 NOTE — Anesthesia Preprocedure Evaluation (Addendum)
Anesthesia Evaluation  Patient identified by MRN, date of birth, ID band Patient awake    Reviewed: Allergy & Precautions, NPO status , Patient's Chart, lab work & pertinent test results  History of Anesthesia Complications Negative for: history of anesthetic complications  Airway Mallampati: I   Neck ROM: Full    Dental  (+) Implants   Pulmonary former smoker (quit 1987)   Pulmonary exam normal breath sounds clear to auscultation       Cardiovascular hypertension, Normal cardiovascular exam Rhythm:Regular Rate:Normal     Neuro/Psych  PSYCHIATRIC DISORDERS  Depression    HOH    GI/Hepatic negative GI ROS,,,  Endo/Other  negative endocrine ROS    Renal/GU Renal disease (kidney cancer; nephrolithiasis)     Musculoskeletal  (+) Arthritis , Osteoarthritis and Rheumatoid disorders,    Abdominal   Peds  Hematology negative hematology ROS (+)   Anesthesia Other Findings   Reproductive/Obstetrics                             Anesthesia Physical Anesthesia Plan  ASA: 2  Anesthesia Plan: MAC   Post-op Pain Management:    Induction: Intravenous  PONV Risk Score and Plan: 2 and Treatment may vary due to age or medical condition, Midazolam and TIVA  Airway Management Planned: Natural Airway and Nasal Cannula  Additional Equipment:   Intra-op Plan:   Post-operative Plan:   Informed Consent: I have reviewed the patients History and Physical, chart, labs and discussed the procedure including the risks, benefits and alternatives for the proposed anesthesia with the patient or authorized representative who has indicated his/her understanding and acceptance.     Dental advisory given  Plan Discussed with: CRNA  Anesthesia Plan Comments: (LMA/GETA backup discussed.  Patient consented for risks of anesthesia including but not limited to:  - adverse reactions to medications - damage to  eyes, teeth, lips or other oral mucosa - nerve damage due to positioning  - sore throat or hoarseness - damage to heart, brain, nerves, lungs, other parts of body or loss of life  Informed patient about role of CRNA in peri- and intra-operative care.  Patient voiced understanding.)       Anesthesia Quick Evaluation

## 2022-04-12 NOTE — H&P (Signed)
[facility]   Primary Care Physician:  Dion Body, MD Ophthalmologist: Dr. Benay Pillow  Pre-Procedure History & Physical: HPI:  Meghan Myers is a 72 y.o. female here for cataract surgery.   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     Past Surgical History:  Procedure Laterality Date   BACK SURGERY     L5 hemilaminectomy   BREAST EXCISIONAL BIOPSY Right 2000?   benign   BUNIONECTOMY Bilateral    FACIAL FRACTURE SURGERY     FACIAL LACERATION REPAIR     FOOT SURGERY     from childhood accident   KIDNEY SURGERY     KNEE ARTHROPLASTY Left 11/26/2020   Procedure: COMPUTER ASSISTED TOTAL KNEE ARTHROPLASTY - RNFA;  Surgeon: Dereck Leep, MD;  Location: [facility];  Service: Orthopedics;  Laterality: Left;   KNEE ARTHROSCOPY Left 05/05/2020   Procedure: ARTHROSCOPY KNEE;  Surgeon: Dereck Leep, MD;  Location: [facility];  Service: Orthopedics;  Laterality: Left;   LUMBAR LAMINECTOMY/DECOMPRESSION MICRODISCECTOMY Right 02/06/2018   Procedure: LUMBAR HEMI-LAMINECTOMY AND FORAMINOTOMYWITH DISCECTOMY 1 LEVEL L4-5;  Surgeon: Deetta Perla, MD;  Location: [facility];  Service: Neurosurgery;  Laterality: Right;   ROTATOR CUFF REPAIR Right    TONSILLECTOMY     TRIGGER FINGER RELEASE Bilateral    URETHRAL DILATION     WRIST SURGERY      Prior to Admission medications   Medication Sig Start Date End Date Taking? Authorizing Provider  amLODipine (NORVASC) 5 MG tablet Take 5 mg by mouth daily. 01/28/20  Yes [provider]  atorvastatin (LIPITOR) 20 MG tablet Take 20 mg by mouth daily.   Yes [provider]  Calcium Carbonate (CALCIUM 600 PO) Take 600 mg by mouth daily.   Yes [provider]  diclofenac (VOLTAREN) 75 MG EC tablet Take by mouth. 06/26/21  Yes [provider]  hydrochlorothiazide (HYDRODIURIL) 25 MG tablet Take 25 mg by mouth daily.   Yes [provider]  Multiple Vitamin (MULTIVITAMIN WITH MINERALS) TABS tablet Take 1 tablet by mouth daily.   Yes [provider]  Omega-3 Fatty Acids (FISH OIL) 1000 MG CAPS Take 1,000 mg by mouth daily.   Yes [provider]  Sertraline HCl 150 MG CAPS Take 150 mg by mouth daily.   Yes [provider]  amphetamine-dextroamphetamine (ADDERALL XR) 25 MG 24 hr capsule Take 25 mg by mouth every morning. Patient not taking: Reported on 10/06/2021    [provider]  Efinaconazole (JUBLIA) 10 % SOLN Apply 1 Application topically at bedtime. Patient not taking: Reported on 04/12/2022 10/06/21   Brendolyn Patty, MD  fluconazole (DIFLUCAN) 200 MG tablet Take 1 tablet (200 mg total) by mouth daily. Patient not taking: Reported on 04/12/2022 10/06/21   Brendolyn Patty, MD  fluorouracil (EFUDEX) 5 % cream Apply topically 2 (two) times daily. Apply to nose, cheeks, upper lip twice daily for 4-7 days as directed. Patient not taking: Reported on 04/12/2022 03/02/22   Brendolyn Patty, MD  Allergies as of 03/23/2022 - Review Complete 10/06/2021  Allergen Reaction Noted   Other Dermatitis 11/19/2020   Meloxicam Other (See Comments) 10/08/2014   Sulfasalazine Nausea Only 10/08/2014    Family History  Problem Relation Age of Onset   Hypertension Mother    COPD Mother    Stroke Mother    Hypertension Father    COPD Father    Heart attack Father    Breast cancer Neg Hx     Social History   Socioeconomic History   Marital status: Married    Spouse name: Not on file   Number of children: Not on file   Years of education: Not on file   Highest education level: Not on file  Occupational  History   Not on file  Tobacco Use   Smoking status: Former    Packs/day: 0.50    Years: 4.00    Total pack years: 2.00    Types: Cigarettes    Quit date: 10/07/1985    Years since quitting: 36.5   Smokeless tobacco: Never  Vaping Use   Vaping Use: Never used  Substance and Sexual Activity   Alcohol use: Yes    Alcohol/week: 7.0 standard drinks of alcohol    Types: 7 Glasses of wine per week   Drug use: No   Sexual activity: Not Currently  Other Topics Concern   Not on file  Social History Narrative   Not on file   Social Determinants of Health   Financial Resource Strain: Not on file  Food Insecurity: Not on file  Transportation Needs: Not on file  Physical Activity: Not on file  Stress: Not on file  Social Connections: Not on file  Intimate Partner Violence: Not on file    Review of Systems: See HPI, otherwise negative ROS  Physical Exam: BP (!) 126/55   Pulse 71   Temp 98.3 F (36.8 C) (Temporal)   Resp 16   Ht 5' 0.05" (1.525 m)   Wt 55.5 kg   SpO2 98%   BMI 23.85 kg/m  General:   Alert, cooperative in NAD Head:  Normocephalic and atraumatic. Respiratory:  Normal work of breathing. Cardiovascular:  RRR  Impression/Plan: Meghan Myers is here for cataract surgery.  Risks, benefits, limitations, and alternatives regarding cataract surgery have been reviewed with the patient.  Questions have been answered.  All parties agreeable.   Benay Pillow, MD  04/12/2022, 8:52 AM

## 2022-04-12 NOTE — Op Note (Signed)
OPERATIVE NOTE  [facility] ZIPF [id number] 04/12/2022   PREOPERATIVE DIAGNOSIS:  Nuclear sclerotic cataract left eye.  H25.12   POSTOPERATIVE DIAGNOSIS:    Nuclear sclerotic cataract left eye.     PROCEDURE:  Phacoemusification with posterior chamber intraocular lens placement of the left eye   LENS:   Implant Name Type Inv. Item Serial No. Manufacturer Lot No. LRB No. Used Action  LENS IOL TECNIS EYHANCE 16.0 - [id number] Intraocular Lens LENS IOL TECNIS EYHANCE 16.0 [id number] [employer]  Left 1 Implanted      Procedure(s) with comments: CATARACT EXTRACTION PHACO AND INTRAOCULAR LENS PLACEMENT (IOC) LEFT (Left) - 4.75 0:33.4  DIB00 +16.0   ULTRASOUND TIME: 0 minutes 33 seconds.  CDE 4.75   SURGEON:  Benay Pillow, MD, MPH   ANESTHESIA:  Topical with tetracaine drops augmented with 1% preservative-free intracameral lidocaine.  ESTIMATED BLOOD LOSS: <1 mL   COMPLICATIONS:  None.   DESCRIPTION OF PROCEDURE:  The patient was identified in the holding room and transported to the operating room and placed in the supine position under the operating microscope.  The left eye was identified as the operative eye and it was prepped and draped in the usual sterile ophthalmic fashion.   A 1.0 millimeter clear-corneal paracentesis was made at the 5:00 position. 0.5 ml of preservative-free 1% lidocaine with epinephrine was injected into the anterior chamber.  The anterior chamber was filled with viscoelastic.  A 2.4 millimeter keratome was used to make a near-clear corneal incision at the 2:00 position.  A curvilinear capsulorrhexis was made with a cystotome and capsulorrhexis forceps.  Balanced salt solution was used to hydrodissect and hydrodelineate the nucleus.   Phacoemulsification was then used in stop and chop fashion to remove the lens nucleus and epinucleus.  The remaining cortex was then removed using the irrigation and aspiration handpiece. Viscoelastic was then placed into the capsular  bag to distend it for lens placement.  A lens was then injected into the capsular bag.  The remaining viscoelastic was aspirated.   Wounds were hydrated with balanced salt solution.  The anterior chamber was inflated to a physiologic pressure with balanced salt solution.  Intracameral vigamox 0.1 mL undiltued was injected into the eye and a drop placed onto the ocular surface.  No wound leaks were noted.  The patient was taken to the recovery room in stable condition without complications of anesthesia or surgery  Benay Pillow 04/12/2022, 9:18 AM

## 2022-04-13 ENCOUNTER — Encounter: Payer: Self-pay | Admitting: Ophthalmology

## 2022-04-22 ENCOUNTER — Encounter: Payer: Self-pay | Admitting: Ophthalmology

## 2022-04-22 NOTE — Discharge Instructions (Signed)

## 2022-04-26 ENCOUNTER — Encounter: Payer: Self-pay | Admitting: Ophthalmology

## 2022-04-26 ENCOUNTER — Ambulatory Visit: Payer: Medicare Other | Admitting: General Practice

## 2022-04-26 ENCOUNTER — Ambulatory Visit: Payer: Medicare Other | Admitting: Dermatology

## 2022-04-26 ENCOUNTER — Other Ambulatory Visit: Payer: Self-pay

## 2022-04-26 ENCOUNTER — Encounter
Disposition: A | Discharge status: Home / Self Care | Payer: Self-pay | Source: Home / Self Care | Attending: Ophthalmology

## 2022-04-26 ENCOUNTER — Ambulatory Visit
Admission: RE | Admit: 2022-04-26 | Discharge: 2022-04-26 | Disposition: A | Discharge status: Home / Self Care | Payer: Medicare Other | Attending: Ophthalmology | Admitting: Ophthalmology

## 2022-04-26 DIAGNOSIS — H2511 Age-related nuclear cataract, right eye: Secondary | ICD-10-CM | POA: Insufficient documentation

## 2022-04-26 DIAGNOSIS — Z85828 Personal history of other malignant neoplasm of skin: Secondary | ICD-10-CM | POA: Insufficient documentation

## 2022-04-26 DIAGNOSIS — I129 Hypertensive chronic kidney disease with stage 1 through stage 4 chronic kidney disease, or unspecified chronic kidney disease: Secondary | ICD-10-CM | POA: Insufficient documentation

## 2022-04-26 DIAGNOSIS — F32A Depression, unspecified: Secondary | ICD-10-CM | POA: Insufficient documentation

## 2022-04-26 DIAGNOSIS — E785 Hyperlipidemia, unspecified: Secondary | ICD-10-CM | POA: Diagnosis not present

## 2022-04-26 DIAGNOSIS — Z87442 Personal history of urinary calculi: Secondary | ICD-10-CM | POA: Diagnosis not present

## 2022-04-26 DIAGNOSIS — N189 Chronic kidney disease, unspecified: Secondary | ICD-10-CM | POA: Insufficient documentation

## 2022-04-26 DIAGNOSIS — Z85528 Personal history of other malignant neoplasm of kidney: Secondary | ICD-10-CM | POA: Insufficient documentation

## 2022-04-26 DIAGNOSIS — Z79899 Other long term (current) drug therapy: Secondary | ICD-10-CM | POA: Insufficient documentation

## 2022-04-26 DIAGNOSIS — M069 Rheumatoid arthritis, unspecified: Secondary | ICD-10-CM | POA: Insufficient documentation

## 2022-04-26 HISTORY — PX: CATARACT EXTRACTION W/PHACO: SHX586

## 2022-04-26 SURGERY — PHACOEMULSIFICATION, CATARACT, WITH IOL INSERTION
Anesthesia: Monitor Anesthesia Care | Site: Eye | Laterality: Right

## 2022-04-26 MED ORDER — FENTANYL CITRATE (PF) 100 MCG/2ML IJ SOLN
INTRAMUSCULAR | Status: DC | PRN
  Administered 2022-04-26: 50 ug via INTRAVENOUS

## 2022-04-26 MED ORDER — SIGHTPATH DOSE#1 BSS IO SOLN
INTRAOCULAR | Status: DC | PRN
  Administered 2022-04-26: 82 mL via OPHTHALMIC

## 2022-04-26 MED ORDER — SIGHTPATH DOSE#1 BSS IO SOLN
INTRAOCULAR | Status: DC | PRN
  Administered 2022-04-26: 15 mL

## 2022-04-26 MED ORDER — LIDOCAINE HCL (PF) 2 % IJ SOLN
INTRAOCULAR | Status: DC | PRN
  Administered 2022-04-26: 1 mL via INTRAOCULAR

## 2022-04-26 MED ORDER — MOXIFLOXACIN HCL 0.5 % OP SOLN
OPHTHALMIC | Status: DC | PRN
  Administered 2022-04-26: .2 mL via OPHTHALMIC

## 2022-04-26 MED ORDER — LACTATED RINGERS IV SOLN: INTRAVENOUS | Status: DC

## 2022-04-26 MED ORDER — SIGHTPATH DOSE#1 NA HYALUR & NA CHOND-NA HYALUR IO KIT
PACK | INTRAOCULAR | Status: DC | PRN
  Administered 2022-04-26: 1 via OPHTHALMIC

## 2022-04-26 MED ORDER — MIDAZOLAM HCL 2 MG/2ML IJ SOLN
INTRAMUSCULAR | Status: DC | PRN
  Administered 2022-04-26: 1 mg via INTRAVENOUS

## 2022-04-26 MED ORDER — ARMC OPHTHALMIC DILATING DROPS
1.0000 | OPHTHALMIC | Status: DC | PRN
  Administered 2022-04-26 (×3): 1 via OPHTHALMIC

## 2022-04-26 MED ORDER — TETRACAINE HCL 0.5 % OP SOLN
1.0000 [drp] | OPHTHALMIC | Status: DC | PRN
  Administered 2022-04-26 (×3): 1 [drp] via OPHTHALMIC

## 2022-04-26 SURGICAL SUPPLY — 13 items
CATARACT SUITE SIGHTPATH (MISCELLANEOUS) ×1 IMPLANT
DISSECTOR HYDRO NUCLEUS 50X22 (MISCELLANEOUS) ×1 IMPLANT
FEE CATARACT SUITE SIGHTPATH (MISCELLANEOUS) ×1 IMPLANT
GLOVE SURG GAMMEX PI TX LF 7.5 (GLOVE) ×1 IMPLANT
GLOVE SURG SYN 8.5  E (GLOVE) ×1
GLOVE SURG SYN 8.5 E (GLOVE) ×1 IMPLANT
GLOVE SURG SYN 8.5 PF PI (GLOVE) ×1 IMPLANT
LENS IOL TECNIS EYHANCE 16.5 (Intraocular Lens) IMPLANT
NDL FILTER BLUNT 18X1 1/2 (NEEDLE) ×1 IMPLANT
NEEDLE FILTER BLUNT 18X1 1/2 (NEEDLE) ×1 IMPLANT
SYR 3ML LL SCALE MARK (SYRINGE) ×1 IMPLANT
SYR 5ML LL (SYRINGE) ×1 IMPLANT
WATER STERILE IRR 250ML POUR (IV SOLUTION) ×1 IMPLANT

## 2022-04-26 NOTE — Anesthesia Postprocedure Evaluation (Signed)
Anesthesia Post Note  Patient: Meghan Myers  Procedure(s) Performed: CATARACT EXTRACTION PHACO AND INTRAOCULAR LENS PLACEMENT (IOC) RIGHT  3.19  00:25.6 (Right: Eye)  Patient location during evaluation: PACU Anesthesia Type: MAC Level of consciousness: awake and alert Pain management: pain level controlled Vital Signs Assessment: post-procedure vital signs reviewed and stable Respiratory status: spontaneous breathing, nonlabored ventilation, respiratory function stable and patient connected to nasal cannula oxygen Cardiovascular status: stable and blood pressure returned to baseline Postop Assessment: no apparent nausea or vomiting Anesthetic complications: no   No notable events documented.   Last Vitals:  Vitals:   04/26/22 0705  BP: (!) 133/57  Pulse: 69  Resp: 18  Temp: 37.2 C  SpO2: 99%    Last Pain:  Vitals:   04/26/22 0705  TempSrc: Temporal  PainSc: 0-No pain                 Molli Barrows

## 2022-04-26 NOTE — Anesthesia Preprocedure Evaluation (Signed)
Anesthesia Evaluation  Patient identified by MRN, date of birth, ID band Patient awake    Reviewed: Allergy & Precautions, H&P , NPO status , Patient's Chart, lab work & pertinent test results, reviewed documented beta blocker date and time   Airway Mallampati: II  TM Distance: >3 FB Neck ROM: full    Dental no notable dental hx. (+) Teeth Intact   Pulmonary neg pulmonary ROS, former smoker   Pulmonary exam normal breath sounds clear to auscultation       Cardiovascular Exercise Tolerance: Good hypertension, On Medications negative cardio ROS  Rhythm:regular Rate:Normal     Neuro/Psych  PSYCHIATRIC DISORDERS  Depression     Neuromuscular disease    GI/Hepatic negative GI ROS, Neg liver ROS,,,  Endo/Other  negative endocrine ROSdiabetes    Renal/GU Renal disease     Musculoskeletal   Abdominal   Peds  Hematology negative hematology ROS (+)   Anesthesia Other Findings   Reproductive/Obstetrics negative OB ROS                             Anesthesia Physical Anesthesia Plan  ASA: 3  Anesthesia Plan: MAC   Post-op Pain Management:    Induction:   PONV Risk Score and Plan: 2  Airway Management Planned:   Additional Equipment:   Intra-op Plan:   Post-operative Plan:   Informed Consent: I have reviewed the patients History and Physical, chart, labs and discussed the procedure including the risks, benefits and alternatives for the proposed anesthesia with the patient or authorized representative who has indicated his/her understanding and acceptance.       Plan Discussed with: CRNA  Anesthesia Plan Comments:        Anesthesia Quick Evaluation

## 2022-04-26 NOTE — H&P (Signed)
[facility]   Primary Care Physician:  Dion Body, MD Ophthalmologist: Dr. Benay Pillow  Pre-Procedure History & Physical: HPI:  Meghan Myers is a 72 y.o. female here for cataract surgery.   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     Past Surgical History:  Procedure Laterality Date   BACK SURGERY     L5 hemilaminectomy   BREAST EXCISIONAL BIOPSY Right 2000?   benign   BUNIONECTOMY Bilateral    CATARACT EXTRACTION W/PHACO Left 04/12/2022   Procedure: CATARACT EXTRACTION PHACO AND INTRAOCULAR LENS PLACEMENT ([place]) LEFT;  Surgeon: Eulogio Bear, MD;  Location: [place];  Service: Ophthalmology;  Laterality: Left;  4.75 0:33.4   FACIAL FRACTURE SURGERY     FACIAL LACERATION REPAIR     FOOT SURGERY     from childhood accident   KIDNEY SURGERY     KNEE ARTHROPLASTY Left 11/26/2020   Procedure: COMPUTER ASSISTED TOTAL KNEE ARTHROPLASTY - RNFA;  Surgeon: Dereck Leep, MD;  Location: [facility];  Service: Orthopedics;  Laterality: Left;   KNEE ARTHROSCOPY Left 05/05/2020   Procedure: ARTHROSCOPY KNEE;  Surgeon: Dereck Leep, MD;  Location: [facility];  Service: Orthopedics;  Laterality: Left;   LUMBAR LAMINECTOMY/DECOMPRESSION MICRODISCECTOMY Right 02/06/2018   Procedure: LUMBAR HEMI-LAMINECTOMY AND FORAMINOTOMYWITH DISCECTOMY 1 LEVEL L4-5;  Surgeon: Deetta Perla, MD;  Location: [facility];  Service: Neurosurgery;  Laterality: Right;   ROTATOR CUFF REPAIR Right    TONSILLECTOMY     TRIGGER FINGER RELEASE Bilateral    URETHRAL DILATION     WRIST SURGERY      Prior to Admission medications    Medication Sig Start Date End Date Taking? Authorizing Provider  amLODipine (NORVASC) 5 MG tablet Take 5 mg by mouth daily. 01/28/20  Yes [provider]  amphetamine-dextroamphetamine (ADDERALL XR) 25 MG 24 hr capsule Take 25 mg by mouth every morning.   Yes [provider]  Calcium Carbonate (CALCIUM 600 PO) Take 600 mg by mouth daily.   Yes [provider]  celecoxib (CELEBREX) 100 MG capsule Take 100 mg by mouth 2 (two) times daily.   Yes [provider]  diclofenac (VOLTAREN) 75 MG EC tablet Take by mouth. 06/26/21  Yes [provider]  hydrochlorothiazide (HYDRODIURIL) 25 MG tablet Take 25 mg by mouth daily.   Yes [provider]  Multiple Vitamin (MULTIVITAMIN WITH MINERALS) TABS tablet Take 1 tablet by mouth daily.   Yes [provider]  Omega-3 Fatty Acids (FISH OIL) 1000 MG CAPS Take 1,000 mg by mouth daily.   Yes [provider]  pantoprazole (PROTONIX) 20 MG tablet Take 20 mg by mouth daily.   Yes [provider]  rosuvastatin (CRESTOR) 5 MG tablet Take 5 mg by mouth daily.   Yes [provider]  Sertraline HCl 150 MG CAPS Take 150 mg by mouth daily.   Yes [provider]  Efinaconazole (JUBLIA) 10 % SOLN Apply 1 Application topically at bedtime. Patient not taking: Reported  on 04/12/2022 10/06/21   Brendolyn Patty, MD  fluconazole (DIFLUCAN) 200 MG tablet Take 1 tablet (200 mg total) by mouth daily. Patient not taking: Reported on 04/12/2022 10/06/21   Brendolyn Patty, MD  fluorouracil (EFUDEX) 5 % cream Apply topically 2 (two) times daily. Apply to nose, cheeks, upper lip twice daily for 4-7 days as directed. Patient not taking: Reported on 04/12/2022 03/02/22   Brendolyn Patty, MD    Allergies as of 03/23/2022 - Review Complete 10/06/2021  Allergen Reaction Noted   Other Dermatitis 11/19/2020   Meloxicam Other (See Comments) 10/08/2014   Sulfasalazine Nausea Only 10/08/2014    Family History   Problem Relation Age of Onset   Hypertension Mother    COPD Mother    Stroke Mother    Hypertension Father    COPD Father    Heart attack Father    Breast cancer Neg Hx     Social History   Socioeconomic History   Marital status: Married    Spouse name: Not on file   Number of children: Not on file   Years of education: Not on file   Highest education level: Not on file  Occupational History   Not on file  Tobacco Use   Smoking status: Former    Packs/day: 0.50    Years: 4.00    Total pack years: 2.00    Types: Cigarettes    Quit date: 10/07/1985    Years since quitting: 36.5   Smokeless tobacco: Never  Vaping Use   Vaping Use: Never used  Substance and Sexual Activity   Alcohol use: Yes    Alcohol/week: 7.0 standard drinks of alcohol    Types: 7 Glasses of wine per week   Drug use: No   Sexual activity: Not Currently  Other Topics Concern   Not on file  Social History Narrative   Not on file   Social Determinants of Health   Financial Resource Strain: Not on file  Food Insecurity: Not on file  Transportation Needs: Not on file  Physical Activity: Not on file  Stress: Not on file  Social Connections: Not on file  Intimate Partner Violence: Not on file    Review of Systems: See HPI, otherwise negative ROS  Physical Exam: BP (!) 133/57   Pulse 69   Temp 98.9 F (37.2 C) (Temporal)   Resp 18   Ht 5' (1.524 m)   Wt 55.4 kg   SpO2 99%   BMI 23.87 kg/m  General:   Alert, cooperative in NAD Head:  Normocephalic and atraumatic. Respiratory:  Normal work of breathing. Cardiovascular:  RRR  Impression/Plan: Meghan Myers is here for cataract surgery.  Risks, benefits, limitations, and alternatives regarding cataract surgery have been reviewed with the patient.  Questions have been answered.  All parties agreeable.   Benay Pillow, MD  04/26/2022, 7:56 AM

## 2022-04-26 NOTE — Transfer of Care (Signed)
Immediate Anesthesia Transfer of Care Note  Patient: Meghan Myers  Procedure(s) Performed: CATARACT EXTRACTION PHACO AND INTRAOCULAR LENS PLACEMENT (IOC) RIGHT  3.19  00:25.6 (Right: Eye)  Patient Location: PACU  Anesthesia Type: MAC  Level of Consciousness: awake, alert  and patient cooperative  Airway and Oxygen Therapy: Patient Spontanous Breathing and Patient connected to supplemental oxygen  Post-op Assessment: Post-op Vital signs reviewed, Patient's Cardiovascular Status Stable, Respiratory Function Stable, Patent Airway and No signs of Nausea or vomiting  Post-op Vital Signs: Reviewed and stable  Complications: No notable events documented.

## 2022-04-26 NOTE — Op Note (Signed)
OPERATIVE NOTE  Meghan Myers [id number] 04/26/2022   PREOPERATIVE DIAGNOSIS:  Nuclear sclerotic cataract right eye.  H25.11   POSTOPERATIVE DIAGNOSIS:    Nuclear sclerotic cataract right eye.     PROCEDURE:  Phacoemusification with posterior chamber intraocular lens placement of the right eye   LENS:   Implant Name Type Inv. Item Serial No. Manufacturer Lot No. LRB No. Used Action  LENS IOL TECNIS EYHANCE 16.5 - [id number] Intraocular Lens LENS IOL TECNIS EYHANCE 16.5 [id number] [employer]  Right 1 Implanted       Procedure(s): CATARACT EXTRACTION PHACO AND INTRAOCULAR LENS PLACEMENT (IOC) RIGHT  3.19  00:25.6 (Right)  DIB00 +16.5   ULTRASOUND TIME: 0 minutes 25 seconds.  CDE 3.19   SURGEON:  Benay Pillow, MD, MPH  ANESTHESIOLOGIST: Anesthesiologist: Molli Barrows, MD CRNA: Moises Blood, CRNA   ANESTHESIA:  Topical with tetracaine drops augmented with 1% preservative-free intracameral lidocaine.  ESTIMATED BLOOD LOSS: less than 1 mL.   COMPLICATIONS:  None.   DESCRIPTION OF PROCEDURE:  The patient was identified in the holding room and transported to the operating room and placed in the supine position under the operating microscope.  The right eye was identified as the operative eye and it was prepped and draped in the usual sterile ophthalmic fashion.   A 1.0 millimeter clear-corneal paracentesis was made at the 10:30 position. 0.5 ml of preservative-free 1% lidocaine with epinephrine was injected into the anterior chamber.  The anterior chamber was filled with viscoelastic.  A 2.4 millimeter keratome was used to make a near-clear corneal incision at the 8:00 position.  A curvilinear capsulorrhexis was made with a cystotome and capsulorrhexis forceps.  Balanced salt solution was used to hydrodissect and hydrodelineate the nucleus.   Phacoemulsification was then used in stop and chop fashion to remove the lens nucleus and epinucleus.  The remaining cortex was then removed  using the irrigation and aspiration handpiece. Viscoelastic was then placed into the capsular bag to distend it for lens placement.  A lens was then injected into the capsular bag.  The remaining viscoelastic was aspirated.   Wounds were hydrated with balanced salt solution.  The anterior chamber was inflated to a physiologic pressure with balanced salt solution.   Intracameral vigamox 0.1 mL undiluted was injected into the eye and a drop placed onto the ocular surface.  No wound leaks were noted.  The patient was taken to the recovery room in stable condition without complications of anesthesia or surgery  Benay Pillow 04/26/2022, 8:20 AM

## 2022-04-27 ENCOUNTER — Encounter: Payer: Self-pay | Admitting: Ophthalmology

## 2022-04-28 ENCOUNTER — Ambulatory Visit (INDEPENDENT_AMBULATORY_CARE_PROVIDER_SITE_OTHER): Payer: Medicare Other | Admitting: Dermatology

## 2022-04-28 VITALS — BP 140/76 | HR 87

## 2022-04-28 DIAGNOSIS — L821 Other seborrheic keratosis: Secondary | ICD-10-CM

## 2022-04-28 DIAGNOSIS — D229 Melanocytic nevi, unspecified: Secondary | ICD-10-CM

## 2022-04-28 DIAGNOSIS — B351 Tinea unguium: Secondary | ICD-10-CM

## 2022-04-28 DIAGNOSIS — Z1283 Encounter for screening for malignant neoplasm of skin: Secondary | ICD-10-CM

## 2022-04-28 DIAGNOSIS — L570 Actinic keratosis: Secondary | ICD-10-CM | POA: Diagnosis not present

## 2022-04-28 DIAGNOSIS — L719 Rosacea, unspecified: Secondary | ICD-10-CM | POA: Diagnosis not present

## 2022-04-28 DIAGNOSIS — L578 Other skin changes due to chronic exposure to nonionizing radiation: Secondary | ICD-10-CM

## 2022-04-28 DIAGNOSIS — L814 Other melanin hyperpigmentation: Secondary | ICD-10-CM

## 2022-04-28 DIAGNOSIS — L920 Granuloma annulare: Secondary | ICD-10-CM | POA: Diagnosis not present

## 2022-04-28 DIAGNOSIS — D1801 Hemangioma of skin and subcutaneous tissue: Secondary | ICD-10-CM

## 2022-04-28 DIAGNOSIS — Z86007 Personal history of in-situ neoplasm of skin: Secondary | ICD-10-CM

## 2022-04-28 MED ORDER — METRONIDAZOLE 0.75 % EX CREA: TOPICAL_CREAM | CUTANEOUS | 2 refills | Status: DC

## 2022-04-28 MED ORDER — TERBINAFINE HCL 250 MG PO TABS: 250.0000 mg | ORAL_TABLET | Freq: Every day | ORAL | 0 refills | Status: DC

## 2022-04-28 MED ORDER — CLOBETASOL PROP EMOLLIENT BASE 0.05 % EX CREA: TOPICAL_CREAM | CUTANEOUS | 1 refills | Status: DC

## 2022-04-28 NOTE — Progress Notes (Signed)
Follow-Up Visit   Subjective  Meghan Myers is a 72 y.o. female who presents for the following: Annual Exam.  The patient presents for Total-Body Skin Exam (TBSE) for skin cancer screening and mole check.  The patient has spots, moles and lesions to be evaluated, some may be new or changing. She has a new spot on her left 3rd finger to check. 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 Hx of AKs. She has used 5FU/Calcipotriene cream on her face and PDT on her legs in the past for precancers, with good reaction.    The following portions of the chart were reviewed this encounter and updated as appropriate:       Review of Systems:  No other skin or systemic complaints except as noted in HPI or Assessment and Plan.  Objective  Well appearing patient in no apparent distress; mood and affect are within normal limits.  A full examination was performed including scalp, head, eyes, ears, nose, lips, neck, chest, axillae, abdomen, back, buttocks, bilateral upper extremities, bilateral lower extremities, hands, feet, fingers, toes, fingernails, and toenails. All findings within normal limits unless otherwise noted below.  L 3rd Finger x 1, L hand dorsum x 3, base of R 3rd finger x 1, L mid forearm x 1, L post ankle x 2, L lower leg x 2, L foot dorsum x 3, R foot dorsum x 2, R lower leg x 4 (19) Keratotic papule  Right Dorsal Mid 5th Finger, R 3rd finger Smooth pink dermal papules R 5th finger, R 3rd finger.  Mid Tip of Nose Inflammatory papule of the nose; erythema and telangiectasias  left great toenail Yellow white onycholysis of the R>L great toenail.     L post upper thigh 1.2 cm waxy tan patch with slight irregular pigment   L lower abdomen inguinal 5 x 4 mm med brown macule  L foot dorsum 6.0 mm flesh papule    Assessment & Plan  Actinic Damage with PreCancerous Actinic Keratoses Counseling for Topical Chemotherapy Management: Patient exhibits: - Severe,  confluent actinic changes with pre-cancerous actinic keratoses that is secondary to cumulative UV radiation exposure over time - Condition that is severe; chronic, not at goal. - diffuse scaly erythematous macules and papules with underlying dyspigmentation - Discussed Prescription "Field Treatment" topical Chemotherapy for Severe, Chronic Confluent Actinic Changes with Pre-Cancerous Actinic Keratoses Field treatment involves treatment of an entire area of skin that has confluent Actinic Changes (Sun/ Ultraviolet light damage) and PreCancerous Actinic Keratoses by method of PhotoDynamic Therapy (PDT) and/or prescription Topical Chemotherapy agents such as 5-fluorouracil, 5-fluorouracil/calcipotriene, and/or imiquimod.  The purpose is to decrease the number of clinically evident and subclinical PreCancerous lesions to prevent progression to development of skin cancer by chemically destroying early precancer changes that may or may not be visible.  It has been shown to reduce the risk of developing skin cancer in the treated area. As a result of treatment, redness, scaling, crusting, and open sores may occur during treatment course. One or more than one of these methods may be used and may have to be used several times to control, suppress and eliminate the PreCancerous changes. Discussed treatment course, expected reaction, and possible side effects. - Recommend daily broad spectrum sunscreen SPF 30+ to sun-exposed areas, reapply every 2 hours as needed.  - Staying in the shade or wearing long sleeves, sun glasses (UVA+UVB protection) and wide brim hats (4-inch brim around the entire circumference of the hat)  are also recommended. - Call for new or changing lesions. -Will schedule blue light with debridement to the legs x 2, 1 mo apart. Plan 5FU/Calcipotriene cream treatment afterwards.   Skin cancer screening performed today.  Lentigines - Scattered tan macules - Due to sun exposure -  Benign-appearing, observe - Recommend daily broad spectrum sunscreen SPF 30+ to sun-exposed areas, reapply every 2 hours as needed. - Call for any changes  Seborrheic Keratoses - Stuck-on, waxy, tan-brown papules and/or plaques  - Benign-appearing - Discussed benign etiology and prognosis. - Observe - Call for any changes  Melanocytic Nevi - Tan-brown and/or pink-flesh-colored symmetric macules and papules - Benign appearing on exam today - Observation - Call clinic for new or changing moles - Recommend daily use of broad spectrum spf 30+ sunscreen to sun-exposed areas.   Hemangiomas - Red papules - Discussed benign nature - Observe - Call for any changes  History of Squamous Cell Carcinoma in Situ of the Skin - Possible recurrence today of the right 3rd finger at base vrs AK, Cryotherapy today, recheck on f/up, bx if not improved - Recommend regular full body skin exams - Recommend daily broad spectrum sunscreen SPF 30+ to sun-exposed areas, reapply every 2 hours as needed.  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 Recheck on f/u.   Actinic keratoses are precancerous spots that appear secondary to cumulative UV radiation exposure/sun exposure over time. They are chronic with expected duration over 1 year. A portion of actinic keratoses will progress to squamous cell carcinoma of the skin. It is not possible to reliably predict which spots will progress to skin cancer and so treatment is recommended to prevent development of skin cancer.  Recommend daily broad spectrum sunscreen SPF 30+ to sun-exposed areas, reapply every 2 hours as needed.  Recommend staying in the shade or wearing long sleeves, sun  glasses (UVA+UVB protection) and wide brim hats (4-inch brim around the entire circumference of the hat). Call for new or changing lesions.  Destruction of lesion - L 3rd Finger x 1, L hand dorsum x 3, base of R 3rd finger x 1, L mid forearm x 1, L post ankle x 2, L lower leg x 2, L foot dorsum x 3, R foot dorsum x 2, R lower leg x 4  Destruction method: cryotherapy   Informed consent: discussed and consent obtained   Lesion destroyed using liquid nitrogen: Yes   Region frozen until ice ball extended beyond lesion: Yes   Outcome: patient tolerated procedure well with no complications   Post-procedure details: wound care instructions given   Additional details:  Prior to procedure, discussed risks of blister formation, small wound, skin dyspigmentation, or rare scar following cryotherapy. Recommend Vaseline ointment to treated areas while healing.   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 Condition is bothersome/symptomatic for patient. Currently flared.   Granuloma annulare is a chronic benign skin condition characterized by pink smooth bumps or ring-like plaques most commonly appearing over the joints and the backs of the  hands/feet. Its cause is not known, and most episodes of granuloma annulare clear up after a few years, with or without treatment.  Start clobetasol cream Apply to pink spots on hands/fingers BID until improved dsp 15g 1Rf. Topical steroids (such as triamcinolone, fluocinolone, fluocinonide, mometasone, clobetasol, halobetasol, betamethasone, hydrocortisone) can cause thinning and lightening of the skin if they are used for too long in the same area. Your physician has selected the right strength medicine for your problem and area affected on the body. Please use your medication only as directed by your physician to prevent side effects.    Clobetasol Prop Emollient Base  (CLOBETASOL PROPIONATE E) 0.05 % emollient cream - Right Dorsal Mid 5th Finger, R 3rd finger Apply to pink spots on hands/fingers twice daily until improved. Avoid face, groin, axilla.  Rosacea Mid Tip of Nose  Chronic and persistent condition with duration or expected duration over one year. Condition is bothersome/symptomatic for patient. Currently flared.   Rosacea is a chronic progressive skin condition usually affecting the face of adults, causing redness and/or acne bumps. It is treatable but not curable. It sometimes affects the eyes (ocular rosacea) as well. It may respond to topical and/or systemic medication and can flare with stress, sun exposure, alcohol, exercise, topical steroids (including hydrocortisone/cortisone 10) and some foods.  Daily application of broad spectrum spf 30+ sunscreen to face is recommended to reduce flares.  Start metronidazole 0.75% cream Apply to nose, cheeks QD/BID for rosacea dsp 45g 2Rf.  metroNIDAZOLE (METROCREAM) 0.75 % cream - Mid Tip of Nose Apply to nose, cheeks 1 to 2 times daily for rosacea.  Tinea unguium left great toenail  Chronic and persistent condition with duration or expected duration over one year. Condition is symptomatic / bothersome to patient. Not to goal.  Pt has failed PO fluconazole weekly x 45 weeks.  Pt has upcoming labs with PCP.   Start terbinafine 250 MG take 1 po QD with food dsp #30 0Rf.  Terbinafine Counseling  Terbinafine is an anti-fungal medicine that can be applied to the skin (over the counter) or taken by mouth (prescription) to treat fungal infections. The pill version is often used to treat fungal infections of the nails or scalp. While most people do not have any side effects from taking terbinafine pills, some possible side effects of the medicine can include taste changes, headache, loss of smell, vision changes, nausea, vomiting, or diarrhea.   Rare side effects can include irritation of the liver,  allergic reaction, or decrease in blood counts (which may show up as not feeling well or developing an infection). If you are concerned about any of these side effects, please stop the medicine and call your doctor, or in the case of an emergency such as feeling very unwell, seek immediate medical care.     terbinafine (LAMISIL) 250 MG tablet - left great toenail Take 1 tablet (250 mg total) by mouth daily. Take with food.  Seborrheic keratosis L post upper thigh  Stable compared to previous exam. Reassured benign age-related growth.  Recommend observation.  Discussed cryotherapy if spot(s) become irritated or inflamed.  Nevus (2) L lower abdomen inguinal; L foot dorsum  Benign-appearing.  Present for years per patient. Observation.  Call clinic for new or changing moles.  Recommend daily use of broad spectrum spf 30+ sunscreen to sun-exposed areas.    Return for Blue light w/debridement to lower legs x 2, 1 mo apart; 1 mo f/u Dr Chauncey Cruel tinea.Lindi Adie,  CMA, am acting as scribe for Brendolyn Patty, MD .  Documentation: I have reviewed the above documentation for accuracy and completeness, and I agree with the above.  Brendolyn Patty MD

## 2022-04-28 NOTE — Patient Instructions (Addendum)
Cryotherapy Aftercare  Wash gently with soap and water everyday.   Apply Vaseline and Band-Aid daily until healed.   Rosacea  What is rosacea? Rosacea (say: ro-zay-sha) is a common skin disease that usually begins as a trend of flushing or blushing easily.  As rosacea progresses, a persistent redness in the center of the face will develop and may gradually spread beyond the nose and cheeks to the forehead and chin.  In some cases, the ears, chest, and back could be affected.  Rosacea may appear as tiny blood vessels or small red bumps that occur in crops.  Frequently they can contain pus, and are called "pustules".  If the bumps do not contain pus, they are referred to as "papules".  Rarely, in prolonged, untreated cases of rosacea, the oil glands of the nose and cheeks may become permanently enlarged.  This is called rhinophyma, and is seen more frequently in men.  Signs and Risks In its beginning stages, rosacea tends to come and go, which makes it difficult to recognize.  It can start as intermittent flushing of the face.  Eventually, blood vessels may become permanently visible.  Pustules and papules can appear, but can be mistaken for adult acne.  People of all races, ages, genders and ethnic groups are at risk of developing rosacea.  However, it is more common in women (especially around menopause) and adults with fair skin between the ages of 40 and 8.  Treatment Dermatologists typically recommend a combination of treatments to effectively manage rosacea.  Treatment can improve symptoms and may stop the progression of the rosacea.  Treatment may involve both topical and oral medications.  The tetracycline antibiotics are often used for their anti-inflammatory effect; however, because of the possibility of developing antibiotic resistance, they should not be used long term at full dose.  For dilated blood vessels the options include electrodessication (uses electric current through a small  needle), laser treatment, and cosmetics to hide the redness.   With all forms of treatment, improvement is a slow process, and patients may not see any results for the first 3-4 weeks.  It is very important to avoid the sun and other triggers.  Patients must wear sunscreen daily.  Skin Care Instructions: Cleanse the skin with a mild soap such as CeraVe cleanser, Cetaphil cleanser, or Dove soap once or twice daily as needed. Moisturize with Eucerin Redness Relief Daily Perfecting Lotion (has a subtle green tint), CeraVe Moisturizing Cream, or Oil of Olay Daily Moisturizer with sunscreen every morning and/or night as recommended. Makeup should be "non-comedogenic" (won't clog pores) and be labeled "for sensitive skin". Good choices for cosmetics are: Neutrogena, Almay, and Physician's Formula.  Any product with a green tint tends to offset a red complexion. If your eyes are dry and irritated, use artificial tears 2-3 times per day and cleanse the eyelids daily with baby shampoo.  Have your eyes examined at least every 2 years.  Be sure to tell your eye doctor that you have rosacea. Alcoholic beverages tend to cause flushing of the skin, and may make rosacea worse. Always wear sunscreen, protect your skin from extreme hot and cold temperatures, and avoid spicy foods, hot drinks, and mechanical irritation such as rubbing, scrubbing, or massaging the face.  Avoid harsh skin cleansers, cleansing masks, astringents, and exfoliation. If a particular product burns or makes your face feel tight, then it is likely to flare your rosacea. If you are having difficulty finding a sunscreen that you can tolerate,  you may try switching to a chemical-free sunscreen.  These are ones whose active ingredient is zinc oxide or titanium dioxide only.  They should also be fragrance free, non-comedogenic, and labeled for sensitive skin. Rosacea triggers may vary from person to person.  There are a variety of foods that have been  reported to trigger rosacea.  Some patients find that keeping a diary of what they were doing when they flared helps them avoid triggers.   Due to recent changes in healthcare laws, you may see results of your pathology and/or laboratory studies on MyChart before the doctors have had a chance to review them. We understand that in some cases there may be results that are confusing or concerning to you. Please understand that not all results are received at the same time and often the doctors may need to interpret multiple results in order to provide you with the best plan of care or course of treatment. Therefore, we ask that you please give [place] 2 business days to thoroughly review all your results before contacting the office for clarification. Should we see a critical lab result, you will be contacted sooner.   If You Need Anything After Your Visit  If you have any questions or concerns for your doctor, please call our main line at [phone] and press option 4 to reach your doctor's medical assistant. If no one answers, please leave a voicemail as directed and we will return your call as soon as possible. Messages left after 4 pm will be answered the following business day.   You may also send [place] a message via [place]. We typically respond to MyChart messages within 1-2 business days.  For prescription refills, please ask your pharmacy to contact our office. Our fax number is [phone].  If you have an urgent issue when the clinic is closed that cannot wait until the next business day, you can page your doctor at the number below.    Please note that while we do our best to be available for urgent issues outside of office hours, we are not available 24/7.   If you have an urgent issue and are unable to reach [place], you may choose to seek medical care at your doctor's office, retail clinic, urgent care center, or emergency room.  If you have a medical emergency, please immediately call 911 or  go to the emergency department.  Pager Numbers  - Dr. Nehemiah Massed: [phone]  - Dr. Laurence Ferrari: [phone]  - Dr. Nicole Kindred: [phone]  In the event of inclement weather, please call our main line at [phone] for an update on the status of any delays or closures.  Dermatology Medication Tips: Please keep the boxes that topical medications come in in order to help keep track of the instructions about where and how to use these. Pharmacies typically print the medication instructions only on the boxes and not directly on the medication tubes.   If your medication is too expensive, please contact our office at [phone] option 4 or send [place] a message through [place].   We are unable to tell what your co-pay for medications will be in advance as this is different depending on your insurance coverage. However, we may be able to find a substitute medication at lower cost or fill out paperwork to get insurance to cover a needed medication.   If a prior authorization is required to get your medication covered by your insurance company, please allow [place] 1-2 business days to complete this process.  Drug prices often vary depending on where the prescription is filled and some pharmacies may offer cheaper prices.  The website www.goodrx.com contains coupons for medications through different pharmacies. The prices here do not account for what the cost may be with help from insurance (it may be cheaper with your insurance), but the website can give you the price if you did not use any insurance.  - You can print the associated coupon and take it with your prescription to the pharmacy.  - You may also stop by our office during regular business hours and pick up a GoodRx coupon card.  - If you need your prescription sent electronically to a different pharmacy, notify our office through [facility] or by phone at [phone] option 4.     Si Usted Necesita Algo Despus de Su Visita  Tambin  puede enviarnos un mensaje a travs de Pharmacist, community. Por lo general respondemos a los mensajes de MyChart en el transcurso de 1 a 2 das hbiles.  Para renovar recetas, por favor pida a su farmacia que se ponga en contacto con nuestra oficina. Harland Dingwall de fax es [place] [phone].  Si tiene un asunto urgente cuando la clnica est cerrada y que no puede esperar hasta el siguiente da hbil, puede llamar/localizar a su doctor(a) al nmero que aparece a continuacin.   Por favor, tenga en cuenta que aunque hacemos todo lo posible para estar disponibles para asuntos urgentes fuera del horario de [place], no estamos disponibles las 24 horas del da, los 7 das de la [place].   Si tiene un problema urgente y no puede comunicarse con nosotros, puede optar por buscar atencin mdica  en el consultorio de su doctor(a), en una clnica privada, en un centro de atencin urgente o en una sala de emergencias.  Si tiene Engineering geologist, por favor llame inmediatamente al 911 o vaya a la sala de emergencias.  Nmeros de bper  - Dr. Nehemiah Massed: [phone]  - Dra. Moye: [phone]  - Dra. Nicole Kindred: [phone]  En caso de inclemencias del [place], por favor llame a Johnsie Kindred principal al [phone] para una actualizacin sobre el [place] de cualquier retraso o cierre.  Consejos para la medicacin en dermatologa: Por favor, guarde las cajas en las que vienen los medicamentos de uso tpico para ayudarle a seguir las instrucciones sobre dnde y cmo usarlos. Las farmacias generalmente imprimen las instrucciones del medicamento slo en las cajas y no directamente en los tubos del [place].   Si su medicamento es muy caro, por favor, pngase en contacto con Zigmund Daniel llamando al [phone] y presione la opcin 4 o envenos un mensaje a travs de Pharmacist, community.   No podemos decirle cul ser su copago por los medicamentos por adelantado ya que esto es diferente dependiendo de la cobertura de su  seguro. Sin embargo, es posible que podamos encontrar un medicamento sustituto a Electrical engineer un formulario para que el seguro cubra el medicamento que se considera necesario.   Si se requiere una autorizacin previa para que su compaa de seguros [place] su medicamento, por favor permtanos de 1 a 2 das hbiles para completar este proceso.  Los precios de los medicamentos varan con frecuencia dependiendo del Environmental consultant de dnde se surte la receta y alguna farmacias pueden ofrecer precios ms baratos.  El sitio web www.goodrx.com tiene cupones para medicamentos de Airline pilot. Los precios aqu no tienen en cuenta lo que podra costar con la ayuda del seguro (puede ser ms  barato con su seguro), pero el sitio web puede darle el precio si no Field seismologist.  - Puede imprimir el cupn correspondiente y llevarlo con su receta a la farmacia.  - Tambin puede pasar por nuestra oficina durante el horario de atencin regular y Charity fundraiser una tarjeta de cupones de GoodRx.  - Si necesita que su receta se enve electrnicamente a una farmacia diferente, informe a nuestra oficina a travs de MyChart de [place] o por telfono llamando al [phone] y presione la opcin 4.

## 2022-05-26 ENCOUNTER — Ambulatory Visit (INDEPENDENT_AMBULATORY_CARE_PROVIDER_SITE_OTHER): Payer: Medicare Other | Admitting: Dermatology

## 2022-05-26 DIAGNOSIS — L570 Actinic keratosis: Secondary | ICD-10-CM | POA: Diagnosis not present

## 2022-05-26 MED ORDER — AMINOLEVULINIC ACID HCL 20 % EX SOLR
2.0000 | Freq: Once | CUTANEOUS | Status: AC
  Administered 2022-05-26: 708 mg via TOPICAL

## 2022-05-26 NOTE — Patient Instructions (Signed)

## 2022-05-26 NOTE — Progress Notes (Signed)
Patient completed PDT therapy today. With debridement  1. AK (actinic keratosis) (2) Left Lower Leg - Anterior; Right Lower Leg - Anterior  Photodynamic therapy - Left Lower Leg - Anterior, Right Lower Leg - Anterior Procedure discussed: discussed risks, benefits, side effects. and alternatives   Prep: site scrubbed/prepped with acetone   Debridement needed: Yes   Location:  Legs Number of lesions:  Multiple Type of treatment:  Blue light Aminolevulinic Acid (see MAR for details): Levulan Number of Levulan sticks used:  2 Incubation time (minutes):  120 Number of minutes under lamp:  16 Number of seconds under lamp:  40 Cooling:  Floor fan and fan Outcome: patient tolerated procedure well with no complications   Post-procedure details: sunscreen applied    Aminolevulinic Acid HCl 20 % SOLR 708 mg - Left Lower Leg - Anterior, Right Lower Leg - Anterior   Johnsie Kindred, RMA  I personally debrided area prior to application of aminolevulinic acid  Brendolyn Patty MD  Documentation: I have reviewed the above documentation for accuracy and completeness, and I agree with the above.  Brendolyn Patty MD

## 2022-06-29 ENCOUNTER — Ambulatory Visit (INDEPENDENT_AMBULATORY_CARE_PROVIDER_SITE_OTHER): Payer: Medicare Other | Admitting: Dermatology

## 2022-06-29 DIAGNOSIS — L570 Actinic keratosis: Secondary | ICD-10-CM | POA: Diagnosis not present

## 2022-06-29 MED ORDER — AMINOLEVULINIC ACID HCL 20 % EX SOLR
2.0000 | Freq: Once | CUTANEOUS | Status: AC
  Administered 2022-06-29: 708 mg via TOPICAL

## 2022-06-29 NOTE — Progress Notes (Signed)
Patient completed blue light phototherapy today with debridement.  2nd treatment.  ACTINIC KERATOSES Exam: Erythematous thin papules/macules with gritty scale.  Treatment Plan:  Blue Light Photodynamic therapy  Procedure discussed: discussed risks, benefits, side effects. and alternatives   Prep: site scrubbed/prepped with acetone   Debridement needed: Yes (performed by Physician with sand paper.  (CPT [id number])) Location:  legs Number of lesions:  Multiple (> 15) Type of treatment:  Blue light Aminolevulinic Acid (see MAR for details): Levulan Aminolevulinic Acid comment:  [id number] Amount of Levulan (mg):  1 Incubation time (minutes):  120 Number of minutes under lamp:  16:40 Cooling:  Fan Outcome: patient tolerated procedure well with no complications   Post-procedure details: sunscreen applied and aftercare instructions given to patient     [place] Desanctis  I personally debrided area prior to application of aminolevulinic acid.  Willeen Niece MD  Documentation: I have reviewed the above documentation for accuracy and completeness, and I agree with the above. Willeen Niece MD  ASC-NURSE ROOM

## 2022-06-29 NOTE — Patient Instructions (Signed)

## 2022-07-27 ENCOUNTER — Ambulatory Visit (INDEPENDENT_AMBULATORY_CARE_PROVIDER_SITE_OTHER): Payer: Medicare Other | Admitting: Dermatology

## 2022-07-27 VITALS — BP 140/76 | HR 77

## 2022-07-27 DIAGNOSIS — D229 Melanocytic nevi, unspecified: Secondary | ICD-10-CM

## 2022-07-27 DIAGNOSIS — B351 Tinea unguium: Secondary | ICD-10-CM

## 2022-07-27 DIAGNOSIS — X32XXXA Exposure to sunlight, initial encounter: Secondary | ICD-10-CM | POA: Diagnosis not present

## 2022-07-27 DIAGNOSIS — D239 Other benign neoplasm of skin, unspecified: Secondary | ICD-10-CM

## 2022-07-27 DIAGNOSIS — W908XXA Exposure to other nonionizing radiation, initial encounter: Secondary | ICD-10-CM

## 2022-07-27 DIAGNOSIS — L719 Rosacea, unspecified: Secondary | ICD-10-CM

## 2022-07-27 DIAGNOSIS — D2371 Other benign neoplasm of skin of right lower limb, including hip: Secondary | ICD-10-CM | POA: Diagnosis not present

## 2022-07-27 DIAGNOSIS — L578 Other skin changes due to chronic exposure to nonionizing radiation: Secondary | ICD-10-CM

## 2022-07-27 DIAGNOSIS — Z86007 Personal history of in-situ neoplasm of skin: Secondary | ICD-10-CM

## 2022-07-27 NOTE — Patient Instructions (Addendum)
For temporary redness reduction Mix 1 bottle of Afrin original in 1 bottle of Cerave PM, shake well, and apply 1 hour before you want redness to be improved in the morning  Counseling for BBL / IPL / Laser and Coordination of Care Discussed the treatment option of Broad Band Light (BBL) /Intense Pulsed Light (IPL)/ Laser for skin discoloration, including brown spots and redness.  Typically we recommend at least 1-3 treatment sessions about 5-8 weeks apart for best results.  Cannot have tanned skin when BBL performed, and regular use of sunscreen is advised after the procedure to help maintain results. The patient's condition may also require "maintenance treatments" in the future.  The fee for BBL / laser treatments is $350 per treatment session for the whole face.  A fee can be quoted for other parts of the body.  Insurance typically does not pay for BBL/laser treatments and therefore the fee is an out-of-pocket cost.  Recommend daily broad spectrum sunscreen SPF 30+ to sun-exposed areas, reapply every 2 hours as needed. Call for new or changing lesions.  Staying in the shade or wearing long sleeves, sun glasses (UVA+UVB protection) and wide brim hats (4-inch brim around the entire circumference of the hat) are also recommended for sun protection.    Due to recent changes in healthcare laws, you may see results of your pathology and/or laboratory studies on MyChart before the doctors have had a chance to review them. We understand that in some cases there may be results that are confusing or concerning to you. Please understand that not all results are received at the same time and often the doctors may need to interpret multiple results in order to provide you with the best plan of care or course of treatment. Therefore, we ask that you please give [place] 2 business days to thoroughly review all your results before contacting the office for clarification. Should we see a critical lab result, you will be  contacted sooner.   If You Need Anything After Your Visit  If you have any questions or concerns for your doctor, please call our main line at [phone] and press option 4 to reach your doctor's medical assistant. If no one answers, please leave a voicemail as directed and we will return your call as soon as possible. Messages left after 4 pm will be answered the following business day.   You may also send [place] a message via MyChart. We typically respond to MyChart messages within 1-2 business days.  For prescription refills, please ask your pharmacy to contact our office. Our fax number is [phone].  If you have an urgent issue when the clinic is closed that cannot wait until the next business day, you can page your doctor at the number below.    Please note that while we do our best to be available for urgent issues outside of office hours, we are not available 24/7.   If you have an urgent issue and are unable to reach [place], you may choose to seek medical care at your doctor's office, retail clinic, urgent care center, or emergency room.  If you have a medical emergency, please immediately call 911 or go to the emergency department.  Pager Numbers  - Dr. Gwen Pounds: [phone]  - Dr. Neale Burly: [phone]  - Dr. Roseanne Reno: [phone]  In the event of inclement weather, please call our main line at [phone] for an update on the status of any delays or closures.  Dermatology Medication Tips: Please keep the  boxes that topical medications come in in order to help keep track of the instructions about where and how to use these. Pharmacies typically print the medication instructions only on the boxes and not directly on the medication tubes.   If your medication is too expensive, please contact our office at [phone] option 4 or send [place] a message through MyChart.   We are unable to tell what your co-pay for medications will be in advance as this is different depending on your  insurance coverage. However, we may be able to find a substitute medication at lower cost or fill out paperwork to get insurance to cover a needed medication.   If a prior authorization is required to get your medication covered by your insurance company, please allow [place] 1-2 business days to complete this process.  Drug prices often vary depending on where the prescription is filled and some pharmacies may offer cheaper prices.  The website www.goodrx.com contains coupons for medications through different pharmacies. The prices here do not account for what the cost may be with help from insurance (it may be cheaper with your insurance), but the website can give you the price if you did not use any insurance.  - You can print the associated coupon and take it with your prescription to the pharmacy.  - You may also stop by our office during regular business hours and pick up a GoodRx coupon card.  - If you need your prescription sent electronically to a different pharmacy, notify our office through [facility] or by phone at [phone] option 4.     Si Usted Necesita Algo Despus de Su Visita  Tambin puede enviarnos un mensaje a travs de Clinical cytogeneticist. Por lo general respondemos a los mensajes de MyChart en el transcurso de 1 a 2 das hbiles.  Para renovar recetas, por favor pida a su farmacia que se ponga en contacto con nuestra oficina. Annie Sable de fax es [place] [phone].  Si tiene un asunto urgente cuando la clnica est cerrada y que no puede esperar hasta el siguiente da hbil, puede llamar/localizar a su doctor(a) al nmero que aparece a continuacin.   Por favor, tenga en cuenta que aunque hacemos todo lo posible para estar disponibles para asuntos urgentes fuera del horario de [place], no estamos disponibles las 24 horas del da, los 7 [address]  [address] de la [place].   Si tiene un problema urgente y no puede comunicarse con nosotros, puede optar por buscar atencin mdica  en el  consultorio de su doctor(a), en una clnica privada, en un centro de atencin urgente o en una sala de emergencias.  Si tiene Engineer, drilling, por favor llame inmediatamente al 911 o vaya a la sala de emergencias.  Nmeros de bper  - Dr. Gwen Pounds: [phone]  - Dra. Moye: [phone]  - Dra. Roseanne Reno: [phone]  En caso de inclemencias del [place], por favor llame a Lacy Duverney principal al [phone] para una actualizacin sobre el [place] de cualquier retraso o cierre.  Consejos para la medicacin en dermatologa: Por favor, guarde las cajas en las que vienen los medicamentos de uso tpico para ayudarle a seguir las instrucciones sobre dnde y cmo usarlos. Las farmacias generalmente imprimen las instrucciones del medicamento slo en las cajas y no directamente en los tubos del [place].   Si su medicamento es muy caro, por favor, pngase en contacto con Rolm Gala llamando al [phone] y presione la opcin 4 o envenos un mensaje a travs de Clinical cytogeneticist.  No podemos decirle cul ser su copago por los medicamentos por adelantado ya que esto es diferente dependiendo de la cobertura de su seguro. Sin embargo, es posible que podamos encontrar un medicamento sustituto a Audiological scientist un formulario para que el seguro cubra el medicamento que se considera necesario.   Si se requiere una autorizacin previa para que su compaa de seguros [place] su medicamento, por favor permtanos de 1 a 2 das hbiles para completar [address]  Los precios de los medicamentos varan con frecuencia dependiendo del Environmental consultant de dnde se surte la receta y alguna farmacias pueden ofrecer precios ms baratos.  El sitio web www.goodrx.com tiene cupones para medicamentos de Health and safety inspector. Los precios aqu no tienen en cuenta lo que podra costar con la ayuda del seguro (puede ser ms barato con su seguro), pero el sitio web puede darle el precio si no utiliz Tourist information centre manager.  - Puede  imprimir el cupn correspondiente y llevarlo con su receta a la farmacia.  - Tambin puede pasar por nuestra oficina durante el horario de atencin regular y Education officer, museum una tarjeta de cupones de GoodRx.  - Si necesita que su receta se enve electrnicamente a una farmacia diferente, informe a nuestra oficina a travs de MyChart de [place] o por telfono llamando al [phone] y presione la opcin 4.

## 2022-07-27 NOTE — Progress Notes (Signed)
Follow-Up Visit   Subjective  Meghan Myers is a 72 y.o. 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 Good reaction with pinkness and peeling. 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 She had failed PO fluoconazole weekly x 45 weeks, and recently completed 1 month of terbinafine, but didn't get rfs. She hasn't noticed any improvement.    The following portions of the chart were reviewed this encounter and updated as appropriate: medications, allergies, medical history  Review of Systems:  No other skin or systemic complaints except as noted in HPI or Assessment and Plan.  Objective  Well appearing patient in no apparent distress; mood and affect are within normal limits.  A focused examination was performed of the following areas: Face, legs, arms, hands  Relevant exam findings are noted in the Assessment and Plan.  Right mid pretibia 2mm greenish-blue macule     Assessment & Plan   ACTINIC DAMAGE - chronic, secondary to cumulative UV radiation exposure/sun exposure over time - diffuse scaly erythematous macules with underlying dyspigmentation - Recommend daily broad spectrum sunscreen SPF 30+ to sun-exposed areas, reapply every 2 hours as needed.  - Recommend staying in the shade or wearing long sleeves, sun glasses (UVA+UVB protection) and wide brim hats (4-inch brim around the entire circumference of the hat). - Call for new or changing lesions. - Improved after PDT treatments x 2, some areas still fading.   Blue nevus Right mid pretibia  Benign-appearing.  Observation.  Call clinic for new or changing moles.  Recommend daily use of broad spectrum spf 30+ sunscreen to sun-exposed areas.     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  Chronic and persistent condition with duration or expected duration over one year.  Not  currently at goal.  Treatment Plan: Not bothersome to patient, so she prefers to not finish terbinafine course. She took 1 month and didn't see improvement.  Patient had previously tried and failed PO fluconazole treatment weekly x 45 weeks.  Will observe.  ROSACEA Exam Mid face erythema with telangiectasias   Chronic and persistent condition with duration or expected duration over one year. Condition is symptomatic/ bothersome to patient. Not currently at goal.  Rosacea is a chronic progressive skin condition usually affecting the face of adults, causing redness and/or acne bumps. It is treatable but not curable. It sometimes affects the eyes (ocular rosacea) as well. It may respond to topical and/or systemic medication and can flare with stress, sun exposure, alcohol, exercise, topical steroids (including hydrocortisone/cortisone 10) and some foods.  Daily application of broad spectrum spf 30+ sunscreen to face is recommended to reduce flares.  Patient denies grittiness of the eyes.  Treatment Plan  Discussed Rhofade Cream for redness, but not covered by insurance.  For temporary redness reduction Mix 1 bottle of Afrin original in 1 bottle of Cerave PM, shake well, and apply 1 hour before you want redness to be improved in the morning  Counseling for BBL / IPL / Laser and Coordination of Care Discussed the treatment option of Broad Band Light (BBL) /Intense Pulsed Light (IPL)/ Laser for skin discoloration, including brown spots and redness.  Typically we recommend at least 1-3 treatment sessions about 5-8 weeks apart for best results.  Cannot have tanned skin when BBL performed, and regular use of sunscreen is advised after  the procedure to help maintain results. The patient's condition may also require "maintenance treatments" in the future.  The fee for BBL / laser treatments is $350 per treatment session for the whole face.  A fee can be quoted for other parts of the body.  Insurance  typically does not pay for BBL/laser treatments and therefore the fee is an out-of-pocket cost.  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  - Call if any new or changing lesions are noted between office visits    Return in about 6 months (around 01/27/2023) for Hx AKs.  ICherlyn Labella, CMA, am acting as scribe for Willeen Niece, MD .   Documentation: I have reviewed the above documentation for accuracy and completeness, and I agree with the above.  Willeen Niece, MD

## 2022-08-10 ENCOUNTER — Encounter: Payer: Self-pay | Admitting: Neurology

## 2022-08-10 ENCOUNTER — Ambulatory Visit (INDEPENDENT_AMBULATORY_CARE_PROVIDER_SITE_OTHER): Payer: Medicare Other | Admitting: Neurology

## 2022-08-10 VITALS — BP 132/71 | HR 80 | Ht 60.5 in | Wt 122.0 lb

## 2022-08-10 DIAGNOSIS — G3184 Mild cognitive impairment, so stated: Secondary | ICD-10-CM

## 2022-08-10 NOTE — Patient Instructions (Addendum)
Dementia labs including TSH, B12 and ATN profile  Referral to neuropsych MRI Brain without contrast  Follow up in 1 year or sooner if worse    There are well-accepted and sensible ways to reduce risk for Alzheimers disease and other degenerative brain disorders .  Exercise Daily Walk A daily 20 minute walk should be part of your routine. Disease related apathy can be a significant roadblock to exercise and the only way to overcome this is to make it a daily routine and perhaps have a reward at the end (something your loved one loves to eat or drink perhaps) or a personal trainer coming to the home can also be very useful. Most importantly, the patient is much more likely to exercise if the caregiver / spouse does it with him/her. In general a structured, repetitive schedule is best.  General Health: Any diseases which effect your body will effect your brain such as a pneumonia, urinary infection, blood clot, heart attack or stroke. Keep contact with your primary care doctor for regular follow ups.  Sleep. A good nights sleep is healthy for the brain. Seven hours is recommended. If you have insomnia or poor sleep habits we can give you some instructions. If you have sleep apnea wear your mask.  Diet: Eating a heart healthy diet is also a good idea; fish and poultry instead of red meat, nuts (mostly non-peanuts), vegetables, fruits, olive oil or canola oil (instead of butter), minimal salt (use other spices to flavor foods), whole grain rice, bread, cereal and pasta and wine in moderation.Research is now showing that the MIND diet, which is a combination of The Mediterranean diet and the DASH diet, is beneficial for cognitive processing and longevity. Information about this diet can be found in The MIND Diet, a book by Alonna Minium, MS, RDN, and online at [employer]Wild[employer].es  Finances, Power of [address] and Advance Directives: You should consider putting legal safeguards in  place with regard to financial and medical decision making. While the spouse always has power of attorney for medical and financial issues in the absence of any form, you should consider what you want in case the spouse / caregiver is no longer around or capable of making decisions.
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
## 2022-08-11 ENCOUNTER — Telehealth: Payer: Self-pay | Admitting: Neurology

## 2022-08-11 LAB — VITAMIN B12: Vitamin B-12: 556 pg/mL (ref 232–1245)

## 2022-08-11 LAB — TSH: TSH: 2.08 u[IU]/mL (ref 0.450–4.500)

## 2022-08-11 NOTE — Telephone Encounter (Signed)
medicare/[employer] NPR sent to GI [phone] 

## 2022-08-11 NOTE — Telephone Encounter (Signed)
Referral to [facility] Phone: [phone] Fax: [phone]

## 2022-08-13 LAB — ATN PROFILE
A -- Beta-amyloid 42/40 Ratio: 0.092 — ABNORMAL LOW (ref >0.102)
Beta-amyloid 40: 242.84 pg/mL
Beta-amyloid 42: 22.37 pg/mL
N -- NfL, Plasma: 4.03 pg/mL (ref 0.00–7.64)
T -- p-tau181: 2.18 pg/mL — ABNORMAL HIGH (ref 0.00–0.97)

## 2022-08-16 NOTE — Progress Notes (Signed)
Thanks

## 2022-08-16 NOTE — Telephone Encounter (Signed)
[facility] said that multiple attempts were made to contact the patient to schedule and she has not called them back.

## 2022-08-18 NOTE — Telephone Encounter (Signed)
Received call from [facility] stated patient is scheduled for counseling and will be scheduled for testing in 8-12 weeks

## 2022-08-25 ENCOUNTER — Encounter: Payer: Self-pay | Admitting: Physical Therapy

## 2022-08-25 ENCOUNTER — Ambulatory Visit: Payer: Medicare Other | Attending: Registered Nurse | Admitting: Physical Therapy

## 2022-08-25 DIAGNOSIS — M533 Sacrococcygeal disorders, not elsewhere classified: Secondary | ICD-10-CM | POA: Insufficient documentation

## 2022-08-25 DIAGNOSIS — M4125 Other idiopathic scoliosis, thoracolumbar region: Secondary | ICD-10-CM | POA: Diagnosis present

## 2022-08-25 DIAGNOSIS — R2689 Other abnormalities of gait and mobility: Secondary | ICD-10-CM | POA: Insufficient documentation

## 2022-08-25 NOTE — Patient Instructions (Signed)
Avoid straining pelvic floor, abdominal muscles , spine  Use log rolling technique instead of getting out of bed with your neck or the sit-up     Log rolling into and out of bed   Log rolling into and out of bed If getting out of bed on R side, Bent knees, scoot hips/ shoulder to L  Raise R arm completely overhead, rolling onto armpit  Then lower bent knees to bed to get into complete side lying position  Then drop legs off bed, and push up onto R elbow/forearm, and use L hand to push onto the bed  __   Proper body mechanics with getting out of a chair to decrease strain  on back &pelvic floor   Avoid holding your breath when Getting out of the chair:  Scoot to front part of chair chair Heels behind knees, feet are hip width apart, nose over toes  Inhale like you are smelling roses Exhale to stand   __  Sitting with feet on ground, four points of contact Catch yourself crossing ankles and thighs  __  

## 2022-08-25 NOTE — Therapy (Signed)
OUTPATIENT PHYSICAL THERAPY EVALUATION   Patient Name: Meghan Myers MRN: [id number] DOB:17-Oct-1950, 72 y.o., female Today's Date: 08/25/2022   PT End of Session - 08/25/22 1559     Visit Number 1    Date for PT Re-Evaluation 11/03/22    PT Start Time 1550    PT Stop Time 1630    PT Time Calculation (min) 40 min    Activity Tolerance Patient tolerated treatment well;No increased pain    Behavior During Therapy WFL for tasks assessed/performed             Past Medical History:  Diagnosis Date   Alzheimer disease (HCC)    Cancer (HCC) 10/2016   kidney ca   Chronic kidney disease    Depression    History of kidney stones    X1   HLD (hyperlipidemia)    HOH (hard of hearing)    wears aids   Hypertension    Liver disease    Osteoarthritis    Rheumatoid arthritis (HCC)    Shingles    Squamous cell carcinoma of skin 01/27/2015   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    Past Surgical History:  Procedure Laterality Date   BACK SURGERY     L5 hemilaminectomy   BREAST EXCISIONAL BIOPSY Right 2000?   benign   BUNIONECTOMY Bilateral    CATARACT EXTRACTION W/PHACO Left 04/12/2022   Procedure: CATARACT EXTRACTION PHACO AND INTRAOCULAR LENS PLACEMENT (IOC) LEFT;  Surgeon: Nevada Crane, MD;  Location: [facility];  Service: Ophthalmology;  Laterality: Left;  4.75 0:33.4   CATARACT EXTRACTION W/PHACO Right 04/26/2022   Procedure: CATARACT EXTRACTION PHACO AND INTRAOCULAR LENS PLACEMENT (IOC) RIGHT  3.19  00:25.6;  Surgeon: Nevada Crane, MD;  Location: [facility];  Service: Ophthalmology;  Laterality: Right;   FACIAL FRACTURE SURGERY     FACIAL LACERATION REPAIR     FOOT SURGERY     from childhood accident   KIDNEY SURGERY     KNEE ARTHROPLASTY Left 11/26/2020   Procedure: COMPUTER ASSISTED TOTAL KNEE ARTHROPLASTY - RNFA;  Surgeon: Donato Heinz, MD;  Location: [facility];  Service: Orthopedics;  Laterality: Left;   KNEE ARTHROSCOPY Left  05/05/2020   Procedure: ARTHROSCOPY KNEE;  Surgeon: Donato Heinz, MD;  Location: [facility];  Service: Orthopedics;  Laterality: Left;   LUMBAR LAMINECTOMY/DECOMPRESSION MICRODISCECTOMY Right 02/06/2018   Procedure: LUMBAR HEMI-LAMINECTOMY AND FORAMINOTOMYWITH DISCECTOMY 1 LEVEL L4-5;  Surgeon: Lucy Chris, MD;  Location: [facility];  Service: Neurosurgery;  Laterality: Right;   ROTATOR CUFF REPAIR Right    TONSILLECTOMY     TRIGGER FINGER RELEASE Bilateral    URETHRAL DILATION     WRIST SURGERY     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/08/2017   Hx of renal cell carcinoma 03/22/2017   Fracture of multiple ribs of left side 11/14/2016   Osteoarthritis 11/14/2016   Pneumothorax on left 11/14/2016   Rheumatoid arthritis (HCC) 11/14/2016   Attention deficit hyperactivity disorder (ADHD), predominantly inattentive type 03/19/2016   Leukocytosis 10/10/2014   Hypokalemia 10/10/2014   Cat bite of right forearm 10/08/2014   Cellulitis of arm, right 10/08/2014   Depression 10/08/2014   HLD (hyperlipidemia) 10/08/2014   HTN (hypertension) 10/08/2014    PCP: Burnadette Pop  REFERRING PROVIDER: Mayer Masker . NP   REFERRING DIAG:  N81.11 (ICD-10-CM) - Cystocele, midline  N81.2 (ICD-10-CM) - Incomplete uterovaginal prolapse  N95.8 (ICD-10-CM) - Other specified menopausal and perimenopausal disorders  R15.9 (ICD-10-CM) - Full incontinence of feces  R15.2 (ICD-10-CM) - Fecal urgency  N32.81 (ICD-10-CM) - Overactive bladder    Rationale for Evaluation and Treatment Rehabilitation  THERAPY DIAG:  Other abnormalities of gait and mobility  Other idiopathic scoliosis, thoracolumbar region  Sacrococcygeal disorders, not elsewhere  classified  ONSET DATE:   SUBJECTIVE:                                                                                                                                                                                           SUBJECTIVE STATEMENT on eval 08/25/22 : 1) multiple falls:  "plenty" more than 10. Landed on L knee which she had surgery. Able to pick herself up after falls.  Pt has had broken her L arm 4 times. Pt does not remember when her last bone density was.    2) both fecal and urinary leakage before making it to the bathroom: yesterday she felt the urge to go but she had already gone and did not realize it. Lately this has increased a couple of time a week compared to not happening. Pt also has had a recent Dx of Alzheimer's.     difficulty eliminating both urine and bowels prior pessary. Pt is not able to remove the pessary herself. Next removal in 3 weeks ( mid July)   3) LBP - achy ness 3-4/10 after doing lots of things all day, standing for long periods.  Pt does yard work, walk, mow, lifted [employer] for 25 years. Pt stopped going to the gym to strengthen her knee but stopped because she was afraid of hurting herself.    PERTINENT HISTORY:  Recent Dx of Alzheimer, knee and back surgeries, falls Hx,   PAIN:  Are you having pain? See above    PRECAUTIONS: None  WEIGHT BEARING RESTRICTIONS: No  FALLS:  Has patient fallen in last 6 months? Yes., "plenty" more than 10. Landed on L knee which she had surgery. Able to pick herself up after falls   LIVING ENVIRONMENT: Lives with: lives with their spouse Lives in: House/apartment Stairs: 2 stories  Has following equipment at home: Single point cane  OCCUPATION:   PLOF: Independent  PATIENT GOALS:  Hoping to have strong abdominal, avoid wearing pessary     OBJECTIVE:    [facility] PT Assessment - 08/25/22 1625       Observation/Other Assessments   Scoliosis R shoulder and R iliac crest  lowered,      Strength    Overall Strength Comments R hip 3/5, L 4/5, knee ext/flex 4/5 B      Palpation   Spinal mobility limited R sidebend / rotation    SI assessment  supine and standing, R iliac crest lowered, malleoli lowered      Ambulation/Gait   Gait Comments 1.15 m/s, decreased L stance, R trunk lean,             [facility] Adult PT Treatment/Exercise - 08/25/22 1625       Therapeutic Activites    Therapeutic Activities Other Therapeutic Activities    Other Therapeutic Activities explained pelvic PT for her prolapse, bowel and bladder and LBP issues , scoliosis will be addressed      Neuro Re-ed    Neuro Re-ed Details  cued for body emchanics              HOME EXERCISE PROGRAM: See pt instruction section    ASSESSMENT:  CLINICAL IMPRESSION:  Pt is a 72 yo who presents with multiple falls, urge incontinence with bowel and urine, LBP which impact QOL, ADL, hobbies, gardening, yard work, and community activities.   Pt's musculoskeletal assessment revealed uneven pelvic girdle and shoulder height, asymmetries to gait pattern, limited spinal /pelvic mobility, dyscoordination and strength of pelvic floor mm, hip weakness, poor body mechanics which places strain on the abdominal/pelvic floor mm. These are deficits that indicate an ineffective intraabdominal pressure system associated with increased risk for pt's Sx.    Pt will benefit from coordination training and education on fitness and functional positions in order to gain a more effective intraabdominal pressure system to improve balance to minimize falls and promote pelvic function.  Pt was provided education on etiology of Sx with anatomy, physiology explanation with images along with the benefits of customized pelvic PT Tx based on pt's medical conditions and musculoskeletal deficits.  Explained the physiology of deep core mm coordination and roles of pelvic floor function in urination, defecation, sexual function, and postural control with  deep core mm system.   Regional interdependent approaches will yield greater benefits in pt's POC due to the complexity of pt's medical Hx and the significant impact their Sx have had on their QOL.   Recent Dx of Alzheimer, knee and back surgeries, falls Hx impact pt's prognosis.   Following Tx today which pt tolerated without complaints,  pt demo'd proper body mechanics to minimize straining pelvic floor.     Plan to address realignment of spine/ pelvis/scoliosis at next session to help promote balance and minimize falls. Plan to address pelvic floor issues once pelvis and spine are realigned to yield better outcomes.    Pt benefits from skilled PT.    OBJECTIVE IMPAIRMENTS decreased activity tolerance, decreased coordination, decreased endurance, decreased mobility, difficulty walking, decreased ROM, decreased strength, decreased safety awareness, hypomobility, increased muscle spasms, impaired flexibility, improper body mechanics, postural dysfunction, and pain. scar restrictions   ACTIVITY LIMITATIONS  self-care,  sleep, home chores, work tasks    PARTICIPATION LIMITATIONS:  community, gym activities    PERSONAL FACTORS   Alzheimer affecting patient's functional outcome.    REHAB POTENTIAL: Good   CLINICAL DECISION MAKING: Evolving/moderate complexity   EVALUATION COMPLEXITY: Moderate    PATIENT EDUCATION:    Education details: Showed pt anatomy images. Explained muscles attachments/ connection, physiology of deep core system/ spinal- thoracic-pelvis-lower kinetic chain as they relate to pt's presentation, Sx, and past Hx. Explained what and how these areas  of deficits need to be restored to balance and function    See Therapeutic activity / neuromuscular re-education section  Answered pt's questions.   Person educated: Patient Education method: Explanation, Demonstration, Tactile cues, Verbal cues, and Handouts Education comprehension: verbalized understanding, returned  demonstration, verbal cues required, tactile cues required, and needs further education     PLAN: PT FREQUENCY: 1x/week   PT DURATION: 10 weeks   PLANNED INTERVENTIONS: Therapeutic exercises, Therapeutic activity, Neuromuscular re-education, Balance training, Gait training, Patient/Family education, Self Care, Joint mobilization, Spinal mobilization, Moist heat, Taping, and Manual therapy, dry needling.   PLAN FOR NEXT SESSION: See clinical impression for plan     GOALS: Goals reviewed with patient? Yes  SHORT TERM GOALS: Target date: 09/22/2022    Pt will demo IND with HEP                    Baseline: Not IND            Goal status: INITIAL   LONG TERM GOALS: Target date: 11/03/2022    1.Pt will demo proper deep core coordination without chest breathing and optimal excursion of diaphragm/pelvic floor in order to promote spinal stability and pelvic floor function  Baseline: dyscoordination Goal status: INITIAL  2.  Pt will demo > 5 pt change on FOTO  to improve QOL and function  PFDI Urinary baseline - 46  Lower score = better function  Urinary Problem baseline-  58  Higher score =  better function    Bowel  leakage baseline - 61   Higher score = better function  PFDI Bowel - Higher score =  38 better function  Prolapse  Lower score =  25 better function   Goal status: INITIAL  3.  Pt will demo proper body mechanics in against gravity tasks and ADLs  work tasks, fitness  to minimize straining pelvic floor / back                  Baseline: not IND, improper form that places strain on pelvic floor                Goal status: INITIAL    4. Pt will demo increased gait speed > 1.3 m/s and reciporcal gait pattern without trunk lean in order to ambulate safely in community and return to fitness routine  Baseline: 1.15 m/s, decreased L stance, R trunk lean,  Goal status: INITIAL    5. Pt will demo levelled pelvic girdle and shoulder height in order to  progress to deep core strengthening HEP and restore mobility at spine, pelvis, gait, posture and minimize falls    Baseline: R shoulder and R iliac crest lowered,  Goal status: INITIAL   6. Pt will demo increased hip strength to help pelvic function and postural stability R hip > 4/5  Baseline: R hip 3/5, L 4/5, knee ext/flex 4/5 B  Goal status: INITIAL     Mariane Masters, PT 08/25/2022, 4:04 PM

## 2022-09-20 ENCOUNTER — Telehealth: Payer: Self-pay | Admitting: Neurology

## 2022-09-20 NOTE — Telephone Encounter (Signed)
Referral faed to [facility]: Phone: [phone]   Fax: [phone]

## 2022-09-20 NOTE — Telephone Encounter (Signed)
Pt's husband, Shashana Fullington (on [place]), have not heard from anyone about the neuropsychology test. Would like a call from the nurse to discuss the results of the test and any steps going forward.

## 2022-09-26 IMAGING — CR DG HIP (WITH OR WITHOUT PELVIS) 2-3V*L*
3 series · 3 of 3 positions shown · non-contrast
Comparison: None.

CLINICAL DATA: Pain.

EXAM:
DG HIP (WITH OR WITHOUT PELVIS) 2-3V LEFT

[hip ap]
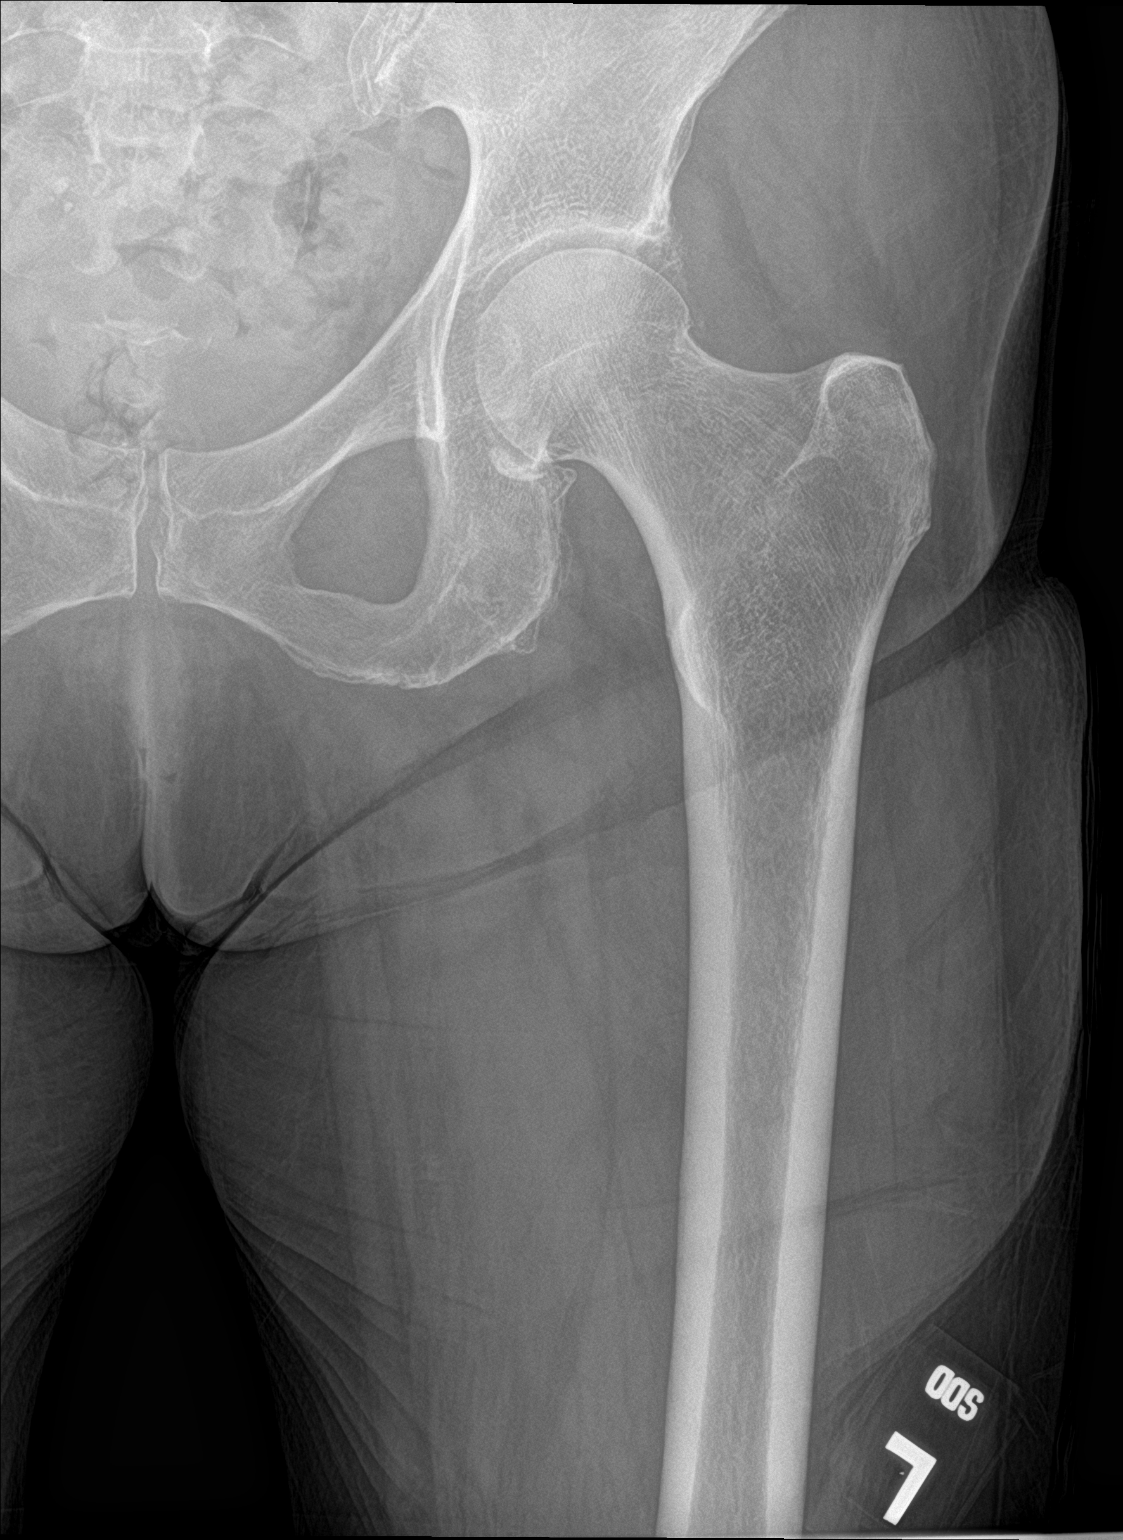

[hip lat]
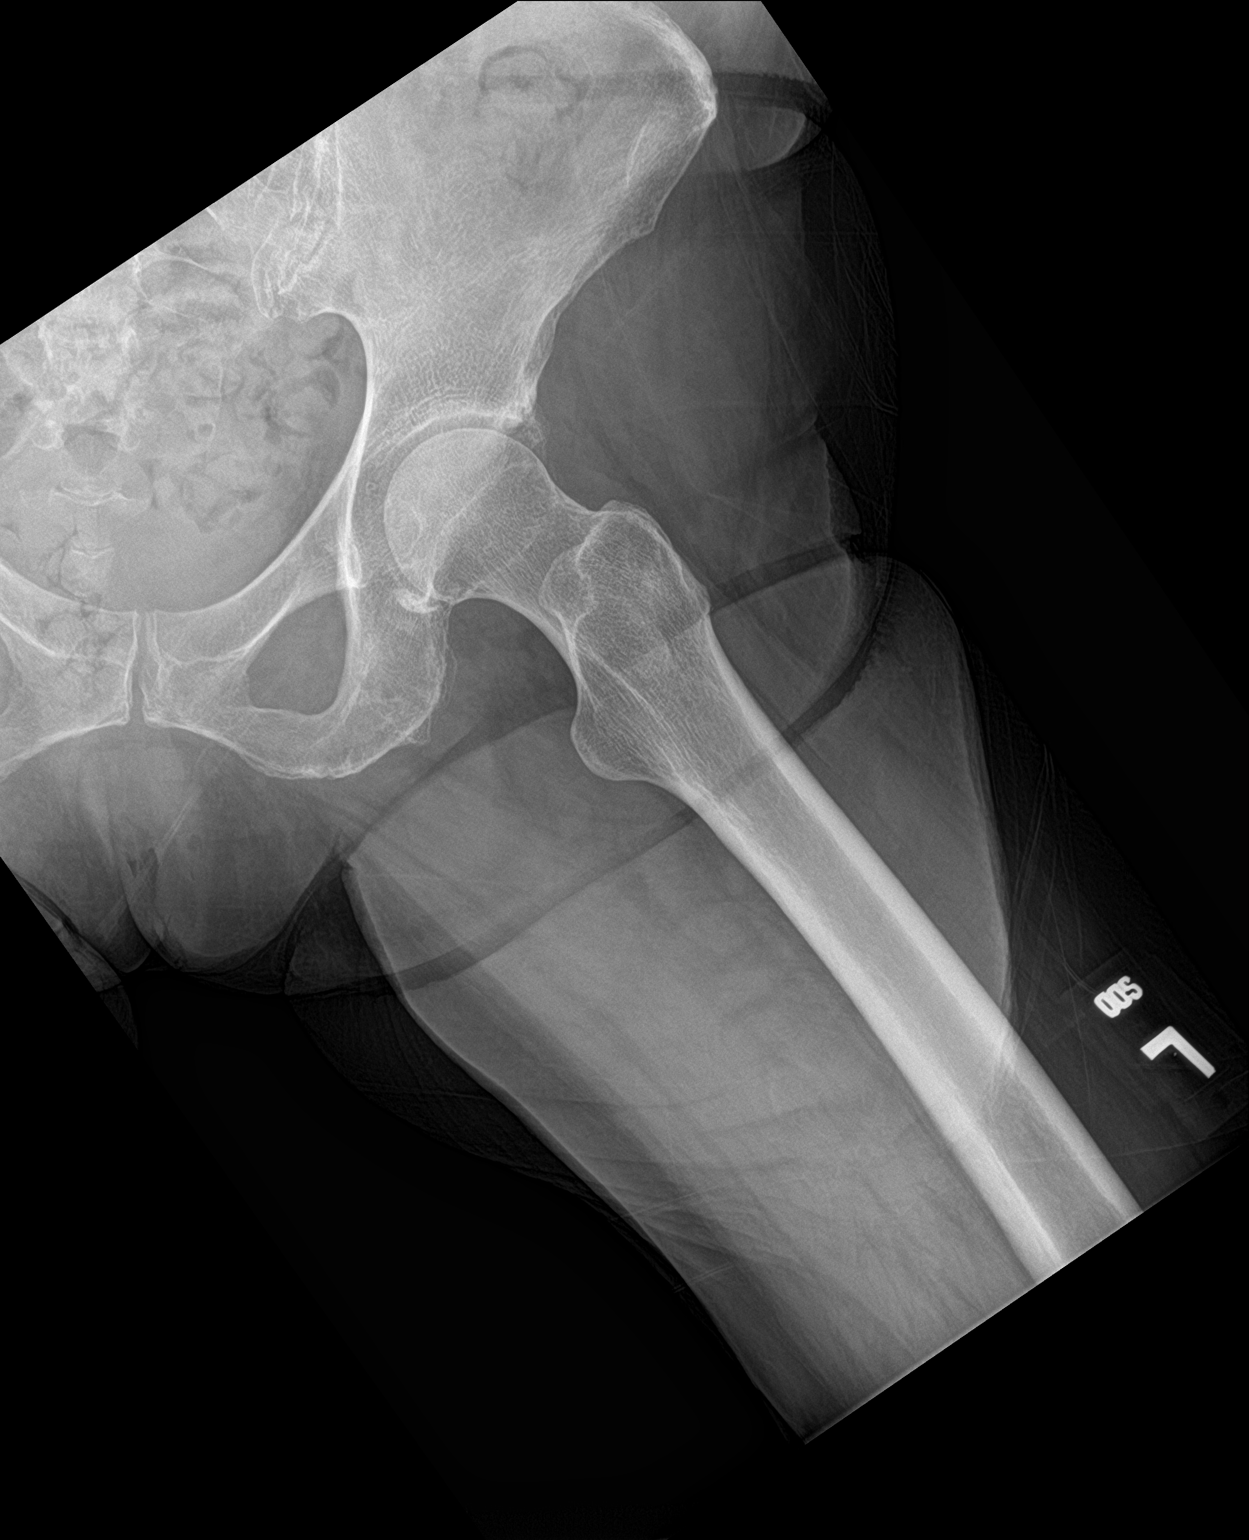

[pelvis ap]
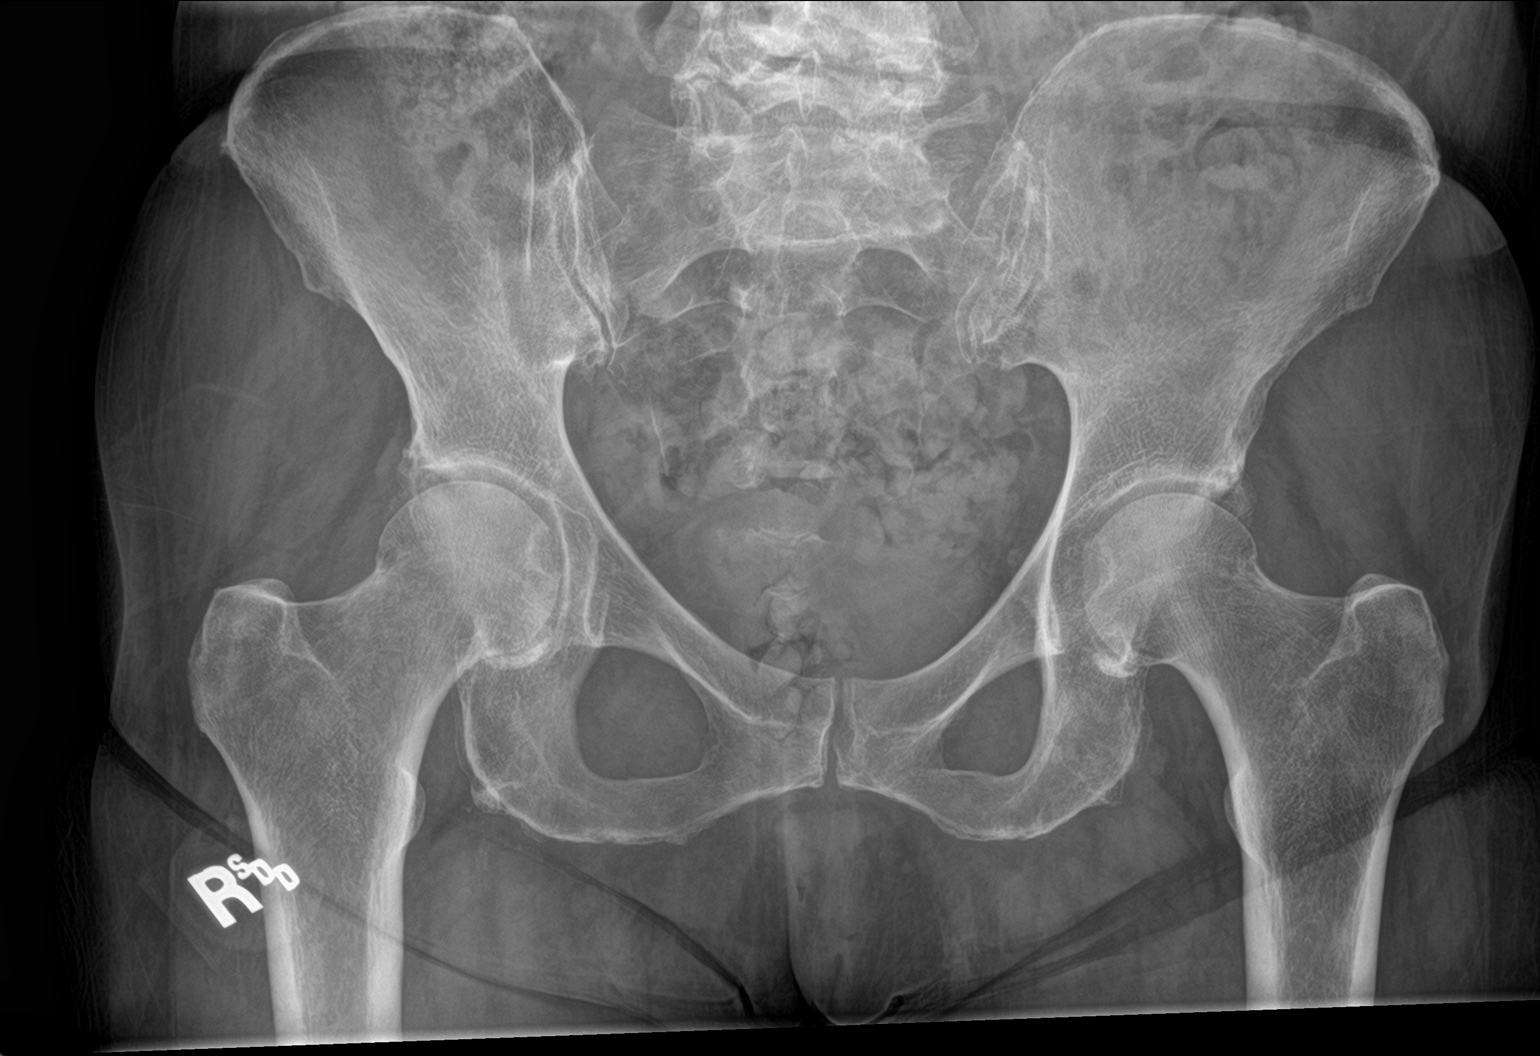

[3 of 3 positions shown; findings below may reference images not displayed]

FINDINGS: There is no evidence of hip fracture or dislocation. There is no
evidence of arthropathy or other focal bone abnormality.
Degenerative disc disease noted within the imaged portions of the
lumbar spine.
IMPRESSION: 1. No acute abnormality.
2. Lumbar spondylosis noted.

## 2022-09-26 IMAGING — US US EXTREM LOW VENOUS*L*
1 series · 13 of 24 positions shown · non-contrast
Comparison: None.

CLINICAL DATA: Left lower extremity pain for the past 4 days.
History of renal cell carcinoma. Evaluate for DVT.



[Series 1: us venous img lower uni left (dvt) · portal-venous · 13 of 38 slices shown]
[im 1/38]
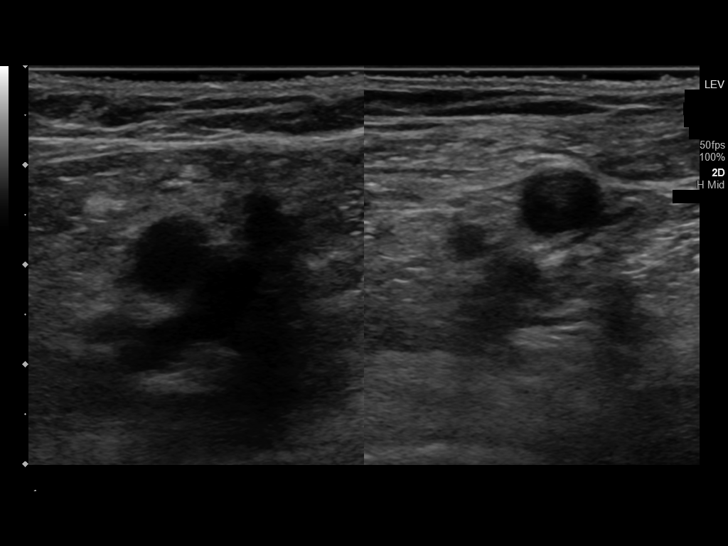
[im 4/38]
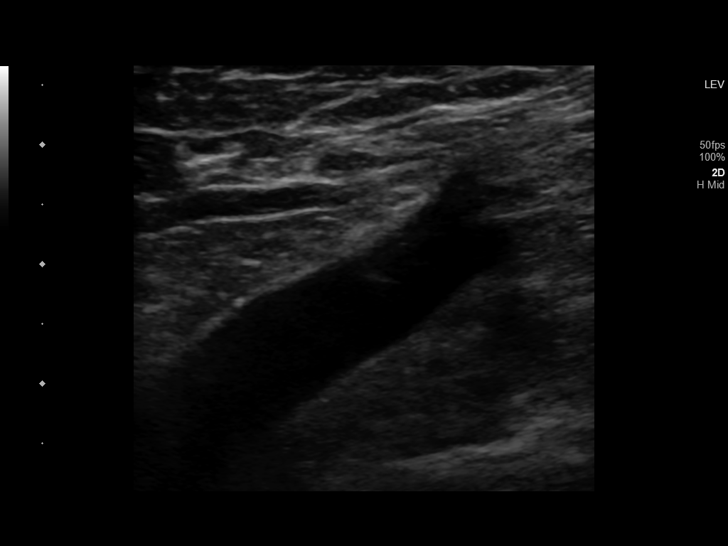
[im 7/38]
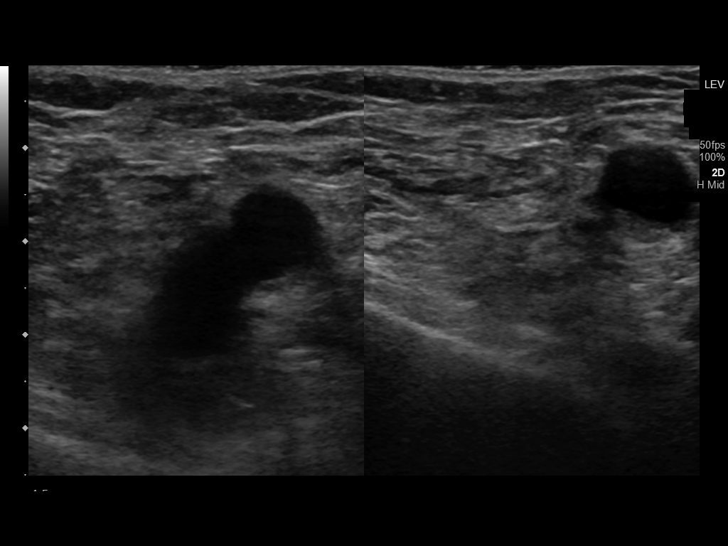
[im 10/38]
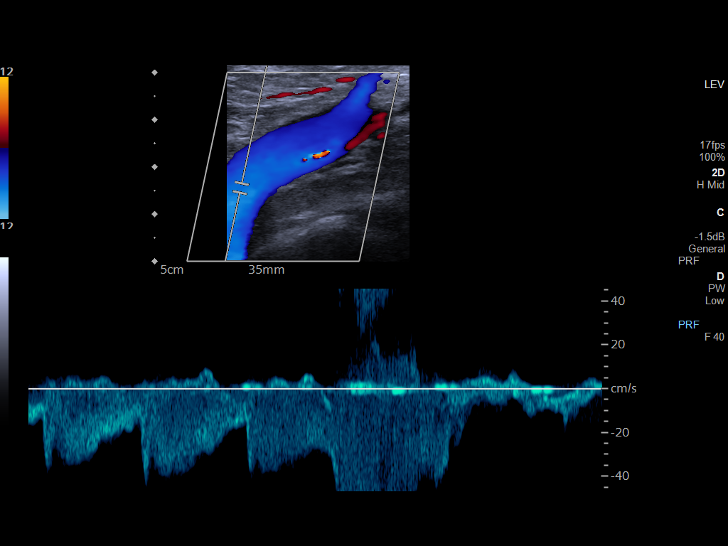
[im 13/38]
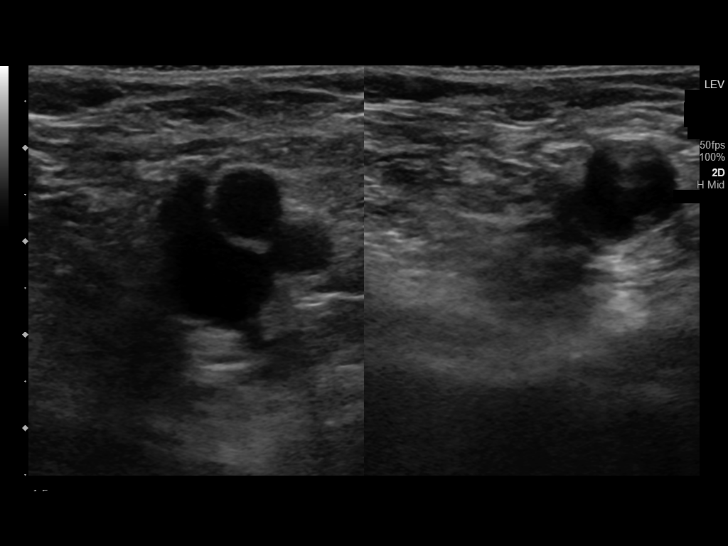
[im 17/38]
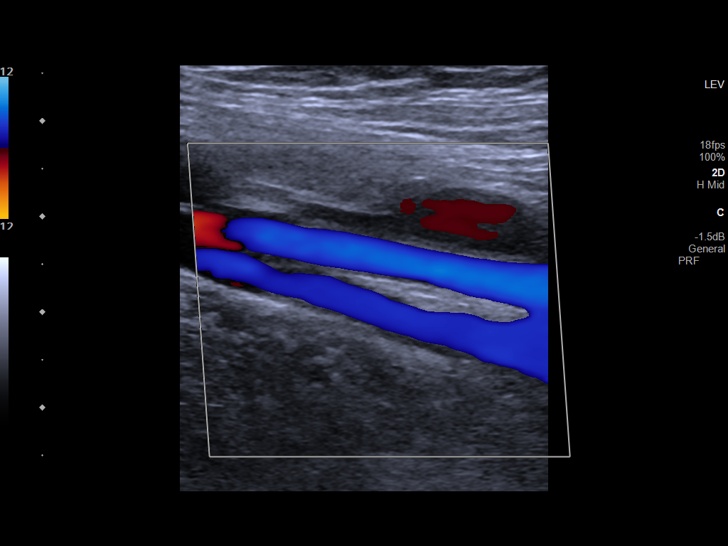
[im 20/38]
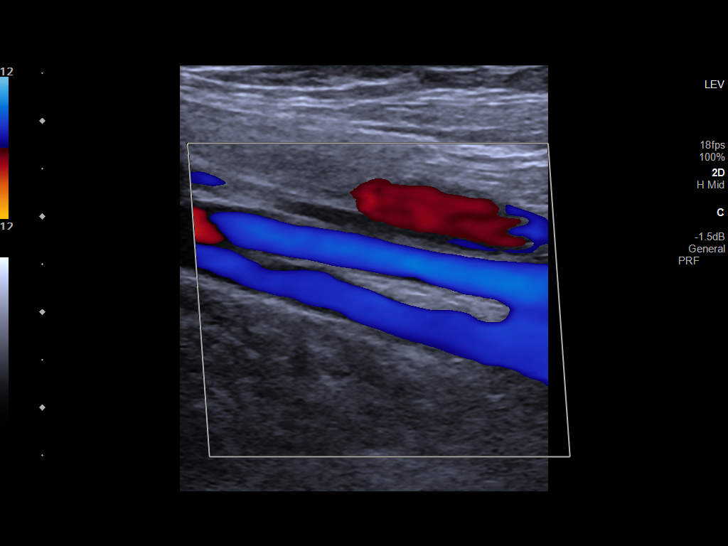
[im 21/38]
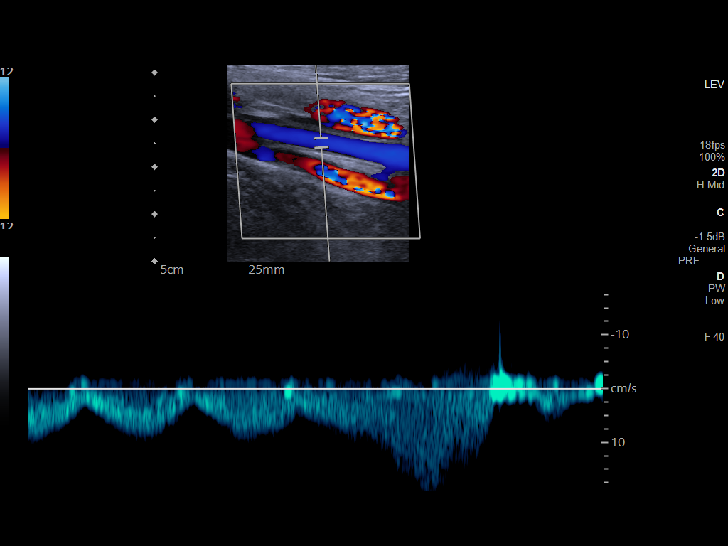
[im 25/38]
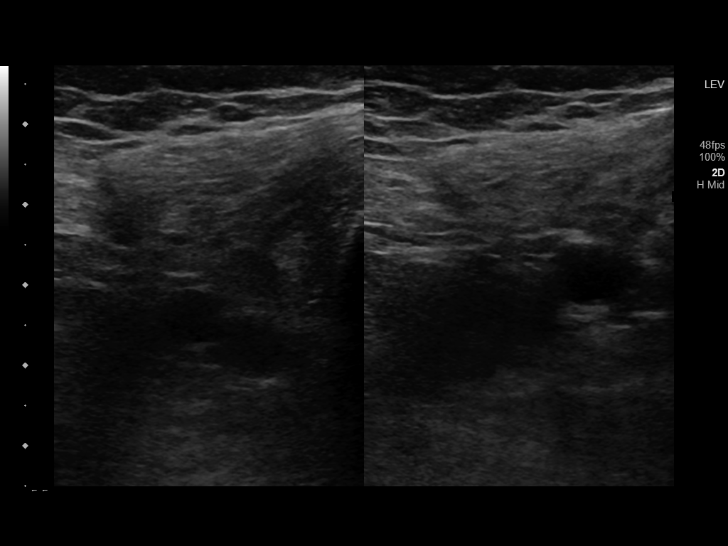
[im 28/38]
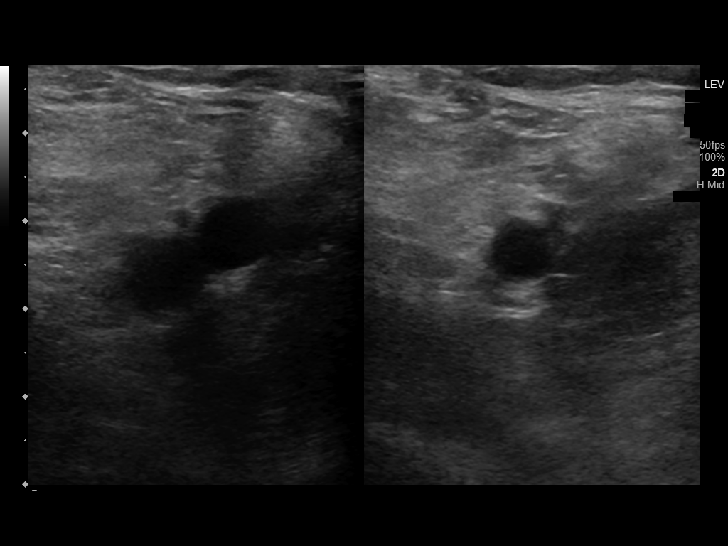
[im 31/38]
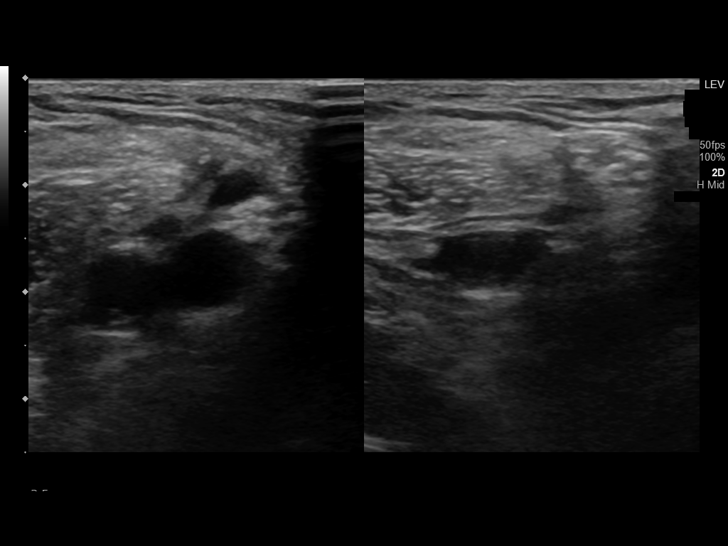
[im 34/38]
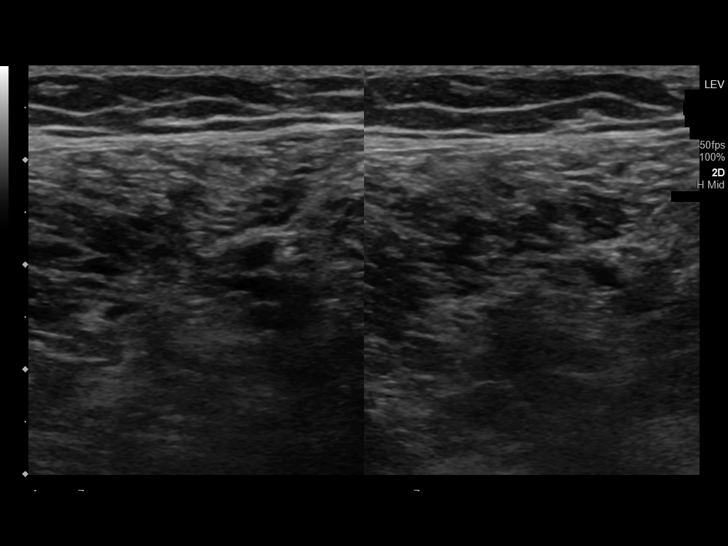
[im 38/38]
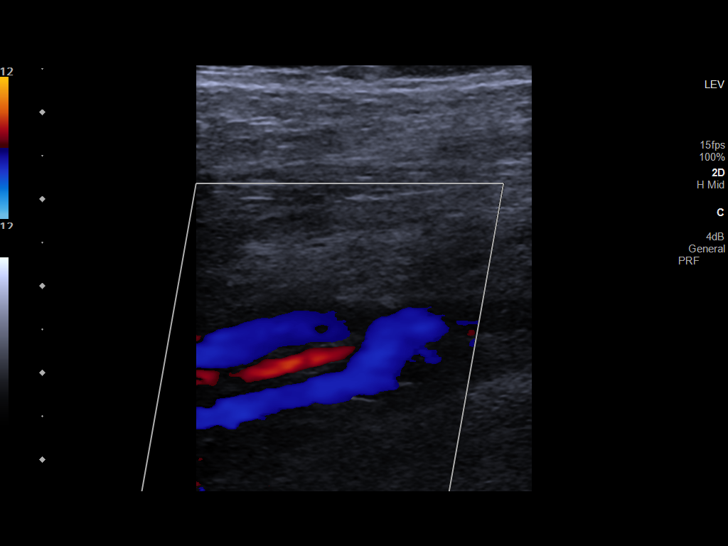

[13 of 24 positions shown; findings below may reference images not displayed]

FINDINGS: Contralateral Common Femoral Vein: Respiratory phasicity is normal
and symmetric with the symptomatic side. No evidence of thrombus.
Normal compressibility.

Common Femoral Vein: No evidence of thrombus. Normal
compressibility, respiratory phasicity and response to augmentation.

Saphenofemoral Junction: No evidence of thrombus. Normal
compressibility and flow on color Doppler imaging.

Profunda Femoral Vein: No evidence of thrombus. Normal
compressibility and flow on color Doppler imaging.

Femoral Vein: No evidence of thrombus. Normal compressibility,
respiratory phasicity and response to augmentation.

Popliteal Vein: No evidence of thrombus. Normal compressibility,
respiratory phasicity and response to augmentation.

Calf Veins: No evidence of thrombus. Normal compressibility and flow
on color Doppler imaging.

Superficial Great Saphenous Vein: No evidence of thrombus. Normal
compressibility.

Venous Reflux:  None.

Other Findings:  None.
IMPRESSION: No evidence of DVT within the left lower extremity.

## 2022-10-07 ENCOUNTER — Ambulatory Visit: Payer: Medicare Other | Attending: Registered Nurse | Admitting: Physical Therapy

## 2022-10-18 ENCOUNTER — Ambulatory Visit
Admission: RE | Admit: 2022-10-18 | Discharge: 2022-10-18 | Disposition: A | Discharge status: Home / Self Care | Payer: Medicare Other | Source: Ambulatory Visit | Attending: Neurology | Admitting: Neurology

## 2022-10-18 DIAGNOSIS — G3184 Mild cognitive impairment, so stated: Secondary | ICD-10-CM

## 2022-11-11 ENCOUNTER — Ambulatory Visit: Payer: Medicare Other | Admitting: Physical Therapy

## 2022-11-11 ENCOUNTER — Telehealth: Payer: Self-pay | Admitting: Physical Therapy

## 2022-11-11 NOTE — Telephone Encounter (Signed)
Physical therapist called pt re: missed appt today and her last appt.   Left message that there is no charge to missed appts but there is a waiting list with other patients needing treatment at our clinic.    Remaining appts are removed after 2 no-show appts per rehab department policy.  Provided encouragement to keep up with HEP.

## 2022-11-18 ENCOUNTER — Ambulatory Visit: Payer: Medicare Other | Admitting: Physical Therapy

## 2022-11-25 ENCOUNTER — Encounter: Payer: Medicare Other | Admitting: Physical Therapy

## 2022-12-02 ENCOUNTER — Encounter: Payer: Medicare Other | Admitting: Physical Therapy

## 2022-12-02 NOTE — Telephone Encounter (Signed)
Scheduled with Dr. Rueben Bash on 9/30 and 10/16

## 2022-12-08 ENCOUNTER — Other Ambulatory Visit: Payer: Self-pay | Admitting: Family Medicine

## 2022-12-08 DIAGNOSIS — Z1231 Encounter for screening mammogram for malignant neoplasm of breast: Secondary | ICD-10-CM

## 2022-12-09 ENCOUNTER — Encounter: Payer: Medicare Other | Admitting: Physical Therapy

## 2022-12-16 ENCOUNTER — Encounter: Payer: Medicare Other | Admitting: Physical Therapy

## 2022-12-17 ENCOUNTER — Ambulatory Visit
Admission: RE | Admit: 2022-12-17 | Discharge: 2022-12-17 | Disposition: A | Discharge status: Home / Self Care | Payer: Medicare Other | Source: Ambulatory Visit | Attending: Family Medicine | Admitting: Family Medicine

## 2022-12-17 DIAGNOSIS — Z1231 Encounter for screening mammogram for malignant neoplasm of breast: Secondary | ICD-10-CM | POA: Insufficient documentation

## 2022-12-20 ENCOUNTER — Telehealth: Payer: Self-pay | Admitting: Neurology

## 2022-12-20 NOTE — Telephone Encounter (Signed)
Have not seen the note from Dr. Rueben Bash yet.

## 2022-12-20 NOTE — Telephone Encounter (Signed)
Pt's husband, Loraine Leriche Schimpf: was seen at [facility] by Dr. Alfredia Ferguson. She recommended we schedule an appt as soon as possbile with Dr. Teresa Coombs to discuss medication, and next step. Would like a call back.

## 2022-12-22 ENCOUNTER — Encounter: Payer: Self-pay | Admitting: Neurology

## 2022-12-22 ENCOUNTER — Telehealth: Payer: Self-pay

## 2022-12-22 NOTE — Telephone Encounter (Signed)
Call to patient and she is in agreement for 10/24 visit at 3:45 to discuss aricept

## 2022-12-23 ENCOUNTER — Ambulatory Visit (INDEPENDENT_AMBULATORY_CARE_PROVIDER_SITE_OTHER): Payer: Medicare Other | Admitting: Neurology

## 2022-12-23 ENCOUNTER — Encounter: Payer: Self-pay | Admitting: Neurology

## 2022-12-23 ENCOUNTER — Encounter: Payer: Medicare Other | Admitting: Physical Therapy

## 2022-12-23 VITALS — BP 124/70 | Ht 60.0 in | Wt 120.0 lb

## 2022-12-23 DIAGNOSIS — G3184 Mild cognitive impairment, so stated: Secondary | ICD-10-CM

## 2022-12-23 DIAGNOSIS — G309 Alzheimer's disease, unspecified: Secondary | ICD-10-CM
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
## 2022-12-25 ENCOUNTER — Encounter: Payer: Self-pay | Admitting: Neurology

## 2022-12-25 NOTE — Patient Instructions (Addendum)
APO E4 genotype Amyloid PET scan I will contact you to go over the result and we will proceed with infusion Additional reading material regarding Leqembi given to patient and husband Follow-up in 6 months or sooner if worse

## 2022-12-27 ENCOUNTER — Telehealth: Payer: Self-pay | Admitting: Neurology

## 2022-12-27 NOTE — Telephone Encounter (Signed)
medicare/AAPR no auth required sent to [facility] [phone]   Can you please fix the order to say internal at [facility]? Thank you

## 2022-12-28 ENCOUNTER — Other Ambulatory Visit: Payer: Self-pay | Admitting: Neurology

## 2022-12-28 DIAGNOSIS — G309 Alzheimer's disease, unspecified: Secondary | ICD-10-CM

## 2022-12-28 NOTE — Telephone Encounter (Signed)
Done

## 2022-12-30 ENCOUNTER — Encounter: Payer: Medicare Other | Admitting: Physical Therapy

## 2023-01-03 LAB — APOE ALZHEIMER'S RISK

## 2023-01-14 ENCOUNTER — Encounter (HOSPITAL_COMMUNITY)
Admission: RE | Admit: 2023-01-14 | Discharge: 2023-01-14 | Disposition: A | Discharge status: Home / Self Care | Payer: Medicare Other | Source: Ambulatory Visit | Attending: Neurology | Admitting: Neurology

## 2023-01-14 DIAGNOSIS — G3184 Mild cognitive impairment, so stated: Secondary | ICD-10-CM | POA: Diagnosis present

## 2023-01-14 DIAGNOSIS — G309 Alzheimer's disease, unspecified: Secondary | ICD-10-CM | POA: Diagnosis present

## 2023-01-14 MED ORDER — FLORBETAPIR F 18 500-1900 MBQ/ML IV SOLN
10.1000 | Freq: Once | INTRAVENOUS | Status: AC
  Administered 2023-01-14: 10.1 via INTRAVENOUS

## 2023-01-21 ENCOUNTER — Other Ambulatory Visit: Payer: Self-pay | Admitting: Neurology

## 2023-01-21 MED ORDER — DONEPEZIL HCL 5 MG PO TABS: 5.0000 mg | ORAL_TABLET | Freq: Every day | ORAL | 0 refills | Status: DC

## 2023-02-13 ENCOUNTER — Other Ambulatory Visit: Payer: Self-pay | Admitting: Neurology

## 2023-02-21 ENCOUNTER — Ambulatory Visit: Payer: Medicare Other | Admitting: Dermatology

## 2023-02-28 ENCOUNTER — Ambulatory Visit: Payer: Medicare Other | Admitting: Dermatology

## 2023-03-09 ENCOUNTER — Ambulatory Visit: Payer: Medicare Other | Admitting: Dermatology

## 2023-03-09 DIAGNOSIS — D485 Neoplasm of uncertain behavior of skin: Secondary | ICD-10-CM

## 2023-03-09 DIAGNOSIS — Z86007 Personal history of in-situ neoplasm of skin: Secondary | ICD-10-CM

## 2023-03-09 DIAGNOSIS — L814 Other melanin hyperpigmentation: Secondary | ICD-10-CM

## 2023-03-09 DIAGNOSIS — Z7189 Other specified counseling: Secondary | ICD-10-CM

## 2023-03-09 DIAGNOSIS — D492 Neoplasm of unspecified behavior of bone, soft tissue, and skin: Secondary | ICD-10-CM | POA: Diagnosis not present

## 2023-03-09 DIAGNOSIS — W908XXA Exposure to other nonionizing radiation, initial encounter: Secondary | ICD-10-CM

## 2023-03-09 DIAGNOSIS — L570 Actinic keratosis: Secondary | ICD-10-CM | POA: Diagnosis not present

## 2023-03-09 DIAGNOSIS — L578 Other skin changes due to chronic exposure to nonionizing radiation: Secondary | ICD-10-CM | POA: Diagnosis not present

## 2023-03-09 DIAGNOSIS — L719 Rosacea, unspecified: Secondary | ICD-10-CM

## 2023-03-09 DIAGNOSIS — C44722 Squamous cell carcinoma of skin of right lower limb, including hip: Secondary | ICD-10-CM | POA: Diagnosis not present

## 2023-03-09 DIAGNOSIS — C4492 Squamous cell carcinoma of skin, unspecified: Secondary | ICD-10-CM

## 2023-03-09 DIAGNOSIS — D692 Other nonthrombocytopenic purpura: Secondary | ICD-10-CM

## 2023-03-09 HISTORY — DX: Squamous cell carcinoma of skin, unspecified: C44.92

## 2023-03-09 NOTE — Progress Notes (Signed)
 Follow-Up Visit   Subjective  Meghan Myers is a 73 y.o. female who presents for the following: Actinic keratosis follow up of the face and legs. PDT treatment x 2 04/2022 and 05/2022. She has a spot to recheck on the right lower leg. The patient has spots, moles and lesions to be evaluated, some may be new or changing. 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 She has pinkness on her nose, not improved with metronidazole  0.75% cream.    The following portions of the chart were reviewed this encounter and updated as appropriate: medications, allergies, medical history  Review of Systems:  No other skin or systemic complaints except as noted in HPI or Assessment and Plan.  Objective  Well appearing patient in no apparent distress; mood and affect are within normal limits.  A focused examination was performed of the following areas: Face, legs  Relevant exam findings are noted in the Assessment and Plan.  upper nasal dorsum x 1, L wrist x 2, L central lower lip x 1 (4) Pink scaly macules. right lateral pretibia 1.0 cm pink scaly papule    Assessment & Plan   AK (ACTINIC KERATOSIS) (4) upper nasal dorsum x 1, L wrist x 2, L central lower lip x 1 (4) Recheck AK lip on f/up  Actinic keratoses are precancerous spots that appear secondary to cumulative UV radiation exposure/sun exposure over time. They are chronic with expected duration over 1 year. A portion of actinic keratoses will progress to squamous cell carcinoma of the skin. It is not possible to reliably predict which spots will progress to skin cancer and so treatment is recommended to prevent development of skin cancer.  Recommend daily broad spectrum sunscreen SPF 30+ to sun-exposed areas, reapply every 2 hours as needed.  Recommend staying in the shade or wearing long sleeves, sun glasses (UVA+UVB protection) and wide brim hats (4-inch brim around the entire circumference of the hat). Call for new or changing  lesions. Destruction of lesion - upper nasal dorsum x 1, L wrist x 2, L central lower lip x 1 (4)  Destruction method: cryotherapy   Informed consent: discussed and consent obtained   Lesion destroyed using liquid nitrogen: Yes   Region frozen until ice ball extended beyond lesion: Yes   Outcome: patient tolerated procedure well with no complications   Post-procedure details: wound care instructions given   Additional details:  Prior to procedure, discussed risks of blister formation, small wound, skin dyspigmentation, or rare scar following cryotherapy. Recommend Vaseline ointment to treated areas while healing.  NEOPLASM OF UNCERTAIN BEHAVIOR OF SKIN right lateral pretibia Epidermal / dermal shaving  Lesion diameter (cm):  1.1 Informed consent: discussed and consent obtained   Patient was prepped and draped in usual sterile fashion: Area prepped with alcohol. Anesthesia: the lesion was anesthetized in a standard fashion   Anesthetic:  1% lidocaine  w/ epinephrine  1-100,000 buffered w/ 8.4% NaHCO3 Instrument used: flexible razor blade   Hemostasis achieved with: pressure, aluminum chloride and electrodesiccation   Outcome: patient tolerated procedure well    Destruction of lesion  Destruction method: electrodesiccation and curettage   Informed consent: discussed and consent obtained   Curettage performed in three different directions: Yes   Electrodesiccation performed over the curetted area: Yes   Final wound size (cm):  1.1 Hemostasis achieved with:  pressure, aluminum chloride and electrodesiccation Outcome: patient tolerated procedure well with no complications   Post-procedure details: wound care instructions given  Post-procedure details comment:  Ointment and bandage applied. Specimen 1 - Surgical pathology Differential Diagnosis: Inflamed SK vs Hypertrophic AK r/o SCC Check Margins: No 1.0 cm pink scaly papule. EDC today  ACTINIC DAMAGE - chronic, secondary to  cumulative UV radiation exposure/sun exposure over time - diffuse scaly erythematous macules with underlying dyspigmentation - Recommend daily broad spectrum sunscreen SPF 30+ to sun-exposed areas, reapply every 2 hours as needed.  - Recommend staying in the shade or wearing long sleeves, sun glasses (UVA+UVB protection) and wide brim hats (4-inch brim around the entire circumference of the hat). - Call for new or changing lesions.   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 Condition is symptomatic/ bothersome to patient. Not currently at goal.   Rosacea is a chronic progressive skin condition usually affecting the face of adults, causing redness and/or acne bumps. It is treatable but not curable. It sometimes affects the eyes (ocular rosacea) as well. It may respond to topical and/or systemic medication and can flare with stress, sun exposure, alcohol, exercise, topical steroids (including hydrocortisone/cortisone 10) and some foods.  Daily application of broad spectrum spf 30+ sunscreen to face is recommended to reduce flares.  Patient denies grittiness of the eyes  Treatment Plan Defers refill of metronidazole  0.75% cream, not helping. Recommend BBL treatment   Counseling for BBL / IPL / Laser and Coordination of Care Discussed the treatment option of Broad Band Light (BBL) /Intense Pulsed Light (IPL)/ Laser for skin discoloration, including brown spots and redness.  Typically we recommend at least 1-3 treatment sessions about 5-8 weeks apart for best  results.  Cannot have tanned skin when BBL performed, and regular use of sunscreen/photoprotection is advised after the procedure to help maintain results. The patient's condition may also require maintenance treatments in the future.  The fee for BBL / laser treatments is $350 per treatment session for the whole face.  A fee can be quoted for other parts of the body.  Insurance typically does not pay for BBL/laser treatments and therefore the fee is an out-of-pocket cost. Recommend prophylactic valtrex treatment. Once scheduled for procedure, will send Rx in prior to patient's appointment.   LENTIGINES Exam: scattered tan macules Due to sun exposure Treatment Plan: Benign-appearing, observe. Recommend daily broad spectrum sunscreen SPF 30+ to sun-exposed areas, reapply every 2 hours as needed.  Call for any changes  Purpura - Chronic; persistent and recurrent.  Treatable, but not curable. - Violaceous macules and patches - Benign - Related to trauma, age, sun damage and/or use of blood thinners, chronic use of topical and/or oral steroids - Observe - Can use OTC arnica containing moisturizer such as Dermend Bruise Formula if desired - Call for worsening or other concerns   Return in about 6 months (around 09/06/2023) for TBSE, Hx SCC.  IAndrea Kerns, CMA, am acting as scribe for Rexene Rattler, MD .   Documentation: I have reviewed the above documentation for accuracy and completeness, and I agree with the above.  Rexene Rattler, MD

## 2023-03-09 NOTE — Patient Instructions (Addendum)
 Cryotherapy Aftercare  Wash gently with soap and water everyday.   Apply Vaseline and Band-Aid daily until healed.   Wound Care Instructions  Cleanse wound gently with soap and water once a day then pat dry with clean gauze. Apply a thin coat of Petrolatum (petroleum jelly, Vaseline) over the wound (unless you have an allergy to this). We recommend that you use a new, sterile tube of Vaseline. Do not pick or remove scabs. Do not remove the yellow or white healing tissue from the base of the wound.  Cover the wound with fresh, clean, nonstick gauze and secure with paper tape. You may use Band-Aids in place of gauze and tape if the wound is small enough, but would recommend trimming much of the tape off as there is often too much. Sometimes Band-Aids can irritate the skin.  You should call the office for your biopsy report after 1 week if you have not already been contacted.  If you experience any problems, such as abnormal amounts of bleeding, swelling, significant bruising, significant pain, or evidence of infection, please call the office immediately.  FOR ADULT SURGERY PATIENTS: If you need something for pain relief you may take 1 extra strength Tylenol  (acetaminophen ) AND 2 Ibuprofen (200mg  each) together every 4 hours as needed for pain. (do not take these if you are allergic to them or if you have a reason you should not take them.) Typically, you may only need pain medication for 1 to 3 days.     Counseling for BBL / IPL / Laser and Coordination of Care Discussed the treatment option of Broad Band Light (BBL) /Intense Pulsed Light (IPL)/ Laser for skin discoloration, including brown spots and redness.  Typically we recommend at least 1-3 treatment sessions about 5-8 weeks apart for best results.  Cannot have tanned skin when BBL performed, and regular use of sunscreen/photoprotection is advised after the procedure to help maintain results. The patient's condition may also require  maintenance treatments in the future.  The fee for BBL / laser treatments is $350 per treatment session for the whole face.  A fee can be quoted for other parts of the body.  Insurance typically does not pay for BBL/laser treatments and therefore the fee is an out-of-pocket cost. Recommend prophylactic valtrex treatment. Once scheduled for procedure, will send Rx in prior to patient's appointment.    Due to recent changes in healthcare laws, you may see results of your pathology and/or laboratory studies on MyChart before the doctors have had a chance to review them. We understand that in some cases there may be results that are confusing or concerning to you. Please understand that not all results are received at the same time and often the doctors may need to interpret multiple results in order to provide you with the best plan of care or course of treatment. Therefore, we ask that you please give us  2 business days to thoroughly review all your results before contacting the office for clarification. Should we see a critical lab result, you will be contacted sooner.   If You Need Anything After Your Visit  If you have any questions or concerns for your doctor, please call our main line at [phone] and press option 4 to reach your doctor's medical assistant. If no one answers, please leave a voicemail as directed and we will return your call as soon as possible. Messages left after 4 pm will be answered the following business day.   You may also send  us  a message via MyChart. We typically respond to MyChart messages within 1-2 business days.  For prescription refills, please ask your pharmacy to contact our office. Our fax number is [phone].  If you have an urgent issue when the clinic is closed that cannot wait until the next business day, you can page your doctor at the number below.    Please note that while we do our best to be available for urgent issues outside of office hours, we  are not available 24/7.   If you have an urgent issue and are unable to reach us , you may choose to seek medical care at your doctor's office, retail clinic, urgent care center, or emergency room.  If you have a medical emergency, please immediately call 911 or go to the emergency department.  Pager Numbers  - Dr. Hester: [phone]  - Dr. Jackquline: [phone]  - Dr. Claudene: [phone]   In the event of inclement weather, please call our main line at [phone] for an update on the status of any delays or closures.  Dermatology Medication Tips: Please keep the boxes that topical medications come in in order to help keep track of the instructions about where and how to use these. Pharmacies typically print the medication instructions only on the boxes and not directly on the medication tubes.   If your medication is too expensive, please contact our office at [phone] option 4 or send us  a message through MyChart.   We are unable to tell what your co-pay for medications will be in advance as this is different depending on your insurance coverage. However, we may be able to find a substitute medication at lower cost or fill out paperwork to get insurance to cover a needed medication.   If a prior authorization is required to get your medication covered by your insurance company, please allow us  1-2 business days to complete this process.  Drug prices often vary depending on where the prescription is filled and some pharmacies may offer cheaper prices.  The website www.goodrx.com contains coupons for medications through different pharmacies. The prices here do not account for what the cost may be with help from insurance (it may be cheaper with your insurance), but the website can give you the price if you did not use any insurance.  - You can print the associated coupon and take it with your prescription to the pharmacy.  - You may also stop by our office during regular  business hours and pick up a GoodRx coupon card.  - If you need your prescription sent electronically to a different pharmacy, notify our office through [facility] or by phone at [phone] option 4.     Si Usted Necesita Algo Despus de Su Visita  Tambin puede enviarnos un mensaje a travs de Clinical Cytogeneticist. Por lo general respondemos a los mensajes de MyChart en el transcurso de 1 a 2 das hbiles.  Para renovar recetas, por favor pida a su farmacia que se ponga en contacto con nuestra oficina. Randi lakes de fax es [place] [phone].  Si tiene un asunto urgente cuando la clnica est cerrada y que no puede esperar hasta el siguiente da hbil, puede llamar/localizar a su doctor(a) al nmero que aparece a continuacin.   Por favor, tenga en cuenta que aunque hacemos todo lo posible para estar disponibles para asuntos urgentes fuera del horario de [place], no estamos disponibles las 24 horas del da, los 7 [address]  [address] de la [place].   Si tiene  un problema urgente y no puede comunicarse con nosotros, puede optar por buscar atencin mdica  en el consultorio de su doctor(a), en una clnica privada, en un centro de atencin urgente o en una sala de emergencias.  Si tiene engineer, drilling, por favor llame inmediatamente al 911 o vaya a la sala de emergencias.  Nmeros de bper  - Dr. Hester: [phone]  - Dra. Jackquline: [phone]  - Dr. Claudene: [phone]   En caso de inclemencias del tiempo, por favor llame a landry capes principal al [phone] para una actualizacin sobre el [place] de cualquier retraso o cierre.  Consejos para la medicacin en dermatologa: Por favor, guarde las cajas en las que vienen los medicamentos de uso tpico para ayudarle a seguir las instrucciones sobre dnde y cmo usarlos. Las farmacias generalmente imprimen las instrucciones del medicamento slo en las cajas y no directamente en los tubos del [place].   Si su medicamento es muy caro, por  favor, pngase en contacto con landry rieger llamando al [phone] y presione la opcin 4 o envenos un mensaje a travs de Clinical Cytogeneticist.   No podemos decirle cul ser su copago por los medicamentos por adelantado ya que esto es diferente dependiendo de la cobertura de su seguro. Sin embargo, es posible que podamos encontrar un medicamento sustituto a audiological scientist un formulario para que el seguro cubra el medicamento que se considera necesario.   Si se requiere una autorizacin previa para que su compaa de seguros cubra su medicamento, por favor permtanos de 1 a 2 das hbiles para completar este proceso.  Los precios de los medicamentos varan con frecuencia dependiendo del environmental consultant de dnde se surte la receta y alguna farmacias pueden ofrecer precios ms baratos.  El sitio web www.goodrx.com tiene cupones para medicamentos de health and safety inspector. Los precios aqu no tienen en cuenta lo que podra costar con la ayuda del seguro (puede ser ms barato con su seguro), pero el sitio web puede darle el precio si no utiliz tourist information centre manager.  - Puede imprimir el cupn correspondiente y llevarlo con su receta a la farmacia.  - Tambin puede pasar por nuestra oficina durante el horario de atencin regular y education officer, museum una tarjeta de cupones de GoodRx.  - Si necesita que su receta se enve electrnicamente a una farmacia diferente, informe a nuestra oficina a travs de MyChart de [place] o por telfono llamando al [phone] y presione la opcin 4.

## 2023-03-10 LAB — SURGICAL PATHOLOGY

## 2023-03-14 ENCOUNTER — Telehealth: Payer: Self-pay

## 2023-03-14 NOTE — Telephone Encounter (Signed)
-----   Message from Willeen Niece sent at 03/14/2023 10:52 AM EST ----- 1. Skin, right lateral pretibia :       WELL DIFFERENTIATED SQUAMOUS CELL CARCINOMA   SCC skin cancer- already treated with EDC at time of biopsy   - please call patient

## 2023-03-14 NOTE — Telephone Encounter (Signed)
 Left pt msg to call for bx results/sh

## 2023-03-15 ENCOUNTER — Telehealth: Payer: Self-pay

## 2023-03-15 NOTE — Telephone Encounter (Signed)
 Discussed biopsy results with patient

## 2023-03-15 NOTE — Telephone Encounter (Signed)
-----   Message from Willeen Niece sent at 03/14/2023 10:52 AM EST ----- 1. Skin, right lateral pretibia :       WELL DIFFERENTIATED SQUAMOUS CELL CARCINOMA   SCC skin cancer- already treated with EDC at time of biopsy   - please call patient

## 2023-04-02 ENCOUNTER — Emergency Department
Admission: EM | Admit: 2023-04-02 | Discharge: 2023-04-02 | Disposition: A | Discharge status: Home / Self Care | Payer: Medicare Other | Attending: Emergency Medicine | Admitting: Emergency Medicine

## 2023-04-02 ENCOUNTER — Emergency Department: Payer: Medicare Other

## 2023-04-02 ENCOUNTER — Other Ambulatory Visit: Payer: Self-pay

## 2023-04-02 DIAGNOSIS — I10 Essential (primary) hypertension: Secondary | ICD-10-CM | POA: Diagnosis not present

## 2023-04-02 DIAGNOSIS — R059 Cough, unspecified: Secondary | ICD-10-CM | POA: Diagnosis present

## 2023-04-02 DIAGNOSIS — R1310 Dysphagia, unspecified: Secondary | ICD-10-CM | POA: Insufficient documentation

## 2023-04-02 DIAGNOSIS — U071 COVID-19: Secondary | ICD-10-CM | POA: Insufficient documentation

## 2023-04-02 DIAGNOSIS — J069 Acute upper respiratory infection, unspecified: Secondary | ICD-10-CM | POA: Diagnosis not present

## 2023-04-02 LAB — CBC WITH DIFFERENTIAL/PLATELET
Abs Immature Granulocytes: 0.02 10*3/uL (ref 0.00–0.07)
Basophils Absolute: 0 10*3/uL (ref 0.0–0.1)
Basophils Relative: 0 %
Eosinophils Absolute: 0 10*3/uL (ref 0.0–0.5)
Eosinophils Relative: 0 %
HCT: 41.6 % (ref 36.0–46.0)
Hemoglobin: 14.1 g/dL (ref 12.0–15.0)
Immature Granulocytes: 0 %
Lymphocytes Relative: 15 %
Lymphs Abs: 0.8 10*3/uL (ref 0.7–4.0)
MCH: 30.4 pg (ref 26.0–34.0)
MCHC: 33.9 g/dL (ref 30.0–36.0)
MCV: 89.7 fL (ref 80.0–100.0)
Monocytes Absolute: 0.6 10*3/uL (ref 0.1–1.0)
Monocytes Relative: 12 %
Neutro Abs: 3.9 10*3/uL (ref 1.7–7.7)
Neutrophils Relative %: 73 %
Platelets: 202 10*3/uL (ref 150–400)
RBC: 4.64 MIL/uL (ref 3.87–5.11)
RDW: 12.6 % (ref 11.5–15.5)
WBC: 5.4 10*3/uL (ref 4.0–10.5)
nRBC: 0 % (ref 0.0–0.2)

## 2023-04-02 LAB — BASIC METABOLIC PANEL
Anion gap: 11 (ref 5–15)
BUN: 18 mg/dL (ref 8–23)
CO2: 26 mmol/L (ref 22–32)
Calcium: 9 mg/dL (ref 8.9–10.3)
Chloride: 98 mmol/L (ref 98–111)
Creatinine, Ser: 0.76 mg/dL (ref 0.44–1.00)
GFR, Estimated: >60 mL/min (ref >60)
Glucose, Bld: 128 mg/dL — ABNORMAL HIGH (ref 70–99)
Potassium: 3.6 mmol/L (ref 3.5–5.1)
Sodium: 135 mmol/L (ref 135–145)

## 2023-04-02 LAB — RESP PANEL BY RT-PCR (RSV, FLU A&B, COVID)  RVPGX2
Influenza A by PCR: NEGATIVE
Influenza B by PCR: NEGATIVE
Resp Syncytial Virus by PCR: NEGATIVE
SARS Coronavirus 2 by RT PCR: POSITIVE — AB

## 2023-04-02 LAB — GROUP A STREP BY PCR: Group A Strep by PCR: NOT DETECTED

## 2023-04-02 MED ORDER — LIDOCAINE VISCOUS HCL 2 % MT SOLN: 15.0000 mL | OROMUCOSAL | 0 refills | Status: DC | PRN

## 2023-04-02 MED ORDER — LIDOCAINE VISCOUS HCL 2 % MT SOLN
15.0000 mL | Freq: Once | OROMUCOSAL | Status: AC
  Administered 2023-04-02: 15 mL via OROMUCOSAL
  Filled 2023-04-02: qty 15

## 2023-04-02 MED ORDER — KETOROLAC TROMETHAMINE 30 MG/ML IJ SOLN
15.0000 mg | Freq: Once | INTRAMUSCULAR | Status: AC
Start: 2023-04-02 — End: 2023-04-02
  Administered 2023-04-02: 15 mg via INTRAMUSCULAR
  Filled 2023-04-02: qty 1

## 2023-04-02 MED ORDER — ACETAMINOPHEN 500 MG PO TABS
1000.0000 mg | ORAL_TABLET | Freq: Once | ORAL | Status: AC
  Administered 2023-04-02: 1000 mg via ORAL
  Filled 2023-04-02: qty 2

## 2023-04-02 NOTE — ED Provider Notes (Signed)
[facility] Provider Note    Event Date/Time   First MD Initiated Contact with Patient 04/02/23 0732     (approximate)   History   Sore Throat, Nasal Congestion, and Cough   HPI  Meghan Myers is a 73 y.o. female who presents to the ED for evaluation of Sore Throat, Nasal Congestion, and Cough   Review of [facility] urology visit from 2 months ago.  History of RCC s/p left partial nephrectomy.  Otherwise history of HTN, HLD  Patient presents with her husband for evaluation of a severe sore throat, odynophagia and malaise diffusely.  Symptoms for 2-3 days.  Difficulty keeping fluids down due to odynophagia.   Physical Exam   Triage Vital Signs: ED Triage Vitals  Encounter Vitals Group     BP 04/02/23 0509 (!) 140/63     Systolic BP Percentile --      Diastolic BP Percentile --      Pulse Rate 04/02/23 0509 93     Resp 04/02/23 0515 20     Temp 04/02/23 0509 98.6 F (37 C)     Temp Source 04/02/23 0509 Oral     SpO2 04/02/23 0509 97 %     Weight 04/02/23 0512 118 lb (53.5 kg)     Height 04/02/23 0512 5\' 1"  (1.549 m)     Head Circumference --      Peak Flow --      Pain Score --      Pain Loc --      Pain Education --      Exclude from Growth Chart --     Most recent vital signs: Vitals:   04/02/23 0730 04/02/23 0900  BP: (!) 172/80 130/63  Pulse: 83 78  Resp: 20 18  Temp:    SpO2: 100% 99%    General: Awake, no distress.  CV:  Good peripheral perfusion.  Resp:  Normal effort.  Abd:  No distention.  Soft and benign MSK:  No deformity noted.  Neuro:  No focal deficits appreciated. Other:  Erythematous posterior oropharynx without any localized swelling, purulence or signs of PTA.  Uvula is at midline.   ED Results / Procedures / Treatments   Labs (all labs ordered are listed, but only abnormal results are displayed) Labs Reviewed  RESP PANEL BY RT-PCR (RSV, FLU A&B, COVID)  RVPGX2 - Abnormal; Notable for the following components:       Result Value   SARS Coronavirus 2 by RT PCR POSITIVE (*)    All other components within normal limits  BASIC METABOLIC PANEL - Abnormal; Notable for the following components:   Glucose, Bld 128 (*)    All other components within normal limits  GROUP A STREP BY PCR  CBC WITH DIFFERENTIAL/PLATELET    EKG Sinus rhythm with a rate of 92 bpm.  Normal axis and intervals.  No signs of acute ischemia.  RADIOLOGY CXR interpreted by me without evidence of acute cardiopulmonary pathology.  Official radiology report(s): DG Chest 2 View Result Date: 04/02/2023 CLINICAL DATA:  73 year old female with history of cough and sore throat. EXAM: CHEST - 2 VIEW COMPARISON:  Chest x-ray 01/31/2018. FINDINGS: Lung volumes are normal. No consolidative airspace disease. No pleural effusions. No pneumothorax. No pulmonary nodule or mass noted. Pulmonary vasculature and the cardiomediastinal silhouette are within normal limits. IMPRESSION: No radiographic evidence of acute cardiopulmonary disease. Electronically Signed   By: Trudie Reed M.D.   On: 04/02/2023 06:04  PROCEDURES and INTERVENTIONS:  .1-3 Lead EKG Interpretation  Performed by: Delton Prairie, MD Authorized by: Delton Prairie, MD     Interpretation: normal     ECG rate:  80   ECG rate assessment: normal     Rhythm: sinus rhythm     Ectopy: none     Conduction: normal     Medications  acetaminophen (TYLENOL) tablet 1,000 mg (1,000 mg Oral Given 04/02/23 0810)  lidocaine (XYLOCAINE) 2 % viscous mouth solution 15 mL (15 mLs Mouth/Throat Given 04/02/23 0811)  ketorolac (TORADOL) 30 MG/ML injection 15 mg (15 mg Intramuscular Given 04/02/23 0812)     IMPRESSION / MDM / ASSESSMENT AND PLAN / ED COURSE  I reviewed the triage vital signs and the nursing notes.  Differential diagnosis includes, but is not limited to, viral syndrome, strep throat, sepsis or PTA, dehydration, AKI  {Patient presents with symptoms of an acute illness or injury that is  potentially life-threatening.  Patient presents with a couple days of sore throat and odynophagia, likely due to COVID-19.  No signs of PTA, testing negative for strep throat and no indications for antibiotic deployment.  Normal CBC and metabolic panel.  Clear CXR.  We will provide symptomatic measures, p.o. challenge, ambulation trial and reassess.  Hopefully suitable for outpatient management.  Clinical Course as of 04/02/23 1119  Sat Apr 02, 2023  0909 Reassessed.  Fast asleep.  Husband reports that she was indicating she felt better before falling asleep.  He is eager to go home./We will wake her up, ambulate and hopefully discharge [DS]    Clinical Course User Index [DS] Delton Prairie, MD     FINAL CLINICAL IMPRESSION(S) / ED DIAGNOSES   Final diagnoses:  Viral URI with cough  COVID-19  Odynophagia     Rx / DC Orders   ED Discharge Orders          Ordered    lidocaine (XYLOCAINE) 2 % solution  As needed        04/02/23 0801             Note:  This document was prepared using Dragon voice recognition software and may include unintentional dictation errors.   Delton Prairie, MD 04/02/23 [phone]

## 2023-04-02 NOTE — ED Notes (Signed)
Pt resting, RR even and unlabored 

## 2023-04-02 NOTE — Discharge Instructions (Addendum)
Use Tylenol for pain and fevers.  Up to 1000 mg per dose, up to 4 times per day.  Do not take more than 4000 mg of Tylenol/acetaminophen within 24 hours..  Use naproxen/Aleve for anti-inflammatory pain relief. Use up to 500mg  every 12 hours. Do not take more frequently than this. Do not use other NSAIDs (ibuprofen, Advil) while taking this medication. It is safe to take Tylenol with this.   Lidocaine solution as needed for severe sore throat. Up to 3-4 times per day.

## 2023-04-02 NOTE — ED Triage Notes (Signed)
Patient C/O sore throat, cough, and nasal congestion that started two days ago. Patient's husband was diagnosed with the flu last week.

## 2023-08-01 ENCOUNTER — Encounter: Payer: Self-pay | Admitting: *Deleted

## 2023-08-01 ENCOUNTER — Ambulatory Visit
Admission: EM | Admit: 2023-08-01 | Discharge: 2023-08-01 | Disposition: A | Discharge status: Home / Self Care | Attending: Emergency Medicine | Admitting: Emergency Medicine

## 2023-08-01 DIAGNOSIS — R21 Rash and other nonspecific skin eruption: Secondary | ICD-10-CM

## 2023-08-01 MED ORDER — PREDNISONE 10 MG (21) PO TBPK: ORAL_TABLET | Freq: Every day | ORAL | 0 refills | Status: AC
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
## 2023-08-01 NOTE — Discharge Instructions (Signed)
 Today you are evaluated for your rash which appears inflammatory, at this time no signs of infection however with the open area on your arm please watch for any puslike drainage which would be concerning  Starting tomorrow take prednisone every morning with food as directed to reduce the inflammatory response and help to calm the rash, this will help to also reduce the swelling to your right arm  For itching May use oral Claritin Zyrtec or Benadryl , may use topical Benadryl , calamine lotion gradient over-the-counter anti-itch products  Please be mindful of exposure to heat as this may cause irritation to your skin, if this occurs cool skin down with a rag  May cleanse wound on the arm during normal hygiene with soap and water and cover with a nonstick bandage  For any persisting or worsening symptoms please follow-up for reevaluation

## 2023-08-01 NOTE — ED Triage Notes (Signed)
 Patient has rash to right arm, left chest and posterior neck, thinks she may have been bitten by something.

## 2023-08-16 ENCOUNTER — Ambulatory Visit (INDEPENDENT_AMBULATORY_CARE_PROVIDER_SITE_OTHER): Payer: Medicare Other | Admitting: Neurology

## 2023-08-16 ENCOUNTER — Encounter: Payer: Self-pay | Admitting: Neurology

## 2023-08-16 VITALS — BP 122/74 | Ht 60.0 in | Wt 125.0 lb

## 2023-08-16 DIAGNOSIS — G3184 Mild cognitive impairment, so stated: Secondary | ICD-10-CM

## 2023-08-16 DIAGNOSIS — G309 Alzheimer's disease, unspecified: Secondary | ICD-10-CM

## 2023-08-16 MED ORDER — RIVASTIGMINE TARTRATE 1.5 MG PO CAPS: 1.5000 mg | ORAL_CAPSULE | Freq: Two times a day (BID) | ORAL | 3 refills | Status: DC
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
## 2023-08-16 NOTE — Patient Instructions (Addendum)
 Discontinue Aricept  Start Exelon 1.5 mg twice daily, if able to tolerate will increase to 3 mg twice daily Continue your other medications Decrease alcohol consumption, 2 to 3 glasses of wine weekly Continue to maintain good health, good sleep pattern, good diet and good exercise plan Continue follow-up with follow-up PCP Return in 1 year or sooner if worse

## 2023-09-06 ENCOUNTER — Ambulatory Visit: Payer: Medicare Other | Admitting: Dermatology

## 2023-09-06 DIAGNOSIS — D1801 Hemangioma of skin and subcutaneous tissue: Secondary | ICD-10-CM

## 2023-09-06 DIAGNOSIS — W908XXA Exposure to other nonionizing radiation, initial encounter: Secondary | ICD-10-CM

## 2023-09-06 DIAGNOSIS — L821 Other seborrheic keratosis: Secondary | ICD-10-CM

## 2023-09-06 DIAGNOSIS — C44629 Squamous cell carcinoma of skin of left upper limb, including shoulder: Secondary | ICD-10-CM

## 2023-09-06 DIAGNOSIS — L578 Other skin changes due to chronic exposure to nonionizing radiation: Secondary | ICD-10-CM | POA: Diagnosis not present

## 2023-09-06 DIAGNOSIS — C4491 Basal cell carcinoma of skin, unspecified: Secondary | ICD-10-CM

## 2023-09-06 DIAGNOSIS — Z1283 Encounter for screening for malignant neoplasm of skin: Secondary | ICD-10-CM

## 2023-09-06 DIAGNOSIS — L814 Other melanin hyperpigmentation: Secondary | ICD-10-CM

## 2023-09-06 DIAGNOSIS — L565 Disseminated superficial actinic porokeratosis (DSAP): Secondary | ICD-10-CM

## 2023-09-06 DIAGNOSIS — Z86007 Personal history of in-situ neoplasm of skin: Secondary | ICD-10-CM

## 2023-09-06 DIAGNOSIS — C44519 Basal cell carcinoma of skin of other part of trunk: Secondary | ICD-10-CM | POA: Diagnosis not present

## 2023-09-06 DIAGNOSIS — D492 Neoplasm of unspecified behavior of bone, soft tissue, and skin: Secondary | ICD-10-CM

## 2023-09-06 DIAGNOSIS — L720 Epidermal cyst: Secondary | ICD-10-CM

## 2023-09-06 DIAGNOSIS — D229 Melanocytic nevi, unspecified: Secondary | ICD-10-CM

## 2023-09-06 DIAGNOSIS — L570 Actinic keratosis: Secondary | ICD-10-CM | POA: Diagnosis not present

## 2023-09-06 DIAGNOSIS — L719 Rosacea, unspecified: Secondary | ICD-10-CM

## 2023-09-06 DIAGNOSIS — D692 Other nonthrombocytopenic purpura: Secondary | ICD-10-CM

## 2023-09-06 DIAGNOSIS — Z85828 Personal history of other malignant neoplasm of skin: Secondary | ICD-10-CM

## 2023-09-06 HISTORY — DX: Basal cell carcinoma of skin, unspecified: C44.91

## 2023-09-06 NOTE — Patient Instructions (Addendum)
 Electrodesiccation and Curettage ("Scrape and Burn") Wound Care Instructions  Leave the original bandage on for 24 hours if possible.  If the bandage becomes soaked or soiled before that time, it is OK to remove it and examine the wound.  A small amount of post-operative bleeding is normal.  If excessive bleeding occurs, remove the bandage, place gauze over the site and apply continuous pressure (no peeking) over the area for 30 minutes. If this does not work, please call our clinic as soon as possible or page your doctor if it is after hours.   Once a day, cleanse the wound with soap and water. It is fine to shower. If a thick crust develops you may use a Q-tip dipped into dilute hydrogen peroxide (mix 1:1 with water) to dissolve it.  Hydrogen peroxide can slow the healing process, so use it only as needed.    After washing, apply petroleum jelly (Vaseline) or an antibiotic ointment if your doctor prescribed one for you, followed by a bandage.    For best healing, the wound should be covered with a layer of ointment at all times. If you are not able to keep the area covered with a bandage to hold the ointment in place, this may mean re-applying the ointment several times a day.  Continue this wound care until the wound has healed and is no longer open. It may take several weeks for the wound to heal and close.  Itching and mild discomfort is normal during the healing process.  If you have any discomfort, you can take Tylenol  (acetaminophen ) or ibuprofen as directed on the bottle. (Please do not take these if you have an allergy to them or cannot take them for another reason).  Some redness, tenderness and white or yellow material in the wound is normal healing.  If the area becomes very sore and red, or develops a thick yellow-green material (pus), it may be infected; please notify us .    Wound healing continues for up to one year following surgery. It is not unusual to experience pain in the scar  from time to time during the interval.  If the pain becomes severe or the scar thickens, you should notify the office.    A slight amount of redness in a scar is expected for the first six months.  After six months, the redness will fade and the scar will soften and fade.  The color difference becomes less noticeable with time.  If there are any problems, return for a post-op surgery check at your earliest convenience.  To improve the appearance of the scar, you can use silicone scar gel, cream, or sheets (such as Mederma or Serica) every night for up to one year. These are available over the counter (without a prescription).  Please call our office at [phone] for any questions or concerns.   Cryotherapy Aftercare  Wash gently with soap and water everyday.   Apply Vaseline and Band-Aid daily until healed.   For Rosacea: Counseling for BBL / IPL / Laser and Coordination of Care Discussed the treatment option of Broad Band Light (BBL) /Intense Pulsed Light (IPL)/ Laser for skin discoloration, including brown spots and redness.  Typically we recommend at least 1-3 treatment sessions about 5-8 weeks apart for best results.  Cannot have tanned skin when BBL performed, and regular use of sunscreen/photoprotection is advised after the procedure to help maintain results. The patient's condition may also require maintenance treatments in the future.  The fee for  BBL / laser treatments is $350 per treatment session for the whole face.  A fee can be quoted for other parts of the body.  Insurance typically does not pay for BBL/laser treatments and therefore the fee is an out-of-pocket cost. Recommend prophylactic valtrex treatment. Once scheduled for procedure, will send Rx in prior to patient's appointment.    Recommend daily broad spectrum sunscreen SPF 30+ to sun-exposed areas, reapply every 2 hours as needed. Call for new or changing lesions.  Staying in the shade or wearing long sleeves, sun  glasses (UVA+UVB protection) and wide brim hats (4-inch brim around the entire circumference of the hat) are also recommended for sun protection.    Melanoma ABCDEs  Melanoma is the most dangerous type of skin cancer, and is the leading cause of death from skin disease.  You are more likely to develop melanoma if you: Have light-colored skin, light-colored eyes, or red or blond hair Spend a lot of time in the sun Tan regularly, either outdoors or in a tanning bed Have had blistering sunburns, especially during childhood Have a close family member who has had a melanoma Have atypical moles or large birthmarks  Early detection of melanoma is key since treatment is typically straightforward and cure rates are extremely high if we catch it early.   The first sign of melanoma is often a change in a mole or a new dark spot.  The ABCDE system is a way of remembering the signs of melanoma.  A for asymmetry:  The two halves do not match. B for border:  The edges of the growth are irregular. C for color:  A mixture of colors are present instead of an even brown color. D for diameter:  Melanomas are usually (but not always) greater than 6mm - the size of a pencil eraser. E for evolution:  The spot keeps changing in size, shape, and color.  Please check your skin once per month between visits. You can use a small mirror in front and a large mirror behind you to keep an eye on the back side or your body.   If you see any new or changing lesions before your next follow-up, please call to schedule a visit.  Please continue daily skin protection including broad spectrum sunscreen SPF 30+ to sun-exposed areas, reapplying every 2 hours as needed when you're outdoors.   Staying in the shade or wearing long sleeves, sun glasses (UVA+UVB protection) and wide brim hats (4-inch brim around the entire circumference of the hat) are also recommended for sun protection.    Due to recent changes in healthcare  laws, you may see results of your pathology and/or laboratory studies on MyChart before the doctors have had a chance to review them. We understand that in some cases there may be results that are confusing or concerning to you. Please understand that not all results are received at the same time and often the doctors may need to interpret multiple results in order to provide you with the best plan of care or course of treatment. Therefore, we ask that you please give us  2 business days to thoroughly review all your results before contacting the office for clarification. Should we see a critical lab result, you will be contacted sooner.   If You Need Anything After Your Visit  If you have any questions or concerns for your doctor, please call our main line at [phone] and press option 4 to reach your doctor's medical assistant. If no  one answers, please leave a voicemail as directed and we will return your call as soon as possible. Messages left after 4 pm will be answered the following business day.   You may also send us  a message via MyChart. We typically respond to MyChart messages within 1-2 business days.  For prescription refills, please ask your pharmacy to contact our office. Our fax number is [phone].  If you have an urgent issue when the clinic is closed that cannot wait until the next business day, you can page your doctor at the number below.    Please note that while we do our best to be available for urgent issues outside of office hours, we are not available 24/7.   If you have an urgent issue and are unable to reach us , you may choose to seek medical care at your doctor's office, retail clinic, urgent care center, or emergency room.  If you have a medical emergency, please immediately call 911 or go to the emergency department.  Pager Numbers  - Dr. Hester: [phone]  - Dr. Jackquline: [phone]  - Dr. Claudene: [phone]   In the event of inclement weather,  please call our main line at [phone] for an update on the status of any delays or closures.  Dermatology Medication Tips: Please keep the boxes that topical medications come in in order to help keep track of the instructions about where and how to use these. Pharmacies typically print the medication instructions only on the boxes and not directly on the medication tubes.   If your medication is too expensive, please contact our office at [phone] option 4 or send us  a message through MyChart.   We are unable to tell what your co-pay for medications will be in advance as this is different depending on your insurance coverage. However, we may be able to find a substitute medication at lower cost or fill out paperwork to get insurance to cover a needed medication.   If a prior authorization is required to get your medication covered by your insurance company, please allow us  1-2 business days to complete this process.  Drug prices often vary depending on where the prescription is filled and some pharmacies may offer cheaper prices.  The website www.goodrx.com contains coupons for medications through different pharmacies. The prices here do not account for what the cost may be with help from insurance (it may be cheaper with your insurance), but the website can give you the price if you did not use any insurance.  - You can print the associated coupon and take it with your prescription to the pharmacy.  - You may also stop by our office during regular business hours and pick up a GoodRx coupon card.  - If you need your prescription sent electronically to a different pharmacy, notify our office through [facility] or by phone at [phone] option 4.     Si Usted Necesita Algo Despus de Su Visita  Tambin puede enviarnos un mensaje a travs de Clinical cytogeneticist. Por lo general respondemos a los mensajes de MyChart en el transcurso de 1 a 2 das hbiles.  Para renovar recetas, por favor  pida a su farmacia que se ponga en contacto con nuestra oficina. Randi lakes de fax es [place] [phone].  Si tiene un asunto urgente cuando la clnica est cerrada y que no puede esperar hasta el siguiente da hbil, puede llamar/localizar a su doctor(a) al nmero que aparece a continuacin.   Por favor, tenga en  cuenta que aunque hacemos todo lo posible para estar disponibles para asuntos urgentes fuera del horario de [place], no estamos disponibles las 24 horas del da, los 7 [address]  [address] de la [place].   Si tiene un problema urgente y no puede comunicarse con nosotros, puede optar por buscar atencin mdica  en el consultorio de su doctor(a), en una clnica privada, en un centro de atencin urgente o en una sala de emergencias.  Si tiene Engineer, drilling, por favor llame inmediatamente al 911 o vaya a la sala de emergencias.  Nmeros de bper  - Dr. Hester: [phone]  - Dra. Jackquline: [phone]  - Dr. Claudene: [phone]   En caso de inclemencias del tiempo, por favor llame a landry capes principal al [phone] para una actualizacin sobre el [place] de cualquier retraso o cierre.  Consejos para la medicacin en dermatologa: Por favor, guarde las cajas en las que vienen los medicamentos de uso tpico para ayudarle a seguir las instrucciones sobre dnde y cmo usarlos. Las farmacias generalmente imprimen las instrucciones del medicamento slo en las cajas y no directamente en los tubos del [place].   Si su medicamento es muy caro, por favor, pngase en contacto con landry rieger llamando al [phone] y presione la opcin 4 o envenos un mensaje a travs de Clinical cytogeneticist.   No podemos decirle cul ser su copago por los medicamentos por adelantado ya que esto es diferente dependiendo de la cobertura de su seguro. Sin embargo, es posible que podamos encontrar un medicamento sustituto a Audiological scientist un formulario para que el seguro cubra el medicamento que se considera  necesario.   Si se requiere una autorizacin previa para que su compaa de seguros malta su medicamento, por favor permtanos de 1 a 2 das hbiles para completar este proceso.  Los precios de los medicamentos varan con frecuencia dependiendo del Environmental consultant de dnde se surte la receta y alguna farmacias pueden ofrecer precios ms baratos.  El sitio web www.goodrx.com tiene cupones para medicamentos de Health and safety inspector. Los precios aqu no tienen en cuenta lo que podra costar con la ayuda del seguro (puede ser ms barato con su seguro), pero el sitio web puede darle el precio si no utiliz Tourist information centre manager.  - Puede imprimir el cupn correspondiente y llevarlo con su receta a la farmacia.  - Tambin puede pasar por nuestra oficina durante el horario de atencin regular y Education officer, museum una tarjeta de cupones de GoodRx.  - Si necesita que su receta se enve electrnicamente a una farmacia diferente, informe a nuestra oficina a travs de MyChart de [place] o por telfono llamando al [phone] y presione la opcin 4.

## 2023-09-06 NOTE — Progress Notes (Unsigned)
 Follow-Up Visit   Subjective  Meghan Myers is a 73 y.o. female who presents for the following: Skin Cancer Screening and Full Body Skin Exam. Patient with hx SCC, SCCIS. Rechecking AK L central lower lip.  The patient presents for Total-Body Skin Exam (TBSE) for skin cancer screening and mole check. The patient has spots, moles and lesions to be evaluated, some may be new or changing and the patient may have concern these could be cancer.  New spot on hand to check.   The following portions of the chart were reviewed this encounter and updated as appropriate: medications, allergies, medical history  Review of Systems:  No other skin or systemic complaints except as noted in HPI or Assessment and Plan.  Objective  Well appearing patient in no apparent distress; mood and affect are within normal limits.  A full examination was performed including scalp, head, eyes, ears, nose, lips, neck, chest, axillae, abdomen, back, buttocks, bilateral upper extremities, bilateral lower extremities, hands, feet, fingers, toes, fingernails, and toenails. All findings within normal limits unless otherwise noted below.   Relevant physical exam findings are noted in the Assessment and Plan.  Bilateral lower legs Pink tan macules, some with keratotic rim bilateral lower legs L central lower lip x1 Pink scaly macule L thenar hand dorsum 0.7 cm pink keratotic papule  L spinal mid back 0.6cm pink pearly papule   Assessment & Plan   SKIN CANCER SCREENING PERFORMED TODAY.  ACTINIC DAMAGE - Chronic condition, secondary to cumulative UV/sun exposure - diffuse scaly erythematous macules with underlying dyspigmentation - Recommend daily broad spectrum sunscreen SPF 30+ to sun-exposed areas, reapply every 2 hours as needed.  - Staying in the shade or wearing long sleeves, sun glasses (UVA+UVB protection) and wide brim hats (4-inch brim around the entire circumference of the hat) are also recommended for  sun protection.  - Call for new or changing lesions.  LENTIGINES, SEBORRHEIC KERATOSES, HEMANGIOMAS - Benign normal skin lesions - Benign-appearing - Call for any changes  SEBORRHEIC KERATOSIS - Stuck-on, waxy, tan-brown papules and/or plaques at L posterior thigh - Benign-appearing - Discussed benign etiology and prognosis. - Observe - Call for any changes - Recheck at f/u  LENTIGO Exam: 2 x 1 cm tan macule with telangectasia at R medial cheek Due to sun exposure Treatment Plan: Benign-appearing, observe. Recommend daily broad spectrum sunscreen SPF 30+ to sun-exposed areas, reapply every 2 hours as needed.  Call for any changes  Counseling for BBL / IPL / Laser and Coordination of Care Discussed the treatment option of Broad Band Light (BBL) /Intense Pulsed Light (IPL)/ Laser for skin discoloration, including brown spots and redness.  Typically we recommend at least 1-3 treatment sessions about 5-8 weeks apart for best results.  Cannot have tanned skin when BBL performed, and regular use of sunscreen/photoprotection is advised after the procedure to help maintain results. The patient's condition may also require maintenance treatments in the future.  The fee for BBL / laser treatments is $350 per treatment session for the whole face.  A fee can be quoted for other parts of the body.  Insurance typically does not pay for BBL/laser treatments and therefore the fee is an out-of-pocket cost. Recommend prophylactic valtrex treatment. Once scheduled for procedure, will send Rx in prior to patient's appointment.     MELANOCYTIC NEVI - Tan-brown and/or pink-flesh-colored symmetric macules and papules - Benign appearing on exam today - Observation - Call clinic for new or changing moles - Recommend  daily use of broad spectrum spf 30+ sunscreen to sun-exposed areas.   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  - Call if any new or changing lesions are noted between office visits  HISTORY OF SQUAMOUS CELL CARCINOMA OF THE SKIN Right lateral pretibia- EDC 03/09/2023, clear - No evidence of recurrence today - Recommend regular full body skin exams - Recommend daily broad spectrum sunscreen SPF 30+ to sun-exposed areas, reapply every 2 hours as needed.  - Call if any new or changing lesions are noted between office visits  Milia - tiny firm white papules at nose - type of cyst - benign - sometimes these will clear with nightly OTC adapalene/Differin 0.1% gel or retinol. - may be extracted if symptomatic - observe  ROSACEA- failed metronidazole   Exam: erythema with telangiectasias at nose  Chronic and persistent condition with duration or expected duration over one year. Condition is bothersome/symptomatic for patient. Currently flared.  Rosacea is a chronic progressive skin condition usually affecting the face of adults, causing redness and/or acne bumps. It is treatable but not curable. It sometimes affects the eyes (ocular rosacea) as well. It may respond to topical and/or systemic medication and can flare with stress, sun exposure, alcohol, exercise, topical steroids (including hydrocortisone/cortisone 10) and some foods.  Daily application of broad spectrum spf 30+ sunscreen to face is recommended to reduce flares.  Patient denies grittiness of the eyes  Treatment Plan: Discussed topical Rhofade  Recommend BBL treatment, discussed may take up to 3 treatments.   Discussed that BBL would help lentigo as well.    Counseling for BBL / IPL / Laser and Coordination of Care Discussed the treatment option of Broad Band Light (BBL) /Intense Pulsed Light (IPL)/ Laser for skin discoloration, including brown spots and redness.  Typically we  recommend at least 1-3 treatment sessions about 5-8 weeks apart for best results.  Cannot have tanned skin when BBL performed, and regular use of sunscreen/photoprotection is advised after the procedure to help maintain results. The patient's condition may also require maintenance treatments in the future.  The fee for BBL / laser treatments is $350 per treatment session for the whole face.  A fee can be quoted for other parts of the body.  Insurance typically does not pay for BBL/laser treatments and therefore the fee is an out-of-pocket cost. Recommend prophylactic valtrex treatment. Once scheduled for procedure, will send Rx in prior to patient's appointment.      Purpura - Chronic; persistent and recurrent.  Treatable, but not curable. 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  It is difficult to treat.  Recommend photoprotection and regular use of spf 30 or higher sunscreen to prevent worsening of condition and precancerous changes.  Benign-appearing.  Observation.  Call clinic for new or changing lesions.  Recommend daily use of broad spectrum spf 30+ sunscreen to sun-exposed  areas.   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 They are chronic with expected duration over 1 year. A portion of actinic keratoses will progress to squamous cell carcinoma of the skin. It is not possible to reliably predict which spots will progress to  skin cancer and so treatment is recommended to prevent development of skin cancer.  Recommend daily broad spectrum sunscreen SPF 30+ to sun-exposed areas, reapply every 2 hours as needed.  Recommend staying in the shade or wearing long sleeves, sun glasses (UVA+UVB protection) and wide brim hats (4-inch brim around the entire circumference of the hat). Call for new or changing lesions. Destruction of lesion - L central lower lip x1  Destruction method: cryotherapy   Informed consent: discussed and consent obtained   Lesion destroyed using liquid nitrogen: Yes   Region frozen until ice ball extended beyond lesion: Yes   Outcome: patient tolerated procedure well with no complications   Post-procedure details: wound care instructions given   Additional details:  Prior to procedure, discussed risks of blister formation, small wound, skin dyspigmentation, or rare scar following cryotherapy. Recommend Vaseline ointment to treated areas while healing.   NEOPLASM OF SKIN (2) L thenar hand dorsum Epidermal / dermal shaving  Lesion diameter (cm):  0.7 Informed consent: discussed and consent obtained   Patient was prepped and draped in usual sterile fashion: area prepped with alcohol. Anesthesia: the lesion was anesthetized in a standard fashion   Anesthetic:  1% lidocaine  w/ epinephrine  1-100,000 buffered w/ 8.4% NaHCO3 Instrument used: flexible razor blade   Hemostasis achieved with: pressure, aluminum chloride and electrodesiccation   Outcome: patient tolerated procedure well    Destruction of lesion  Destruction method: electrodesiccation and curettage   Informed consent: discussed and consent obtained   Curettage performed in three different directions: Yes   Electrodesiccation performed over the curetted area: Yes   Final wound size (cm):  0.9 Hemostasis achieved with:  pressure, aluminum chloride and electrodesiccation Outcome: patient tolerated procedure well with no complications    Post-procedure details: wound care instructions given   Additional details:  Mupirocin ointment and Bandaid applied   Specimen 1 - Surgical pathology Differential Diagnosis: Hypertrophic AK R/o SCC  Check Margins: No .7 cm pink keratotic papule EDC today L spinal mid back Epidermal / dermal shaving  Lesion diameter (cm):  0.6 Informed consent: discussed and consent obtained   Patient was prepped and draped in usual sterile fashion: area prepped with alcohol. Anesthesia: the lesion was anesthetized in a standard fashion   Anesthetic:  1% lidocaine  w/ epinephrine  1-100,000 buffered w/ 8.4% NaHCO3 Instrument used: flexible razor blade   Hemostasis achieved with: pressure, aluminum chloride and electrodesiccation   Outcome: patient tolerated procedure well    Destruction of lesion  Destruction method: electrodesiccation and curettage   Informed consent: discussed and consent obtained   Curettage performed in three different directions: Yes   Electrodesiccation performed over the curetted area: Yes   Final wound size (cm):  0.9 Hemostasis achieved with:  pressure, aluminum chloride and electrodesiccation Outcome: patient tolerated procedure well with no complications   Post-procedure details: wound care instructions given   Additional details:  Mupirocin ointment and Bandaid applied   Specimen 2 - Surgical pathology Differential Diagnosis: R/o BCC  Check Margins: No .6cm pink pearly papule EDC today EDC today x2 SKIN CANCER SCREENING   ACTINIC SKIN DAMAGE  LENTIGO   SEBORRHEIC KERATOSIS   HEMANGIOMA OF SKIN   NEVUS   HISTORY OF SQUAMOUS CELL CARCINOMA IN SITU (SCCIS) OF SKIN   HISTORY OF SCC (SQUAMOUS CELL CARCINOMA) OF SKIN   MILIA   ROSACEA   SENILE PURPURA (HCC)    Return in 6 months (on 03/08/2024) for w/ Dr. Jackquline, AK follow-up, ISK follow-up, recheck biopsy sites, schedule BBL.  I, Jacquelynn V. Wilfred, CMA, am acting as scribe for Rexene Jackquline, MD .   Documentation: I have reviewed the above documentation for accuracy and completeness, and I agree with the above.  Rexene Jackquline, MD

## 2023-09-08 ENCOUNTER — Ambulatory Visit: Payer: Self-pay | Admitting: Dermatology

## 2023-09-08 LAB — SURGICAL PATHOLOGY

## 2023-09-12 ENCOUNTER — Encounter: Payer: Self-pay | Admitting: Dermatology

## 2023-09-12 NOTE — Telephone Encounter (Signed)
 Advised pt of bx results/sh ?

## 2023-09-12 NOTE — Telephone Encounter (Signed)
-----   Message from Rexene Rattler sent at 09/08/2023  6:35 PM EDT ----- 1. Skin, L thenar hand dorsum :       WELL DIFFERENTIATED SQUAMOUS CELL CARCINOMA  2. Skin, L spinal mid back :       BASAL CELL CARCINOMA, NODULAR AND INFILTRATIVE PATTERNS   SCC skin cancer- already treated with EDC at time of biopsy, will recheck in 6 months  BCC skin cancer- already treated with EDC at time of biopsy, will recheck in 6 months   - please call patient ----- Message ----- From: Interface, [facility] Sent: 09/08/2023   5:35 PM EDT To: Rexene Rattler, MD

## 2023-10-18 ENCOUNTER — Telehealth: Payer: Self-pay

## 2023-10-18 NOTE — Telephone Encounter (Signed)
 Left message for patient to call regarding BBL appt tomorrow, need to discuss prophylactic Valtrex treatment.

## 2023-10-19 ENCOUNTER — Ambulatory Visit (INDEPENDENT_AMBULATORY_CARE_PROVIDER_SITE_OTHER): Admitting: Dermatology

## 2023-10-19 DIAGNOSIS — L570 Actinic keratosis: Secondary | ICD-10-CM

## 2023-10-19 DIAGNOSIS — L719 Rosacea, unspecified: Secondary | ICD-10-CM

## 2023-10-19 DIAGNOSIS — L814 Other melanin hyperpigmentation: Secondary | ICD-10-CM

## 2023-10-19 NOTE — Progress Notes (Signed)
 Follow-Up Visit   Subjective  Meghan Myers is a 73 y.o. female who presents for the following: BBL treatment, #1   The following portions of the chart were reviewed this encounter and updated as appropriate: medications, allergies, medical history  Review of Systems:  No other skin or systemic complaints except as noted in HPI or Assessment and Plan.  Objective  Well appearing patient in no apparent distress; mood and affect are within normal limits.  A focused examination was performed of the following areas: Face  Relevant physical exam findings are noted in the Assessment and Plan.  face Scattered tan macules.  face Mid face erythema with telangiectasias  L Nasal Dorsum x 1, L Nasal Tip x 1 (2) Pink scaly macules.      After BBL treatment - before photos taken, but not saved.       Assessment & Plan   LENTIGINES face Patient was given BBL starter bag and iced gel face mask.  Photorejuvenation - face Prior to the procedure, the patient's past medical history, medications, allergies, and the rare but potential risks and complications were reviewed with the patient and a signed consent was obtained.  Pre and post treatment care was discussed and instructions provided.   [place] BBL - 10/19/23 1100      Patient Details   Skin Type: I    Anesthestic Cream Applied: No    Photo Takes: Yes    Consent Signed: Yes      Treatment Details   Date: 10/19/23    Treatment #: 1    Area: face    Filter: 1st Pass;2nd Pass;3rd Pass      1st Pass   Location: Other   cheeks, nose, chin   Device: 515 Filter    BBL j/cm2: 10    PW Msec Sec: 10    Cooling Temp: 15    Pulses: 80    15x45: This crystal used.      2nd Pass   Location: Other   forehead, cheeks, chin, nose, upper lip   Device: 515 Filter    BBL j/cm2: 12    PW Msec Sec: 10    Cooling Temp: 15    Pulses: 174    15x15: This crystal used.       Patient tolerated the procedure well.   Laser  safety: Patient was advised in laser safety.  Patient was fitted with laser safety goggles and advised to keep eyes closed during procedure with goggles on. Staff and provider ensured that patient and their own safety goggles were also on and eyes protected during procedure. Laser room door was secured and locked from the inside. Laser room door has laser safety sign affixed to the outside of the door.  [place] avoidance was stressed. The patient will call with any problems, questions or concerns prior to their next appointment.    ROSACEA face Rosacea is a chronic progressive skin condition usually affecting the face of adults, causing redness and/or acne bumps. It is treatable but not curable. It sometimes affects the eyes (ocular rosacea) as well. It may respond to topical and/or systemic medication and can flare with stress, sun exposure, alcohol, exercise, topical steroids (including hydrocortisone/cortisone 10) and some foods.  Daily application of broad spectrum spf 30+ sunscreen to face is recommended to reduce flares. Photorejuvenation - face Prior to the procedure, the patient's past medical history, medications, allergies, and the rare but potential risks and complications were reviewed with the patient and  a signed consent was obtained.  Pre and post treatment care was discussed and instructions provided.   [place] BBL - 10/19/23 1100      Patient Details   Skin Type: I    Anesthestic Cream Applied: No    Photo Takes: Yes    Consent Signed: Yes      Treatment Details   Date: 10/19/23    Treatment #: 1    Area: face    Filter: 1st Pass;2nd Pass;3rd Pass      3rd Pass   Location: Other   cheeks, upper lip, chin   Device: 560 Filter    BBL j/cm2: 15    PW Msec Sec: 15    Cooling Temp: 15    Pulses: 91    15x15: This crystal used.        Patient tolerated the procedure well.   [place] avoidance was stressed. The patient will call with any problems, questions or concerns  prior to their next appointment.    AK (ACTINIC KERATOSIS) (2) L Nasal Dorsum x 1, L Nasal Tip x 1 (2) Actinic keratoses are precancerous spots that appear secondary to cumulative UV radiation exposure/sun exposure over time. They are chronic with expected duration over 1 year. A portion of actinic keratoses will progress to squamous cell carcinoma of the skin. It is not possible to reliably predict which spots will progress to skin cancer and so treatment is recommended to prevent development of skin cancer.  Recommend daily broad spectrum sunscreen SPF 30+ to sun-exposed areas, reapply every 2 hours as needed.  Recommend staying in the shade or wearing long sleeves, sun glasses (UVA+UVB protection) and wide brim hats (4-inch brim around the entire circumference of the hat). Call for new or changing lesions. Destruction of lesion - L Nasal Dorsum x 1, L Nasal Tip x 1 (2)  Destruction method: cryotherapy   Informed consent: discussed and consent obtained   Lesion destroyed using liquid nitrogen: Yes   Region frozen until ice ball extended beyond lesion: Yes   Outcome: patient tolerated procedure well with no complications   Post-procedure details: wound care instructions given   Additional details:  Prior to procedure, discussed risks of blister formation, small wound, skin dyspigmentation, or rare scar following cryotherapy. Recommend Vaseline ointment to treated areas while healing.     Return as scheduled, for 6 mo f/u.  IAndrea Kerns, CMA, am acting as scribe for Rexene Rattler, MD .   Documentation: I have reviewed the above documentation for accuracy and completeness, and I agree with the above.  Rexene Rattler, MD

## 2023-10-19 NOTE — Patient Instructions (Addendum)
 Cryotherapy Aftercare  Wash gently with soap and water everyday.   Apply Vaseline and Band-Aid daily until healed.    Due to recent changes in healthcare laws, you may see results of your pathology and/or laboratory studies on MyChart before the doctors have had a chance to review them. We understand that in some cases there may be results that are confusing or concerning to you. Please understand that not all results are received at the same time and often the doctors may need to interpret multiple results in order to provide you with the best plan of care or course of treatment. Therefore, we ask that you please give us  2 business days to thoroughly review all your results before contacting the office for clarification. Should we see a critical lab result, you will be contacted sooner.   If You Need Anything After Your Visit  If you have any questions or concerns for your doctor, please call our main line at [phone] and press option 4 to reach your doctor's medical assistant. If no one answers, please leave a voicemail as directed and we will return your call as soon as possible. Messages left after 4 pm will be answered the following business day.   You may also send us  a message via MyChart. We typically respond to MyChart messages within 1-2 business days.  For prescription refills, please ask your pharmacy to contact our office. Our fax number is [phone].  If you have an urgent issue when the clinic is closed that cannot wait until the next business day, you can page your doctor at the number below.    Please note that while we do our best to be available for urgent issues outside of office hours, we are not available 24/7.   If you have an urgent issue and are unable to reach us , you may choose to seek medical care at your doctor's office, retail clinic, urgent care center, or emergency room.  If you have a medical emergency, please immediately call 911 or go to the emergency  department.  Pager Numbers  - Dr. Hester: [phone]  - Dr. Jackquline: [phone]  - Dr. Claudene: [phone]   - Dr. Raymund: [phone]  In the event of inclement weather, please call our main line at [phone] for an update on the status of any delays or closures.  Dermatology Medication Tips: Please keep the boxes that topical medications come in in order to help keep track of the instructions about where and how to use these. Pharmacies typically print the medication instructions only on the boxes and not directly on the medication tubes.   If your medication is too expensive, please contact our office at [phone] option 4 or send us  a message through MyChart.   We are unable to tell what your co-pay for medications will be in advance as this is different depending on your insurance coverage. However, we may be able to find a substitute medication at lower cost or fill out paperwork to get insurance to cover a needed medication.   If a prior authorization is required to get your medication covered by your insurance company, please allow us  1-2 business days to complete this process.  Drug prices often vary depending on where the prescription is filled and some pharmacies may offer cheaper prices.  The website www.goodrx.com contains coupons for medications through different pharmacies. The prices here do not account for what the cost may be with help from insurance (it may be cheaper with your  insurance), but the website can give you the price if you did not use any insurance.  - You can print the associated coupon and take it with your prescription to the pharmacy.  - You may also stop by our office during regular business hours and pick up a GoodRx coupon card.  - If you need your prescription sent electronically to a different pharmacy, notify our office through [facility] or by phone at [phone] option 4.     Si Usted Necesita Algo Despus de Su  Visita  Tambin puede enviarnos un mensaje a travs de Clinical cytogeneticist. Por lo general respondemos a los mensajes de MyChart en el transcurso de 1 a 2 das hbiles.  Para renovar recetas, por favor pida a su farmacia que se ponga en contacto con nuestra oficina. Randi lakes de fax es [place] [phone].  Si tiene un asunto urgente cuando la clnica est cerrada y que no puede esperar hasta el siguiente da hbil, puede llamar/localizar a su doctor(a) al nmero que aparece a continuacin.   Por favor, tenga en cuenta que aunque hacemos todo lo posible para estar disponibles para asuntos urgentes fuera del horario de [place], no estamos disponibles las 24 horas del da, los 7 [address]  [address] de la [place].   Si tiene un problema urgente y no puede comunicarse con nosotros, puede optar por buscar atencin mdica  en el consultorio de su doctor(a), en una clnica privada, en un centro de atencin urgente o en una sala de emergencias.  Si tiene Engineer, drilling, por favor llame inmediatamente al 911 o vaya a la sala de emergencias.  Nmeros de bper  - Dr. Hester: [phone]  - Dra. Jackquline: [phone]  - Dr. Claudene: [phone]  - Dra. Kitts: [phone]  En caso de inclemencias del [place], por favor llame a nuestra lnea principal al [phone] para una actualizacin sobre el estado de cualquier retraso o cierre.  Consejos para la medicacin en dermatologa: Por favor, guarde las cajas en las que vienen los medicamentos de uso tpico para ayudarle a seguir las instrucciones sobre dnde y cmo usarlos. Las farmacias generalmente imprimen las instrucciones del medicamento slo en las cajas y no directamente en los tubos del [place].   Si su medicamento es muy caro, por favor, pngase en contacto con landry rieger llamando al [phone] y presione la opcin 4 o envenos un mensaje a travs de Clinical cytogeneticist.   No podemos decirle cul ser su copago por los medicamentos por adelantado ya que esto  es diferente dependiendo de la cobertura de su seguro. Sin embargo, es posible que podamos encontrar un medicamento sustituto a Audiological scientist un formulario para que el seguro cubra el medicamento que se considera necesario.   Si se requiere una autorizacin previa para que su compaa de seguros malta su medicamento, por favor permtanos de 1 a 2 das hbiles para completar este proceso.  Los precios de los medicamentos varan con frecuencia dependiendo del Environmental consultant de dnde se surte la receta y alguna farmacias pueden ofrecer precios ms baratos.  El sitio web www.goodrx.com tiene cupones para medicamentos de Health and safety inspector. Los precios aqu no tienen en cuenta lo que podra costar con la ayuda del seguro (puede ser ms barato con su seguro), pero el sitio web puede darle el precio si no utiliz Tourist information centre manager.  - Puede imprimir el cupn correspondiente y llevarlo con su receta a la farmacia.  - Tambin puede pasar por nuestra oficina durante el horario de atencin regular  y recoger una tarjeta de cupones de GoodRx.  - Si necesita que su receta se enve electrnicamente a una farmacia diferente, informe a nuestra oficina a travs de MyChart de [place] o por telfono llamando al [phone] y presione la opcin 4.

## 2023-11-12 ENCOUNTER — Other Ambulatory Visit: Payer: Self-pay | Admitting: Neurology

## 2024-02-24 ENCOUNTER — Ambulatory Visit
Admission: EM | Admit: 2024-02-24 | Discharge: 2024-02-24 | Disposition: A | Discharge status: Home / Self Care | Attending: Emergency Medicine | Admitting: Emergency Medicine

## 2024-02-24 DIAGNOSIS — J101 Influenza due to other identified influenza virus with other respiratory manifestations: Secondary | ICD-10-CM | POA: Diagnosis not present

## 2024-02-24 DIAGNOSIS — I10 Essential (primary) hypertension: Secondary | ICD-10-CM

## 2024-02-24 LAB — POCT INFLUENZA A/B
Influenza A, POC: POSITIVE — AB
Influenza B, POC: NEGATIVE

## 2024-02-24 LAB — POCT RAPID STREP A (OFFICE): Rapid Strep A Screen: NEGATIVE

## 2024-02-24 MED ORDER — LIDOCAINE VISCOUS HCL 2 % MT SOLN: 15.0000 mL | OROMUCOSAL | 0 refills | Status: AC | PRN

## 2024-02-24 MED ORDER — PROMETHAZINE-DM 6.25-15 MG/5ML PO SYRP: 5.0000 mL | ORAL_SOLUTION | Freq: Four times a day (QID) | ORAL | 0 refills | Status: AC | PRN

## 2024-02-24 NOTE — ED Triage Notes (Signed)
 Sore throat, cough, fatigue, body aches x 3 days. Taking dyquil.   Did a home covid test and it was negative.
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
## 2024-02-24 NOTE — Discharge Instructions (Addendum)
 Your flu test is positive for influenza A.  Strep is negative.  Use the viscous lidocaine  as directed (gargle and spit out).    Take the Promethazine  DM as directed.  Do not drive, operate machinery, drink alcohol, or perform dangerous activities while taking this medication as it may cause drowsiness.    Follow up with your primary care provider.  Go to the emergency department if you have worsening symptoms.    Your blood pressure is elevated today at 166/79; repeat 162/82.  Please have this rechecked by your primary care provider.

## 2024-03-02 ENCOUNTER — Ambulatory Visit
Admission: EM | Admit: 2024-03-02 | Discharge: 2024-03-02 | Disposition: A | Discharge status: Home / Self Care | Attending: Emergency Medicine | Admitting: Emergency Medicine

## 2024-03-02 DIAGNOSIS — J069 Acute upper respiratory infection, unspecified: Secondary | ICD-10-CM

## 2024-03-02 MED ORDER — GUAIFENESIN-CODEINE 100-10 MG/5ML PO SOLN: 5.0000 mL | Freq: Four times a day (QID) | ORAL | 0 refills | Status: AC | PRN

## 2024-03-02 MED ORDER — AMOXICILLIN-POT CLAVULANATE 875-125 MG PO TABS: 1.0000 | ORAL_TABLET | Freq: Two times a day (BID) | ORAL | 0 refills | Status: AC

## 2024-03-02 NOTE — ED Provider Notes (Signed)
 " Meghan Myers    CSN: [id number] Arrival date & time: 03/02/24  1442      History   Chief Complaint No chief complaint on file.   HPI Meghan Myers is a 74 y.o. female.   Patient presents for evaluation of fever, nasal congestion, bilateral ear pain and fullness, sore throat, productive cough and intermittent wheezing present for 12 days.  Associated diarrhea.  Diagnosed with influenza on 02/24/2024 and has seen no improvement in symptoms.  Cough becoming more prominent.  Tolerable to food and liquids but appetite is decreased.  Has attempted use of Promethazine  DM which has been ineffective, stop use of over-the-counter medications due to elevations in blood pressure.     Past Medical History:  Diagnosis Date   Alzheimer disease (HCC)    Basal cell carcinoma 09/06/2023   L spinal mid back, [facility]   Cancer (HCC) 10/2016   kidney ca   Chronic kidney disease    Depression    History of kidney stones    X1   HLD (hyperlipidemia)    HOH (hard of hearing)    wears aids   Hypertension    Liver disease    Osteoarthritis    Rheumatoid arthritis (HCC)    SCC (squamous cell carcinoma) 03/09/2023   right lateral pretibia, [facility]   Shingles    Squamous cell carcinoma of skin 01/27/2015   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   Hx of renal cell carcinoma 03/22/2017   Fracture of multiple ribs of left side 11/14/2016   Osteoarthritis 11/14/2016   Pneumothorax on left 11/14/2016   Rheumatoid arthritis (HCC) 11/14/2016   Attention deficit hyperactivity disorder (ADHD), 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   benign   BUNIONECTOMY Bilateral    CATARACT EXTRACTION W/PHACO Left 04/12/2022   Procedure: CATARACT EXTRACTION PHACO AND INTRAOCULAR LENS PLACEMENT (IOC) LEFT;  Surgeon: Myrna Adine Anes, MD;  Location: [facility];  Service: Ophthalmology;  Laterality: Left;  4.75 0:33.4   CATARACT EXTRACTION W/PHACO Right 04/26/2022   Procedure: CATARACT EXTRACTION PHACO AND INTRAOCULAR LENS PLACEMENT (IOC) RIGHT  3.19  00:25.6;  Surgeon: Myrna Adine Anes, MD;  Location: [facility];  Service: Ophthalmology;  Laterality: Right;   FACIAL FRACTURE SURGERY     FACIAL LACERATION REPAIR     FOOT SURGERY     from childhood accident   KIDNEY SURGERY     KNEE ARTHROPLASTY Left 11/26/2020   Procedure: COMPUTER ASSISTED TOTAL KNEE ARTHROPLASTY - RNFA;  Surgeon: Mardee Lynwood SQUIBB, MD;  Location: [facility];  Service: Orthopedics;  Laterality: Left;   KNEE ARTHROSCOPY Left 05/05/2020   Procedure: ARTHROSCOPY KNEE;  Surgeon: Mardee Lynwood SQUIBB, MD;  Location: [facility];  Service: Orthopedics;  Laterality: Left;  LUMBAR LAMINECTOMY/DECOMPRESSION MICRODISCECTOMY Right 02/06/2018   Procedure: LUMBAR HEMI-LAMINECTOMY AND FORAMINOTOMYWITH DISCECTOMY 1 LEVEL L4-5;  Surgeon: Bluford Standing, MD;  Location: [facility];  Service: Neurosurgery;  Laterality: Right;   ROTATOR CUFF REPAIR Right    TONSILLECTOMY     TRIGGER FINGER RELEASE Bilateral    URETHRAL DILATION     WRIST SURGERY      OB History   No obstetric history on file.      Home Medications    Prior to Admission medications  Medication Sig Start Date End Date Taking? Authorizing Provider   amLODipine  (NORVASC ) 5 MG tablet Take 5 mg by mouth daily. 01/28/20   [provider]  amoxicillin (AMOXIL) 500 MG capsule Take 2,000 mg by mouth as needed (PRIOR TO DENTAL APPOINTMENT).    [provider]  amphetamine -dextroamphetamine  (ADDERALL  XR) 25 MG 24 hr capsule Take 25 mg by mouth every morning.    [provider]  Calcium  Carbonate (CALCIUM  600 PO) Take 600 mg by mouth daily.    [provider]  celecoxib  (CELEBREX ) 100 MG capsule Take 100 mg by mouth 2 (two) times daily.    [provider]  hydrochlorothiazide  (HYDRODIURIL ) 25 MG tablet Take 25 mg by mouth daily.    [provider]  lidocaine  (XYLOCAINE ) 2 % solution Use as directed 15 mLs in the mouth or throat as needed for mouth pain (Gargle and spit out.). 02/24/24   Tate, Kelly H, NP  Multiple Vitamin (MULTIVITAMIN WITH MINERALS) TABS tablet Take 1 tablet by mouth daily.    [provider]  predniSONE  (STERAPRED UNI-PAK 21 TAB) 10 MG (21) TBPK tablet Take by mouth daily. Take 6 tabs by mouth daily  for 1 days, then 5 tabs for 1 days, then 4 tabs for 1 days, then 3 tabs for 1 days, 2 tabs for 1 days, then 1 tab by mouth daily for 1 days Patient not taking: Reported on 08/16/2023 08/01/23   Teresa Shelba SAUNDERS, NP  promethazine -dextromethorphan (PROMETHAZINE -DM) 6.25-15 MG/5ML syrup Take 5 mLs by mouth 4 (four) times daily as needed. 02/24/24   Corlis Burnard DEL, NP  rivastigmine  (EXELON ) 1.5 MG capsule TAKE 1 CAPSULE (1.5 MG TOTAL) BY MOUTH TWICE A DAY 11/14/23   Camara, Amadou, MD  rosuvastatin (CRESTOR) 5 MG tablet Take 5 mg by mouth daily.    [provider]  Sertraline  HCl 150 MG CAPS Take 150 mg by mouth daily.    [provider]    Family History Family History  Problem Relation Age of Onset   Hypertension Mother    COPD Mother    Stroke Mother    Hypertension Father    COPD Father    Heart attack Father    Breast cancer Neg Hx     Social  History Social History[1]   Allergies   Other, Meloxicam, Sulfasalazine, Diclofenac, and Nickel   Review of Systems Review of Systems  Constitutional:  Positive for fever. Negative for activity change, appetite change, chills, diaphoresis, fatigue and unexpected weight change.  HENT:  Positive for congestion, ear pain, sore throat and voice change. Negative for dental problem, drooling, ear discharge, facial swelling, hearing loss, mouth sores, nosebleeds, postnasal drip, rhinorrhea, sinus pressure, sinus pain, sneezing, tinnitus and trouble swallowing.   Respiratory:  Positive for cough and wheezing. Negative for apnea, choking, chest tightness, shortness of breath and stridor.   Musculoskeletal:  Positive for myalgias. Negative for arthralgias, back pain, gait problem, joint swelling, neck pain and neck  stiffness.     Physical Exam Triage Vital Signs ED Triage Vitals  Encounter Vitals Group     BP      Girls Systolic BP Percentile      Girls Diastolic BP Percentile      Boys Systolic BP Percentile      Boys Diastolic BP Percentile      Pulse      Resp      Temp      Temp src      SpO2      Weight      Height      Head Circumference      Peak Flow      Pain Score      Pain Loc      Pain Education      Exclude from Growth Chart    No data found.  Updated Vital Signs There were no vitals taken for this visit.  Visual Acuity Right Eye Distance:   Left Eye Distance:   Bilateral Distance:    Right Eye Near:   Left Eye Near:    Bilateral Near:     Physical Exam Constitutional:      Appearance: Normal appearance.  HENT:     Head: Normocephalic.     Right Ear: Tympanic membrane, ear canal and external ear normal.     Left Ear: Tympanic membrane, ear canal and external ear normal.     Nose: Congestion present.     Mouth/Throat:     Pharynx: No oropharyngeal exudate or posterior oropharyngeal erythema.  Cardiovascular:     Rate and Rhythm: Normal rate and  regular rhythm.     Pulses: Normal pulses.     Heart sounds: Normal heart sounds.  Pulmonary:     Effort: Pulmonary effort is normal.     Breath sounds: Normal breath sounds.     Comments: Deep congested cough witnessed Musculoskeletal:     Cervical back: Normal range of motion and neck supple.  Neurological:     Mental Status: She is alert and oriented to person, place, and time. Mental status is at baseline.      UC Treatments / Results  Labs (all labs ordered are listed, but only abnormal results are displayed) Labs Reviewed - No data to display  EKG   Radiology No results found.  Procedures Procedures (including critical care time)  Medications Ordered in UC Medications - No data to display  Initial Impression / Assessment and Plan / UC Course  I have reviewed the triage vital signs and the nursing notes.  Pertinent labs & imaging results that were available during my care of the patient were reviewed by me and considered in my medical decision making (see chart for details).  Acute URI  Patient is in no signs of distress nor toxic appearing.  Vital signs are stable.  Low suspicion for pneumonia, pneumothorax or bronchitis and therefore will defer imaging.  Etiology is influenza however persisting for 12 days without signs of improvement with progressively worsening cough empirically placed on Augmentin as well as prescribed guaifenesin codeine, PDMP reviewed low risk, declined treatment with steroids at this time.May use additional over-the-counter medications as needed for supportive care.  May follow-up with urgent care as needed if symptoms persist or worsen.   Final Clinical Impressions(s) / UC Diagnoses   Final diagnoses:  None   Discharge Instructions   None    ED Prescriptions   None    PDMP not reviewed  this encounter.     [1]  Social History Tobacco Use   Smoking status: Former    Current packs/day: 0.00    Average packs/day: 0.5 packs/day  for 4.0 years (2.0 ttl pk-yrs)    Types: Cigarettes    Start date: 10/07/1981    Quit date: 10/07/1985    Years since quitting: 38.4   Smokeless tobacco: Never  Vaping Use   Vaping status: Never Used  Substance Use Topics   Alcohol use: Yes    Alcohol/week: 7.0 standard drinks of alcohol    Types: 7 Glasses of wine per week   Drug use: No     Teresa Shelba SAUNDERS, NP 03/02/24 1759  "

## 2024-03-02 NOTE — ED Notes (Signed)
 Patient triage by provider Teresa Shelba SAUNDERS, NP

## 2024-03-02 NOTE — Discharge Instructions (Addendum)
 Given Augmentin twice daily for 7 days for treatment of bacteria causing symptoms to linger  You may use cough syrup every 6 hours but please be mindful can make you feel sleepy  For high blood pressure you may use over-the-counter Coricidin for cough and congestion  You can take Tylenol  and/or Ibuprofen as needed for fever reduction and pain relief.   For cough: honey 1/2 to 1 teaspoon (you can dilute the honey in water or another fluid).  You can also use guaifenesin and dextromethorphan for cough. You can use a humidifier for chest congestion and cough.  If you don't have a humidifier, you can sit in the bathroom with the hot shower running.      For sore throat: try warm salt water gargles, cepacol lozenges, throat spray, warm tea or water with lemon/honey, popsicles or ice, or OTC cold relief medicine for throat discomfort.   For congestion: take a daily anti-histamine like Zyrtec, Claritin, and a oral decongestant, such as pseudoephedrine.  You can also use Flonase 1-2 sprays in each nostril daily.   It is important to stay hydrated: drink plenty of fluids (water, gatorade/powerade/pedialyte, juices, or teas) to keep your throat moisturized and help further relieve irritation/discomfort.

## 2024-03-13 ENCOUNTER — Encounter: Payer: Self-pay | Admitting: Dermatology

## 2024-03-13 ENCOUNTER — Ambulatory Visit: Admitting: Dermatology

## 2024-03-13 DIAGNOSIS — Z86007 Personal history of in-situ neoplasm of skin: Secondary | ICD-10-CM | POA: Diagnosis not present

## 2024-03-13 DIAGNOSIS — D1801 Hemangioma of skin and subcutaneous tissue: Secondary | ICD-10-CM

## 2024-03-13 DIAGNOSIS — L814 Other melanin hyperpigmentation: Secondary | ICD-10-CM | POA: Diagnosis not present

## 2024-03-13 DIAGNOSIS — L565 Disseminated superficial actinic porokeratosis (DSAP): Secondary | ICD-10-CM | POA: Diagnosis not present

## 2024-03-13 DIAGNOSIS — Z85828 Personal history of other malignant neoplasm of skin: Secondary | ICD-10-CM

## 2024-03-13 DIAGNOSIS — W908XXA Exposure to other nonionizing radiation, initial encounter: Secondary | ICD-10-CM

## 2024-03-13 DIAGNOSIS — L820 Inflamed seborrheic keratosis: Secondary | ICD-10-CM

## 2024-03-13 DIAGNOSIS — L570 Actinic keratosis: Secondary | ICD-10-CM | POA: Diagnosis not present

## 2024-03-13 DIAGNOSIS — Z1283 Encounter for screening for malignant neoplasm of skin: Secondary | ICD-10-CM

## 2024-03-13 DIAGNOSIS — Z7189 Other specified counseling: Secondary | ICD-10-CM | POA: Diagnosis not present

## 2024-03-13 DIAGNOSIS — L578 Other skin changes due to chronic exposure to nonionizing radiation: Secondary | ICD-10-CM | POA: Diagnosis not present

## 2024-03-13 DIAGNOSIS — L821 Other seborrheic keratosis: Secondary | ICD-10-CM

## 2024-03-13 DIAGNOSIS — D229 Melanocytic nevi, unspecified: Secondary | ICD-10-CM

## 2024-03-13 NOTE — Patient Instructions (Signed)
 Cryotherapy Aftercare  Wash gently with soap and water everyday.   Apply Vaseline Jelly daily until healed.    Recommend daily broad spectrum sunscreen SPF 30+ to sun-exposed areas, reapply every 2 hours as needed. Call for new or changing lesions.  Staying in the shade or wearing long sleeves, sun glasses (UVA+UVB protection) and wide brim hats (4-inch brim around the entire circumference of the hat) are also recommended for sun protection.      Photodynamic Therapy- Blue or Red Light Therapy  Actinic keratoses are the dry, red scaly spots on the skin caused by sun damage. A portion of these spots can turn into skin cancer with time, and treating them can help prevent development of skin cancer.   Treatment of these spots requires removal of the defective skin cells. There are various ways to remove actinic keratoses, including freezing with liquid nitrogen, treatment with creams, or treatment with a blue light procedure in the office.   Photodynamic Therapy (PDT), also known as blue or red light therapy is an in office procedure used to treat actinic keratoses. It works by targeting precancerous cells. After treatment, these cells peel off and are replaced by healthy ones.   For your phototherapy appointment, you will have two appointments on the day of your treatment. The first appointment will be to apply a cream to the treatment area. You will leave this cream on for 1-2 hours depending on the area being treated. The second appointment will be to shine a blue or red light on the area for 16-20 minutes to kill off the precancer cells. It is common to experience a burning sensation during the treatment.  After your treatment, it will be important to keep the treated areas of skin out of the sun completely for 48-72 hours (2-3 days) to prevent having a reaction.   Common side effects include: - Burning or stinging, which may be severe and can last up to 24-72 hours after your  treatment - Scaling and crusting which may last up to 2 weeks - Redness, swelling and/or peeling which can last up to 4 weeks  To Care for Your Skin After PDT/Blue/Red Light Therapy: - Wash with soap, water and shampoo as normal. - If needed, you can use cold compresses (e.g. ice packs) for comfort - If okay with your primary care doctor, you may use analgesics such as acetaminophen  (tylenol ) every 4-6 hours, not to exceed recommended dose - You may apply Cerave Healing Ointment, Vaseline or Aquaphor as needed - If you have a lot of swelling you may take a Benadryl  to help with this (this may cause drowsiness), not to exceed recommended dose. This may increase the risk of falls in people over 65 and may slow reaction time while driving, so it is not recommended to take before driving or operating machinery. - Sun Precautions - Wear a wide brim hat for the next week if outside  - Wear a sunblock with zinc or titanium dioxide at least SPF 50 daily  If you have any questions or concerns, please call the office and ask to speak with a nurse.   Melanoma ABCDEs  Melanoma is the most dangerous type of skin cancer, and is the leading cause of death from skin disease.  You are more likely to develop melanoma if you: Have light-colored skin, light-colored eyes, or red or blond hair Spend a lot of time in the sun Tan regularly, either outdoors or in a tanning bed Have had blistering  sunburns, especially during childhood Have a close family member who has had a melanoma Have atypical moles or large birthmarks  Early detection of melanoma is key since treatment is typically straightforward and cure rates are extremely high if we catch it early.   The first sign of melanoma is often a change in a mole or a new dark spot.  The ABCDE system is a way of remembering the signs of melanoma.  A for asymmetry:  The two halves do not match. B for border:  The edges of the growth are irregular. C for color:   A mixture of colors are present instead of an even brown color. D for diameter:  Melanomas are usually (but not always) greater than 6mm - the size of a pencil eraser. E for evolution:  The spot keeps changing in size, shape, and color.  Please check your skin once per month between visits. You can use a small mirror in front and a large mirror behind you to keep an eye on the back side or your body.   If you see any new or changing lesions before your next follow-up, please call to schedule a visit.  Please continue daily skin protection including broad spectrum sunscreen SPF 30+ to sun-exposed areas, reapplying every 2 hours as needed when you're outdoors.   Staying in the shade or wearing long sleeves, sun glasses (UVA+UVB protection) and wide brim hats (4-inch brim around the entire circumference of the hat) are also recommended for sun protection.      Due to recent changes in healthcare laws, you may see results of your pathology and/or laboratory studies on MyChart before the doctors have had a chance to review them. We understand that in some cases there may be results that are confusing or concerning to you. Please understand that not all results are received at the same time and often the doctors may need to interpret multiple results in order to provide you with the best plan of care or course of treatment. Therefore, we ask that you please give us  2 business days to thoroughly review all your results before contacting the office for clarification. Should we see a critical lab result, you will be contacted sooner.   If You Need Anything After Your Visit  If you have any questions or concerns for your doctor, please call our main line at [phone] and press option 4 to reach your doctor's medical assistant. If no one answers, please leave a voicemail as directed and we will return your call as soon as possible. Messages left after 4 pm will be answered the following business day.    You may also send us  a message via MyChart. We typically respond to MyChart messages within 1-2 business days.  For prescription refills, please ask your pharmacy to contact our office. Our fax number is [phone].  If you have an urgent issue when the clinic is closed that cannot wait until the next business day, you can page your doctor at the number below.    Please note that while we do our best to be available for urgent issues outside of office hours, we are not available 24/7.   If you have an urgent issue and are unable to reach us , you may choose to seek medical care at your doctor's office, retail clinic, urgent care center, or emergency room.  If you have a medical emergency, please immediately call 911 or go to the emergency department.  Pager Numbers  -  Dr. Hester: [phone]  - Dr. Jackquline: [phone]  - Dr. Claudene: [phone]   - Dr. Raymund: [phone]  In the event of inclement weather, please call our main line at [phone] for an update on the status of any delays or closures.  Dermatology Medication Tips: Please keep the boxes that topical medications come in in order to help keep track of the instructions about where and how to use these. Pharmacies typically print the medication instructions only on the boxes and not directly on the medication tubes.   If your medication is too expensive, please contact our office at [phone] option 4 or send us  a message through MyChart.   We are unable to tell what your co-pay for medications will be in advance as this is different depending on your insurance coverage. However, we may be able to find a substitute medication at lower cost or fill out paperwork to get insurance to cover a needed medication.   If a prior authorization is required to get your medication covered by your insurance company, please allow us  1-2 business days to complete this process.  Drug prices often vary depending on where the  prescription is filled and some pharmacies may offer cheaper prices.  The website www.goodrx.com contains coupons for medications through different pharmacies. The prices here do not account for what the cost may be with help from insurance (it may be cheaper with your insurance), but the website can give you the price if you did not use any insurance.  - You can print the associated coupon and take it with your prescription to the pharmacy.  - You may also stop by our office during regular business hours and pick up a GoodRx coupon card.  - If you need your prescription sent electronically to a different pharmacy, notify our office through [facility] or by phone at [phone] option 4.     Si Usted Necesita Algo Despus de Su Visita  Tambin puede enviarnos un mensaje a travs de Clinical Cytogeneticist. Por lo general respondemos a los mensajes de MyChart en el transcurso de 1 a 2 das hbiles.  Para renovar recetas, por favor pida a su farmacia que se ponga en contacto con nuestra oficina. Randi [place] de fax es [place] [phone].  Si tiene un asunto urgente cuando la clnica est cerrada y que no puede esperar hasta el siguiente da hbil, puede llamar/localizar a su doctor(a) al nmero que aparece a continuacin.   Por favor, tenga en cuenta que aunque hacemos todo lo posible para estar disponibles para asuntos urgentes fuera del horario de [place], no estamos disponibles las 24 horas del da, los 7 [address]  [address] de la [place].   Si tiene un problema urgente y no puede comunicarse con nosotros, puede optar por buscar atencin mdica  en el consultorio de su doctor(a), en una clnica privada, en un centro de atencin urgente o en una sala de emergencias.  Si tiene engineer, drilling, por favor llame inmediatamente al 911 o vaya a la sala de emergencias.  Nmeros de bper  - Dr. Hester: [phone]  - Dra. Jackquline: [phone]  - Dr. Claudene: [phone]  - Dra. Kitts: [phone]  En  caso de inclemencias del [place], por favor llame a nuestra lnea principal al [phone] para una actualizacin sobre el estado de cualquier retraso o cierre.  Consejos para la medicacin en dermatologa: Por favor, guarde las cajas en las que vienen los medicamentos de uso tpico para ayudarle a seguir las instrucciones sobre dnde  y cmo usarlos. Las farmacias generalmente imprimen las instrucciones del medicamento slo en las cajas y no directamente en los tubos del [place].   Si su medicamento es muy caro, por favor, pngase en contacto con landry rieger llamando al [phone] y presione la opcin 4 o envenos un mensaje a travs de Clinical Cytogeneticist.   No podemos decirle cul ser su copago por los medicamentos por adelantado ya que esto es diferente dependiendo de la cobertura de su seguro. Sin embargo, es posible que podamos encontrar un medicamento sustituto a audiological scientist un formulario para que el seguro cubra el medicamento que se considera necesario.   Si se requiere una autorizacin previa para que su compaa de seguros cubra su medicamento, por favor permtanos de 1 a 2 das hbiles para completar este proceso.  Los precios de los medicamentos varan con frecuencia dependiendo del environmental consultant de dnde se surte la receta y alguna farmacias pueden ofrecer precios ms baratos.  El sitio web www.goodrx.com tiene cupones para medicamentos de health and safety inspector. Los precios aqu no tienen en cuenta lo que podra costar con la ayuda del seguro (puede ser ms barato con su seguro), pero el sitio web puede darle el precio si no utiliz tourist information centre manager.  - Puede imprimir el cupn correspondiente y llevarlo con su receta a la farmacia.  - Tambin puede pasar por nuestra oficina durante el horario de atencin regular y education officer, museum una tarjeta de cupones de GoodRx.  - Si necesita que su receta se enve electrnicamente a una farmacia diferente, informe a nuestra oficina a travs de MyChart de [facility] o por telfono llamando al [phone] y presione la opcin 4.

## 2024-03-13 NOTE — Progress Notes (Signed)
 "  Follow-Up Visit   Subjective  Meghan Myers is a 74 y.o. female who presents for the following: Skin Cancer Screening and Full Body Skin Exam  The patient presents for Total-Body Skin Exam (TBSE) for skin cancer screening and mole check. The patient has spots, moles and lesions to be evaluated, some may be new or changing and the patient may have concern these could be cancer.    The following portions of the chart were reviewed this encounter and updated as appropriate: medications, allergies, medical history  Review of Systems:  No other skin or systemic complaints except as noted in HPI or Assessment and Plan.  Objective  Well appearing patient in no apparent distress; mood and affect are within normal limits.  A full examination was performed including scalp, head, eyes, ears, nose, lips, neck, chest, axillae, abdomen, back, buttocks, bilateral upper extremities, bilateral lower extremities, hands, feet, fingers, toes, fingernails, and toenails. All findings within normal limits unless otherwise noted below.   Relevant physical exam findings are noted in the Assessment and Plan.  Bilateral lower legs Pink tan macules, some with keratotic rim bilateral lower legs L inferior clavicle x1, L shoulder x1, R lower calf x1, L lower calf x4 (7) Erythematous keratotic or waxy stuck-on papule  Assessment & Plan   SKIN CANCER SCREENING PERFORMED TODAY.  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 L thenar hand dorsum, [facility] 09/08/2023. - No evidence of recurrence today - Recommend regular full body skin exams - Recommend daily broad spectrum sunscreen SPF 30+ to sun-exposed areas, reapply every 2 hours as needed.  - Call if any new or changing lesions are noted between office visits   HISTORY OF SQUAMOUS CELL CARCINOMA OF THE SKIN Right lateral pretibia- [facility] 03/09/2023, clear - No evidence of recurrence today - Recommend regular full  body skin exams - Recommend daily broad spectrum sunscreen SPF 30+ to sun-exposed areas, reapply every 2 hours as needed.  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 [facility] 09/08/2023.  - No evidence of recurrence today - Recommend regular full body skin exams - Recommend daily broad spectrum sunscreen SPF 30+ to sun-exposed areas, reapply every 2 hours as needed.  - Call if any new or changing lesions are noted between office visits   ACTINIC DAMAGE WITH PRECANCEROUS ACTINIC KERATOSES Counseling for Topical Chemotherapy Management: Patient exhibits: - Severe, confluent actinic changes with pre-cancerous actinic keratoses that is secondary to cumulative UV radiation exposure over time - Condition that is severe; chronic, not at goal. - diffuse scaly erythematous macules and papules with underlying dyspigmentation - Discussed Prescription Field Treatment topical Chemotherapy for Severe, Chronic Confluent Actinic Changes with Pre-Cancerous Actinic Keratoses Field treatment involves treatment of an entire area of skin that has confluent Actinic Changes (Sun/ Ultraviolet light damage) and PreCancerous Actinic Keratoses by method of PhotoDynamic Therapy (PDT) and/or prescription Topical Chemotherapy agents such as 5-fluorouracil , 5-fluorouracil /calcipotriene, and/or imiquimod.  The purpose is to decrease the number of clinically evident and subclinical PreCancerous lesions to prevent progression to development of skin cancer by chemically destroying early precancer changes that may or may not be visible.  It has been shown to reduce the risk of developing skin cancer in the treated area. As a result of treatment, redness, scaling, crusting, and open sores may occur during treatment course. One or more  than one of these methods may be used and may have to be used several times to control, suppress and eliminate  the PreCancerous changes. Discussed treatment course, expected reaction, and possible side effects. - Recommend daily broad spectrum sunscreen SPF 30+ to sun-exposed areas, reapply every 2 hours as needed.  - Staying in the shade or wearing long sleeves, sun glasses (UVA+UVB protection) and wide brim hats (4-inch brim around the entire circumference of the hat) are also recommended. - Call for new or changing lesions.  - Will schedule blue light photodynamic therapy to the arms and legs with debridement.  Two treatments each area  LENTIGINES, SEBORRHEIC KERATOSES, HEMANGIOMAS - Benign normal skin lesions - Benign-appearing - Call for any changes  MELANOCYTIC NEVI - Tan-brown and/or pink-flesh-colored symmetric macules and papules - Benign appearing on exam today - Observation - Call clinic for new or changing moles - Recommend daily use of broad spectrum spf 30+ sunscreen to sun-exposed areas.  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 Condition is symptomatic/ bothersome to patient. Not currently at goal.   DSAP is a chronic inherited condition of sun-exposed skin, most commonly affecting the arms and legs.  It is difficult to treat.  Recommend photoprotection and regular use of spf 30 or higher sunscreen to prevent worsening of condition and precancerous changes.  Benign-appearing.  Observation.  Call clinic for new or changing lesions.  Recommend daily use of broad spectrum spf 30+ sunscreen to sun-exposed areas.    Discussed Cholesterol 2% Lovastatin 2% Cream 240 gm- Apply twice daily as directed to affected areas arms and legs vs PDT treatment. Patient prefers PDT treatment.  - Will schedule blue light photodynamic therapy to the arms and legs with debridement.  Two treatments each area   INFLAMED SEBORRHEIC KERATOSIS (7) L inferior clavicle x1, L shoulder  x1, R lower calf x1, L lower calf x4 (7) VS HyAKs at R lower calf and L lower calf   Symptomatic, irritating, patient would like treated. - Destruction of lesion - L inferior clavicle x1, L shoulder x1, R lower calf x1, L lower calf x4 (7)  Destruction method: cryotherapy   Informed consent: discussed and consent obtained   Lesion destroyed using liquid nitrogen: Yes   Region frozen until ice ball extended beyond lesion: Yes   Outcome: patient tolerated procedure well with no complications   Post-procedure details: wound care instructions given   Additional details:  Prior to procedure, discussed risks of blister formation, small wound, skin dyspigmentation, or rare scar following cryotherapy. Recommend Vaseline ointment to treated areas while healing.    Return in about 6 months (around 09/10/2024) for AK Follow Up, PDT Next Available, /Blue light with debridement to legs and arms.  I, Jill Parcell, CMA, am acting as scribe for Rexene Rattler, MD.   Documentation: I have reviewed the above documentation for accuracy and completeness, and I agree with the above.  Rexene Rattler, MD    "

## 2024-03-20 ENCOUNTER — Ambulatory Visit: Admitting: Dermatology

## 2024-04-03 ENCOUNTER — Telehealth: Payer: Self-pay | Admitting: Neurology

## 2024-04-03 NOTE — Telephone Encounter (Signed)
 LVM informing pt reschedule is needed MD out

## 2024-04-05 NOTE — Telephone Encounter (Signed)
 Patient's husband called and reschedule appointment on 09/04/24 at 2:45 pm

## 2024-04-16 ENCOUNTER — Ambulatory Visit

## 2024-04-16 ENCOUNTER — Encounter

## 2024-08-15 ENCOUNTER — Ambulatory Visit: Admitting: Neurology

## 2024-09-04 ENCOUNTER — Ambulatory Visit: Admitting: Neurology

## 2024-09-11 ENCOUNTER — Ambulatory Visit: Admitting: Dermatology
